# Patient Record
Sex: Female | Born: 1985 | Race: White | Hispanic: No | Marital: Married
Health system: Midwestern US, Community
[De-identification: ages and names within clinical notes are randomized; demographics above are authoritative.]

## PROBLEM LIST (undated history)

## (undated) DIAGNOSIS — Z3200 Encounter for pregnancy test, result unknown: Secondary | ICD-10-CM

## (undated) DIAGNOSIS — O020 Blighted ovum and nonhydatidiform mole: Secondary | ICD-10-CM

## (undated) DIAGNOSIS — Z01419 Encounter for gynecological examination (general) (routine) without abnormal findings: Secondary | ICD-10-CM

## (undated) MED ORDER — RHO D IMMUNE GLOBULIN 300 MCG IM SYRG
1500 unit (300 mcg) | INJECTION | Freq: Once | INTRAMUSCULAR | Status: AC
Start: ? — End: 2013-01-05

## (undated) MED ORDER — HYDROCODONE-ACETAMINOPHEN 5 MG-325 MG TAB
5-325 mg | ORAL_TABLET | ORAL | Status: DC | PRN
Start: ? — End: 2013-04-29

## (undated) MED ORDER — ONDANSETRON HCL 4 MG TAB
4 mg | ORAL_TABLET | ORAL | Status: DC | PRN
Start: ? — End: 2013-06-22

## (undated) MED ORDER — AZITHROMYCIN 250 MG TAB
250 mg | ORAL_TABLET | ORAL | Status: DC
Start: ? — End: 2012-11-11

## (undated) MED ORDER — PROGESTERONE MICRONIZED 200 MG CAP
200 mg | ORAL_CAPSULE | Freq: Every day | ORAL | Status: DC
Start: ? — End: 2013-06-22

## (undated) MED ORDER — RHO D IMMUNE GLOBULIN 300 MCG IM SYRG
1500 unit (300 mcg) | INJECTION | Freq: Once | INTRAMUSCULAR | Status: DC
Start: ? — End: 2013-01-05

---

## 2011-08-01 LAB — HM PAP SMEAR

## 2011-12-21 NOTE — Progress Notes (Addendum)
HISTORY OF PRESENT ILLNESS  Brittany Turner is a 26 y.o. female.  HPI Comments: 2 weeks  Daily    When lies down or sitting    At nighttime becomes aware of it and feels like heart is skipping      cut out caffiene  - lots of stress at job,  Getting married in November, bying a   House    Doesn't feel like she is overwhelmed    occasinal palpitations at night whiile trying to sleep and makes it harder to fall asleep.    New Patient  Pertinent negatives include no chest pain, no abdominal pain, no headaches and no shortness of breath.   Palpitations   Pertinent negatives include no diaphoresis, no fever, no malaise/fatigue, no chest pain, no claudication, no orthopnea, no PND, no abdominal pain, no nausea, no vomiting, no headaches, no back pain, no dizziness, no weakness, no cough and no shortness of breath.     History reviewed. No pertinent past medical history.  History reviewed. No pertinent past surgical history.  Family History   Problem Relation Age of Onset   ??? Cancer Brother      neuroblastoma age 103   ??? Cancer Maternal Grandfather      Brain Cancer   ??? Heart Attack Mother      ???   ??? Seizures Brother      History     Social History   ??? Marital Status: SINGLE     Spouse Name: N/A     Number of Children: N/A   ??? Years of Education: N/A     Occupational History   ??? [employer] - case worker      [employer]     Social History Main Topics   ??? Smoking status: Never Smoker    ??? Smokeless tobacco: Not on file   ??? Alcohol Use: No   ??? Drug Use: Not on file   ??? Sexually Active: Not on file     Other Topics Concern   ??? Caffeine Concern No   ??? Special Diet No     Mostly vegetarian   ??? Exercise Yes     Runs less since increased in things to do   ??? Seat Belt Yes     Social History Narrative   ??? No narrative on file       Allergies   Allergen Reactions   ??? Ceclor (Cefaclor) Rash   ??? Pcn (Penicillins) Rash     No current outpatient prescriptions on file.      Filed Vitals:    12/21/11 1018   BP: 120/70   Pulse: 76   Resp: 20    Height: 5\' 5"  (1.651 m)   Weight: 163 lb (73.936 kg)     Body mass index is 27.12 kg/(m^2).       Review of Systems   Constitutional: Negative for fever, chills, weight loss, malaise/fatigue and diaphoresis.   HENT: Negative for congestion.    Eyes: Negative for blurred vision.   Respiratory: Negative for cough and shortness of breath.    Cardiovascular: Positive for palpitations. Negative for chest pain, orthopnea, claudication, leg swelling and PND.   Gastrointestinal: Negative for heartburn, nausea, vomiting, abdominal pain, diarrhea, constipation and blood in stool.   Genitourinary: Negative for dysuria and frequency.   Musculoskeletal: Negative for myalgias, back pain and falls.   Skin: Negative for rash.   Neurological: Negative for dizziness, tingling, tremors, focal weakness, loss of consciousness,  weakness and headaches.   Endo/Heme/Allergies: Negative for environmental allergies and polydipsia. Does not bruise/bleed easily.   Psychiatric/Behavioral: Negative for depression, suicidal ideas and memory loss. The patient is not nervous/anxious and does not have insomnia.        Physical Exam   Nursing note and vitals reviewed.  Constitutional: She is oriented to person, place, and time. She appears well-developed and well-nourished. No distress.   HENT:   Head: Normocephalic and atraumatic.   Right Ear: External ear normal.   Left Ear: External ear normal.   Nose: Nose normal.   Mouth/Throat: Oropharynx is clear and moist.   Neck: Normal range of motion. Neck supple. No thyromegaly present.   Cardiovascular: Normal rate, regular rhythm, normal heart sounds and intact distal pulses.  Exam reveals no gallop and no friction rub.    No murmur heard.  Pulmonary/Chest: Effort normal and breath sounds normal. No respiratory distress. She has no wheezes. She has no rales.   Abdominal: Soft. Normal appearance and bowel sounds are normal. She exhibits no distension, no pulsatile liver, no ascites and no mass. There is  no splenomegaly or hepatomegaly. There is no tenderness. There is no rigidity, no rebound, no guarding and no CVA tenderness.   Musculoskeletal: Normal range of motion.   Lymphadenopathy:     She has no cervical adenopathy.   Neurological: She is alert and oriented to person, place, and time. She has normal reflexes. Coordination normal.   Skin: Skin is warm and dry. No rash noted. She is not diaphoretic.   Psychiatric: She has a normal mood and affect. Her behavior is normal. Judgment and thought content normal.       ASSESSMENT and PLAN  1. Heart palpitations  ELECTROCARDIOGRAM, COMPLETE   2. Malaise and fatigue  METABOLIC PANEL, COMPREHENSIVE, CBC WITH AUTOMATED DIFF, TSH, 3RD GENERATION     reviewed medications and side effects in detail  EKG: normal EKG, normal sinus rhythm.     Rate:  96    Impression: No acute changes / normal.     Sent to [facility] for a Holter Monitor.    Further workup and treatment should be done if symptoms persist, worsen or new symptoms occur. The patient will call to notify us of any problems, complications or worsening symptoms.    Follow-up Disposition: Not on File    There are no Patient Instructions on file for this visit.    Liana Gerold, MD    [facility]  [address]  [place] [address]  Phone [phone] ??? [phone]

## 2011-12-22 LAB — METABOLIC PANEL, COMPREHENSIVE
A-G Ratio: 1.8 (ref 1.1–2.5)
ALT (SGPT): 14 IU/L (ref 0–32)
AST (SGOT): 18 IU/L (ref 0–40)
Albumin: 4.6 g/dL (ref 3.5–5.5)
Alk. phosphatase: 51 IU/L (ref 25–150)
BUN/Creatinine ratio: 14 (ref 8–20)
BUN: 11 mg/dL (ref 6–20)
Bilirubin, total: 0.5 mg/dL (ref 0.0–1.2)
CO2: 22 mmol/L (ref 20–32)
Calcium: 9.4 mg/dL (ref 8.7–10.2)
Chloride: 102 mmol/L (ref 97–108)
Creatinine: 0.77 mg/dL (ref 0.57–1.00)
GFR est non-AA: 108 mL/min/{1.73_m2} (ref >59)
GLOBULIN, TOTAL: 2.5 g/dL (ref 1.5–4.5)
Glucose: 85 mg/dL (ref 65–99)
Potassium: 4.1 mmol/L (ref 3.5–5.2)
Protein, total: 7.1 g/dL (ref 6.0–8.5)
Sodium: 138 mmol/L (ref 134–144)
eGFR If African American: 124 mL/min/{1.73_m2} (ref >59)

## 2011-12-22 LAB — TSH 3RD GENERATION: TSH: 2 u[IU]/mL (ref 0.450–4.500)

## 2011-12-24 NOTE — Progress Notes (Signed)
Quick Note:    I have reviewed all lab results which are normal or stable. Please inform the patient.  Check CBC folder for results.  ______

## 2011-12-24 NOTE — Progress Notes (Signed)
Quick Note:    Per CBC reviewed by Dr. Alto Denver "normal"  ______

## 2011-12-25 NOTE — Progress Notes (Signed)
Quick Note:    Left message on patient voice mail with results. Advised patient to call back if assistance is needed.    ______

## 2011-12-27 NOTE — Telephone Encounter (Signed)
Called for results of Holter monitor.  Left message on [facility] voice mail to fax the results for dr. Alto Denver to review.

## 2011-12-27 NOTE — Telephone Encounter (Signed)
Patient called requesting results of test from Cardiac Holter Monitor.  Patient states she was seen by Dr. Alto Denver on 12/21/2011 and was prescribed a holter monitor for the weekend and was turned back in on Monday of this week.

## 2011-12-27 NOTE — Telephone Encounter (Signed)
[facility] - please call for results.

## 2011-12-31 NOTE — Telephone Encounter (Signed)
Pt again requesting results of holter monitor.

## 2011-12-31 NOTE — Telephone Encounter (Signed)
Haven't seen yet... Will look again tonite.

## 2012-01-01 NOTE — Telephone Encounter (Signed)
Per Dr. Alto Denver Holter report has been reviewed and marked " normal".  Advised to call back if pvc's worsen or continue to bothersome.

## 2012-06-20 NOTE — Progress Notes (Signed)
HISTORY OF PRESENT ILLNESS  Brittany Turner is a 26 y.o. female.       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  Other children area sick in that home    HPI Comments: Hard to breath at times - feels like congestion in chest    Started mucinex yesterday.    URI   The history is provided by the patient. This is a new problem. The current episode started 2 days ago. The problem has been gradually worsening. There has been no fever. Associated symptoms include congestion, headaches, rhinorrhea, sinus pain, sore throat and cough. Pertinent negatives include no chest pain, no abdominal pain, no diarrhea, no nausea, no vomiting, no dysuria, no ear pain, no plugged ear sensation, no sneezing, no swollen glands, no joint pain, no joint swelling, no neck pain, no rash and no wheezing. She has tried NSAIDs for the symptoms.       Review of Systems   Constitutional: Positive for malaise/fatigue. Negative for fever, chills and diaphoresis.   HENT: Positive for congestion, sore throat and rhinorrhea. Negative for ear pain, nosebleeds, sneezing, neck pain and ear discharge.    Eyes: Negative for pain and discharge.   Respiratory: Positive for cough and sputum production. Negative for hemoptysis, shortness of breath and wheezing.    Cardiovascular: Negative for chest pain, palpitations, orthopnea, claudication and leg swelling.   Gastrointestinal: Negative for heartburn, nausea, vomiting, abdominal pain, diarrhea, constipation and blood in stool.   Genitourinary: Negative for dysuria, urgency, frequency and hematuria.   Musculoskeletal: Negative for myalgias and joint pain.   Skin: Negative for rash.   Neurological: Positive for headaches. Negative for weakness.   Psychiatric/Behavioral: The patient does not have insomnia.           BP 114/70   Pulse 92   Temp(Src) 99.2 ??F (37.3 ??C) (Oral)   Resp 20   Ht 5\' 5"  (1.651 m)   Wt 175 lb (79.379 kg)   BMI 29.12 kg/m2   LMP 06/04/2012     Physical Exam   Nursing note and vitals reviewed.  Constitutional: She appears well-developed and well-nourished. No distress.   HENT:   Head: Normocephalic and atraumatic.   Right Ear: Hearing, tympanic membrane, external ear and ear canal normal.   Left Ear: Hearing, tympanic membrane, external ear and ear canal normal.   Nose: Nose normal.   Mouth/Throat: Uvula is midline, oropharynx is clear and moist and mucous membranes are normal.   Eyes: Conjunctivae and EOM are normal. Pupils are equal, round, and reactive to light.   Neck: Normal range of motion. Neck supple. No thyromegaly present.   Cardiovascular: Normal rate, regular rhythm, normal heart sounds and intact distal pulses.  Exam reveals no gallop and no friction rub.    No murmur heard.  Pulmonary/Chest: Effort normal. No accessory muscle usage. No respiratory distress. She has decreased breath sounds. She has no wheezes. She has no rhonchi. She has no rales.   Lymphadenopathy:     She has no cervical adenopathy.   Skin: Skin is warm and dry. She is not diaphoretic.   Psychiatric: She has a normal mood and affect. Her behavior is normal. Judgment and thought content normal.       ASSESSMENT and PLAN  1. Acute bronchitis  azithromycin (ZITHROMAX Z-PAK) 250 mg tablet     reviewed medications and side effects in detail    Further workup and treatment should be done  if symptoms persist, worsen or new symptoms occur. The patient will call to notify us of any problems, complications or worsening symptoms.    Follow-up Disposition: Not on File    There are no Patient Instructions on file for this visit.    Raye Sorrow, MD    [facility] [place] [address]  Phone [phone] ??? [phone]

## 2012-11-08 NOTE — Telephone Encounter (Signed)
After hours call: Pt had +UCG yesterday and c/o "bleeding like a period".  Pt denies, headache, SOB or visual disturbance.  Recommend bedrest and monitor bleeding, gave hemorrhage protocol.  If changing saturated pad < 1 hour x 2 hours pt should proceed to [facility] ED for evaluation, if bleeding persist on Monday pt should call for advise. Pt understands and agrees.

## 2012-11-10 NOTE — Progress Notes (Signed)
Spoke with patient on Saturday with +UCG and spotting.  Recommended pt call the office on Monday for advice.  Appointment was made for bloodwork today.  Quant hcg, Progesterone and Type and Rh ordered per Dr Mellody Dance.  Ultrasound scheduled for 11/11/12.

## 2012-11-11 ENCOUNTER — Encounter

## 2012-11-11 LAB — BETA HCG, QT
HCG, BETA, HCGTLT: 1 m[IU]/mL
HCG, beta, QT: 1 m[IU]/mL

## 2012-11-11 LAB — PROGESTERONE: Progesterone: 0.6 ng/mL

## 2012-11-11 LAB — BLOOD TYPE, (ABO+RH)
Rh (D): NEGATIVE
Rh Type: NEGATIVE

## 2012-11-11 NOTE — Progress Notes (Signed)
Name: Brittany Turner Date of Birth: 05/16/1986    DATE: 11/11/2012  Age: 27 y.o.       Annual Exam    Subjective:     Chief Complaint:    Chief Complaint   Patient presents with   ??? Spotting     eob       HPI: Brittany Turner is a 27 y.o. female G0 P0000 here today because she had a positive UPT and then had bleeding.  She had period March 27th and then nothing in April .  Took UPT and + then had bleeding.  Labs done and to follow up today - unfortunately HCG was negative. So will get AE which was due.     LMP: Patient's last menstrual period was 11/07/2012.      History   Sexual Activity   ??? Sexually Active: Yes -- Female partner(s)   ??? Birth Tax adviser: None     OB History    Grav Para Term Preterm Abortions TAB SAB Ect Mult Living    0                 No past medical history on file.PMH: neg    Past Surgical History   Procedure Laterality Date   ??? Hx wisdom teeth extraction       History     Social History   ??? Marital Status: MARRIED     Spouse Name: Trey Paula     Number of Children: N/A   ??? Years of Education: N/A     Occupational History   ??? [employer] - case worker      [employer]     Social History Main Topics   ??? Smoking status: Never Smoker    ??? Smokeless tobacco: Not on file   ??? Alcohol Use: No   ??? Drug Use: No   ??? Sexually Active: Yes -- Female partner(s)     Birth Control/ Protection: None     Other Topics Concern   ??? Caffeine Concern No   ??? Special Diet No     Mostly vegetarian   ??? Exercise Yes     Runs less since increased in things to do   ??? Seat Belt Yes     Social History Narrative    Never had abnormal pap    No STDs     Family History   Problem Relation Age of Onset   ??? Cancer Brother      neuroblastoma age 6   ??? Cancer Maternal Grandfather      Brain Cancer   ??? Heart Attack Mother      ???   ??? Seizures Brother        Allergies   Allergen Reactions   ??? Ceclor (Cefaclor) Rash   ??? Pcn (Penicillins) Rash     No current outpatient prescriptions on file prior to visit.     No current facility-administered  medications on file prior to visit.       Review of Systems:     Comprehensive written review of all systems was positive for weight gain    Objective:     Filed Vitals:    11/11/12 1520   BP: 136/90   Weight: 168 lb (76.204 kg)      Body mass index is 27.96 kg/(m^2).     PHYSICAL EXAM   General   WDWN, No Acute Distress; overweight   Skin   Moles - sees derm - just had mole taken  off back   Neck   No palpable masses; thyroid normal   Chest   CTA bilaterally; no wheezes, rhonchi, rales   Heart   Regular rate and rhythm; no significant murmur heard   Abdomen   Soft, Non-distended; spleen and liver normal   Extremities   No significant edema   Breast   No palpable masses; no nipple discharge or retraction   Ext. Gen Genitalia   Normal B/U/S ; no lesions   Vagina   negative   Cervix   negative   Uterus   Small, rv, nt   Adnexa   No masses   Rectal   deferred   Other          Assessment/Plan:     Encounter Diagnoses   Name Primary?   ??? Routine gynecological examination  Pap done  MVI or PNV daily  Elevated BP but very anxious    Could have been false +UPT vs chemical pregnancy.  Recommend no treatment  Ok to try again  Call with +UPT for quant to be done ASAP    Rh negative  Rhogam not indicated since HCG negative Yes       Follow-up Disposition:  Return in about 1 year (around 11/11/2013).      Signed By:  Adela Ports, MD

## 2012-11-11 NOTE — Progress Notes (Signed)
Gyn ult

## 2012-11-15 LAB — PAP (IMAGE GUIDED) W/REFL HPV ALL PATHOLOGY (507301)
.: 0
LABCORP 019018: 0

## 2012-11-15 NOTE — Progress Notes (Signed)
Quick Note:    Notify pap normal  ______

## 2012-12-02 ENCOUNTER — Telehealth

## 2012-12-02 NOTE — Progress Notes (Signed)
Pt presents for HCG quant and progesterone levels.

## 2012-12-02 NOTE — Telephone Encounter (Signed)
Pt states she had a chemical pregnancy last month & RAK told her to call for bloodwork when she had a positive pregnancy test. LMP 11/07/12 ([redacted]w[redacted]d). Per RAK, pt to come in for quant Hcg & Progesterone levels. appt scheduled for today.

## 2012-12-03 ENCOUNTER — Telehealth

## 2012-12-03 LAB — BETA HCG, QT
HCG, BETA, HCGTLT: 8 m[IU]/mL
HCG, beta, QT: 8 m[IU]/mL

## 2012-12-03 LAB — PROGESTERONE: Progesterone: 17.3 ng/mL

## 2012-12-03 NOTE — Telephone Encounter (Signed)
Spoke w/ pt & gave results. Will check w/ RAK in the am & call her back.

## 2012-12-03 NOTE — Telephone Encounter (Signed)
TC for 5/27 lab results. Hcg=8,  Progesterone = 17.3. (pt had a chemical pregnancy last month) did a recent positive PT.LMP 11/07/12 ([redacted]w[redacted]d).  Will need to check w/ RAK tomorrow am about results & when she wants Hcg level repeated. L/M for pt to call me.

## 2012-12-04 NOTE — Telephone Encounter (Signed)
Per RAK, pt to repeat quant Hcg either Monday 6/2 or Tues 6/3. L/M for pt to call for appt. Also called in Prometrium 200 mg qhs per RAK to [facility]-[phone].

## 2012-12-08 NOTE — Progress Notes (Signed)
Pt presents for HCG quant.

## 2012-12-09 ENCOUNTER — Telehealth

## 2012-12-09 LAB — BETA HCG, QT
HCG, BETA, HCGTLT: 60 m[IU]/mL
HCG, beta, QT: 60 m[IU]/mL

## 2012-12-09 NOTE — Telephone Encounter (Signed)
TC for quant Hcg results. 5/27=8,  6/2=60. Per RAK, the numbers are going up appropriately. She rec we repeat quant Hcg in 2-3 days. (Thurs or Fri) L/M for pt to call me for lab appt.  LMP 11/07/12 ([redacted]w[redacted]d) (order put into [facility])

## 2012-12-15 NOTE — Progress Notes (Signed)
Pt presents to office for quant HCG.

## 2012-12-16 LAB — BETA HCG, QT
HCG, BETA, HCGTLT: 337 m[IU]/mL
HCG, beta, QT: 337 m[IU]/mL

## 2012-12-16 NOTE — Telephone Encounter (Signed)
TC for Hcg results. 5/27=8,   6/2=60,   6/9=337. Results given to pt. Per RAK, pt to come in tomorrow for u/s and then to see her. Pt will come in at noon.

## 2012-12-17 NOTE — Progress Notes (Signed)
Name: Brittany Turner DOB: [birth year]  DATE: 12/17/2012    G/P: Brittany Turner P0000 Age: 27 y.o.  MRN: [id number]     Chief Complaint   Patient presents with   ??? Follow-up     eob ultasound     HPI:  + HCGs.  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  Had + UPT May 26th.  Have been following quants.  Have steadily increased -- 8, 60, 337.  She had had no pain and no bleeding. She has no GI symptoms.   Has been taking prometrium since first telephone call.     Allergies   Allergen Reactions   ??? Ceclor (Cefaclor) Rash   ??? Pcn (Penicillins) Rash     Current Outpatient Prescriptions   Medication Sig   ??? progesterone (PROMETRIUM) 200 mg capsule Take 1 Cap by mouth daily.   ??? PNV COMBO#47/IRON/FA #1/DHA (PNV-DHA PO) Take  by mouth.     No current facility-administered medications for this visit.     OB History    Grav Para Term Preterm Abortions TAB SAB Ect Mult Living    0                 Filed Vitals:    12/17/12 1325   BP: 118/60   Weight: 169 lb (76.658 kg)          PHYSICAL EXAM   General   WDWN   Abdomen   Soft, nontender, no masses   Extremities   negative   Breast   deferred   Ext. Genitalia   negative   Vagina   No blood, normal   Cervix   No lesions, nontender   Uterus   Slightly increased   Adnexa   No masses   Rectal   deferred   Other  Ultrasound: no IUP; no apparent ectopic; thickened endometrium; left adnexal cyst which likes a paratubal or paraovarian cyst; right ovary with small corpus luteal cyst     ASSESSMENT:    ICD-9-CM    1. Inappropriate change in quantitative hCG in early pregnancy 631.0 TOTAL HCG, QT.     PLAN:  ?? Recommend repeat HCG today  ?? Patient unsure if really wants to keep checking labs and ultrasounds; has no risk factors for ectopic; currently would see what HCG is and then consider repeat ultrasound on Monday (5 days); she was given strict ectopic precautions  ?? Call for bleeding - Rh negative and would need rhogam  ?? Continue with prometrium and PNV  ??

## 2012-12-18 LAB — BETA HCG, QT
HCG, BETA, HCGTLT: 357 m[IU]/mL
HCG, beta, QT: 357 m[IU]/mL

## 2012-12-18 NOTE — Progress Notes (Signed)
eob ult

## 2012-12-18 NOTE — Telephone Encounter (Signed)
Spoke w/ pt. She refused to come in for an appointment or u/s on Monday. Stated she will come in 2 weeks for repeat u/s & not before. Again gave strict ectopic precautions & to call or go to the ER over the weekend if she has any pain. Also discussed Rhogam needed if any bleeding. Will discuss w/ RAK on Monday when she returns.

## 2012-12-18 NOTE — Telephone Encounter (Signed)
TC for 6/11 Hcg results- 357. (6/3 Hcg=337)  Results have not gone up appropriately. Per RAK notes & after discussing w/ HAS as RAK is out of the office today & tomorrow, pt needs appt Monday 6/16 for repeat u/s & reck w/ RAK. appt scheduled for 11:30 am. L/M for pt to call me.  Will again stress ectopic precautions & to call if any bleeding as pt has blood type of A negative & would need Rhogam.

## 2012-12-22 NOTE — Progress Notes (Signed)
Quick Note:    Call Irving Burton. I know she does not want to repeat US now. But, her HCG is very abnormal. The numbers minimally changed from 6-9 to 6-11. This does NOT seem like a normal period. I recommend that she have another Ultrasound and see if we can identify a pregnancy site.  ______

## 2012-12-24 LAB — AMB POC URINE PREGNANCY TEST, VISUAL COLOR COMPARISON: HCG urine, Ql. (POC): POSITIVE

## 2012-12-24 NOTE — Progress Notes (Signed)
HISTORY OF PRESENT ILLNESS  Brittany Turner is a 27 y.o. female.  HPI  Patient presents today for a missed period. She has c/o allergies, fatigue and breast tenderness. She took a positive pregnancy test in the office today. LMP: 11/07/12. She was previously under the care of [facility]. 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 She began trying for another pregnancy and was followed closely for quants due to previous experience. Quants: 12/02/12=8, 12/08/12=60, 12/15/12=337, 12/17/12=357. She began taking progesterone on 12/03/12 and was ultrasounded on 12/17/12 due to abnormal rise in HCG. That ultrasound revealed a thickened endometrium but no IUP present. She was given strict bleeding and ectopic precautions. Per the patient, it was discussed that if this was an ectopic pregnancy that methotrexate could be administered. She presented to our office today for care because she decided she wanted a midwife. She desires a less medical approach that is not based on numbers and prefers to listen to her body. She states that due to her Catholic beliefs, she will not do methotrexate if this in fact an ectopic pregnancy. She does not desire any testing or ultrasound today.     Chief Complaint   Patient presents with   ??? Missed Menses       Allergies   Allergen Reactions   ??? Ceclor (Cefaclor) Rash   ??? Pcn (Penicillins) Rash     Current Outpatient Prescriptions   Medication Sig Dispense Refill   ??? progesterone (PROMETRIUM) 200 mg capsule Take 1 Cap by mouth daily.  30 Cap  4   ??? PNV COMBO#47/IRON/FA #1/DHA (PNV-DHA PO) Take  by mouth.       ??? HYDROcodone-acetaminophen (NORCO) 5-325 mg per tablet Take 1 Tab by mouth every four (4) hours as needed for Pain.  20 Tab  0   ??? ondansetron hcl (ZOFRAN) 4 mg tablet Take 1 Tab by mouth every four (4) hours as needed for Nausea.  20 Tab  0     History reviewed. No pertinent past medical history.   Past Surgical History   Procedure Laterality Date   ??? Hx wisdom teeth extraction       History     Social History   ??? Marital Status: MARRIED     Spouse Name: Trey Paula     Number of Children: N/A   ??? Years of Education: N/A     Occupational History   ??? [employer] - case worker      [employer]     Social History Main Topics   ??? Smoking status: Never Smoker    ??? Smokeless tobacco: Never Used   ??? Alcohol Use: No   ??? Drug Use: No   ??? Sexually Active: Yes -- Female partner(s)     Birth Control/ Protection: None     Other Topics Concern   ??? Caffeine Concern No   ??? Special Diet No     Mostly vegetarian   ??? Exercise Yes     Runs less since increased in things to do   ??? Seat Belt Yes     Social History Narrative    Never had abnormal pap    No STDs     Family History   Problem Relation Age of Onset   ??? Cancer Brother      neuroblastoma age 40   ??? Cancer Maternal Grandfather  Brain Cancer   ??? Heart Attack Mother      ???   ??? Seizures Brother      BP 118/78   Pulse 80   Ht 5\' 5"  (1.651 m)   Wt 170 lb (77.111 kg)   BMI 28.29 kg/m2   SpO2 96%   LMP 11/07/2012           Review of Systems   Constitutional: Positive for malaise/fatigue.        Allergies   HENT: Negative.    Eyes: Negative.    Respiratory: Negative.    Cardiovascular: Negative.    Gastrointestinal: Negative.    Genitourinary: Negative.         Breast tenderness   Musculoskeletal: Negative.    Skin: Negative.    Neurological: Negative.    Endo/Heme/Allergies: Negative.    Psychiatric/Behavioral: Negative.        Physical Exam   Nursing note and vitals reviewed.  Constitutional: She is oriented to person, place, and time. She appears well-developed and well-nourished. No distress.   HENT:   Head: Normocephalic and atraumatic.   Eyes: Right eye exhibits no discharge. Left eye exhibits no discharge. No scleral icterus.   Neck: Normal range of motion.   Cardiovascular: Normal rate.    Pulmonary/Chest: Effort normal. No respiratory distress.   Musculoskeletal: Normal range  of motion.   Neurological: She is alert and oriented to person, place, and time. She exhibits normal muscle tone. Coordination normal.   Skin: Skin is warm and dry. She is not diaphoretic.   Psychiatric: She has a normal mood and affect. Her behavior is normal. Judgment and thought content normal.       ASSESSMENT and PLAN  Greater than 50% of 30 minutes spent on face to face counseling and education regarding chemical pregnancies, miscarriages, and ectopic pregnancies. [name] has been counseled to seek medical attention for heavy bleeding (especially more than 1 pad in an hour), abdominal pain, fever, N/V, or other signs and symptoms that are not normal to a healthy pregnancy. She understands the risks of ectopic pregnancy or delayed care can result in hemorrhage, losing her fallopian tube, or death. She states that she feels well with "normal" pregnancy symptoms at this time and assures me that she will follow up if any of the above issues present. I did discuss with her that a follow up ultrasound is recommended. I do not need to repeat HCG levels at this time since the follow up for an abnormal HCG rise would be ultrasound. She states that she is comfortable having an ultrasound in one week and following up prior if needed. She will continue her progesterone.     Encounter Diagnoses   Name Primary?   ??? Missed period Yes   ??? History of miscarriage, currently pregnant      Orders Placed This Encounter   ??? AMB POC URINE PREGNANCY TEST, VISUAL COLOR COMPARISON     Follow-up Disposition:  Return in about 1 week (around 01/11/2013), or if symptoms worsen or fail to improve, for ultrasound and visit.

## 2012-12-24 NOTE — Patient Instructions (Addendum)
MyChart Activation    Thank you for requesting access to MyChart. Please follow the instructions below to securely access and download your online medical record. MyChart allows you to send messages to your doctor, view your test results, renew your prescriptions, schedule appointments, and more.    How Do I Sign Up?    1. In your internet browser, go to [email or web address]  2. Click on the First Time User? Click Here link in the Sign In box. You will be redirect to the New Member Sign Up page.  3. Enter your MyChart Access Code exactly as it appears below. You will not need to use this code after you???ve completed the sign-up process. If you do not sign up before the expiration date, you must request a new code.    MyChart Access Code: Not generated  Current MyChart Status: Active (This is the date your MyChart access code will expire)    4. Enter the last four digits of your Social Security Number (xxxx) and Date of Birth (mm/dd/yyyy) as indicated and click Submit. You will be taken to the next sign-up page.  5. Create a MyChart ID. This will be your MyChart login ID and cannot be changed, so think of one that is secure and easy to remember.  6. Create a MyChart password. You can change your password at any time.  7. Enter your Password Reset Question and Answer. This can be used at a later time if you forget your password.   8. Enter your e-mail address. You will receive e-mail notification when new information is available in MyChart.  9. Click Sign Up. You can now view and download portions of your medical record.  10. Click the Download Summary menu link to download a portable copy of your medical information.    Additional Information    If you have questions, please visit the Frequently Asked Questions section of the MyChart website at [email or web address] Remember, MyChart is NOT to be used for urgent needs. For medical emergencies, dial 911.      Pregnancy Precautions: After Your  Visit  Your Care Instructions  There is no sure way to prevent labor before your due date (preterm labor) or to prevent most other pregnancy problems. But there are things you can do to increase your chances of a healthy pregnancy. Go to your appointments, follow your doctor's advice, and take good care of yourself. Eat well, and exercise (if your doctor agrees). And make sure to drink plenty of water.  Follow-up care is a key part of your treatment and safety. Be sure to make and go to all appointments, and call your doctor if you are having problems. It's also a good idea to know your test results and keep a list of the medicines you take.  How can you care for yourself at home?  ?? Make sure you go to your prenatal appointments. At each visit, your doctor will check your blood pressure. Your doctor will also check to see if you have protein in your urine. High blood pressure and protein in urine are signs of preeclampsia. This condition can be dangerous for you and your baby.  ?? Drink plenty of fluids, enough so that your urine is light yellow or clear like water. Dehydration can cause contractions. If you have kidney, heart, or liver disease and have to limit fluids, talk with your doctor before you increase the amount of fluids you drink.  ?? Tell your doctor right away if you notice  any symptoms of an infection, such as:  ?? Burning when you urinate.  ?? A foul-smelling discharge from your vagina.  ?? Vaginal itching.  ?? Unexplained fever.  ?? Unusual pain or soreness in your uterus or lower belly.  ?? Eat a balanced diet. Include plenty of foods that are high in calcium and iron.  ?? Foods high in calcium include milk, cheese, yogurt, almonds, and broccoli.  ?? Foods high in iron include red meat, shellfish, poultry, eggs, beans, raisins, whole-grain bread, and leafy green vegetables.  ?? Do not smoke. If you need help quitting, talk to your doctor about stop-smoking programs and medicines. These can increase your  chances of quitting for good.  ?? Do not drink alcohol or use illegal drugs.  ?? Follow your doctor's directions about activity. Your doctor will let you know how much, if any, exercise you can do.  ?? Ask your doctor if you can have sex. If you are at risk for early labor, your doctor may ask you to not have sex.  ?? Take care to prevent falls. During pregnancy, your joints are loose, and your balance is off. Sports such as bicycling, skiing, or in-line skating can increase your risk of falling. And don't ride horses or motorcycles, dive, water ski, scuba dive, or parachute jump while you are pregnant.  ?? Avoid getting very hot. Do not use saunas or hot tubs. Avoid staying out in the sun in hot weather for long periods. Take acetaminophen (Tylenol) to lower a high fever.  ?? Do not take any over-the-counter or herbal medicines or supplements without talking to your doctor or pharmacist first.  When should you call for help?  Call 911 anytime you think you may need emergency care. For example, call if:  ?? You passed out (lost consciousness).  ?? You have severe vaginal bleeding.  ?? You have severe pain in your belly or pelvis.  ?? You have had fluid gushing or leaking from your vagina and you know or think the umbilical cord is bulging into your vagina. If this happens, immediately get down on your knees so your rear end (buttocks) is higher than your head. This will decrease the pressure on the cord until help arrives.  Call your doctor now or seek immediate medical care if:  ?? You have signs of preeclampsia, such as:  ?? Sudden swelling of your face, hands, or feet.  ?? New vision problems (such as dimness or blurring).  ?? A severe headache.  ?? You have any vaginal bleeding.  ?? You have belly pain or cramping.  ?? You have a fever.  ?? You have had regular contractions (with or without pain) for an hour. This means that you have 8 or more within 1 hour or 4 or more in 20 minutes after you change your position and drink  fluids.  ?? You have a sudden release of fluid from your vagina.  ?? You have low back pain or pelvic pressure that does not go away.  ?? You notice that your baby has stopped moving or is moving much less than normal.  Watch closely for changes in your health, and be sure to contact your doctor if you have any problems.   Where can you learn more?   Go to [email or web address]  Enter Y951 in the search box to learn more about "Pregnancy Precautions: After Your Visit."   ?? 2006-2014 [employer]. Care instructions adapted under license by [employer] (which disclaims liability  or warranty for this information). This care instruction is for use with your licensed healthcare professional. If you have questions about a medical condition or this instruction, always ask your healthcare professional. [employer], Incorporated disclaims any warranty or liability for your use of this information.  Content Version: 10.0.270728; Last Revised: Nov 16, 2011      Weeks 6 to 10 of Your Pregnancy: After Your Visit  Your Care Instructions  Congratulations on your pregnancy. This is an exciting and important time for you.  During the first 6 to 10 weeks of your pregnancy, your body goes through many changes. Your baby grows very fast, even though you cannot feel it yet. You may start to notice that you feel different, both in your body and your emotions. Because each woman's pregnancy is unique, there is no right way to feel. You may feel the healthiest you have ever been, or you may feel tired or sick to your stomach ("morning sickness").  These early weeks are a time to make healthy choices and to eat the best foods for you and your baby. This care sheet will give you some ideas.  This is also a good time to think about birth defects testing. These are tests done during pregnancy to look for possible problems with the baby. First trimester tests for birth defects can be done between 10 and 13 weeks of  pregnancy, depending on the test. Talk with your doctor about what kinds of tests are available.  Follow-up care is a key part of your treatment and safety. Be sure to make and go to all appointments, and call your doctor if you are having problems. It's also a good idea to know your test results and keep a list of the medicines you take.  How can you care for yourself at home?  Eat well  ?? Eat at least 3 meals and 2 healthy snacks every day. Eat fresh, whole foods, including:  ?? 7 or more servings of bread, tortillas, cereal, rice, pasta, or oatmeal.  ?? 3 or more servings of vegetables, especially leafy green vegetables.  ?? 2 or more servings of fruits.  ?? 3 or more servings of milk, yogurt, or cheese.  ?? 2 or more servings of meat, [place], chicken, fish, eggs, or dried beans.  ?? Drink plenty of fluids, especially water. Avoid sodas and other sweetened drinks.  ?? Choose foods that have important vitamins for your baby, such as calcium, iron, and folate.  ?? Dairy products, tofu, canned fish with bones, almonds, broccoli, dark leafy greens, corn tortillas, and fortified orange juice are good sources of calcium.  ?? Beef, poultry, liver, spinach, lentils, dried beans, fortified cereals, and dried fruits are rich in iron.  ?? Dark leafy greens, broccoli, asparagus, liver, fortified cereals, orange juice, peanuts, and almonds are good sources of folate.  ?? Avoid foods that could harm your baby.  ?? Do not eat raw or undercooked meat, chicken, or fish (such as sushi or raw oysters).  ?? Do not eat raw eggs or foods that contain raw eggs, such as Caesar dressing.  ?? Do not eat soft cheeses and unpasteurized dairy foods, such as Brie, feta, or blue cheese.  ?? Do not eat fish that contains a lot of mercury, such as shark, swordfish, tilefish, or king mackerel. Do not eat more than one small can of tuna each week.  ?? Do not eat raw sprouts, especially alfalfa sprouts.  ?? Cut down on caffeine, such as  coffee, tea, and cola.   Protect yourself and your baby  ?? Do not touch kitty litter or cat feces. They can cause an infection that could harm your baby.  ?? High body temperature can be harmful to your baby. So if you want to use a sauna or hot tub, be sure to talk to your doctor about how to use it safely.  Cope with morning sickness  ?? Sip small amounts of water, juices, or shakes. Try drinking between meals, not with meals.  ?? Eat 5 or 6 small meals a day. Try dry toast or crackers when you first get up, and eat breakfast a little later.  ?? Avoid spicy, greasy, and fatty foods.  ?? When you feel sick, open your windows or go for a short walk to get fresh air.  ?? Try nausea wristbands. These help some women.  ?? Tell your doctor if you think your prenatal vitamins make you sick.   Where can you learn more?   Go to [email or web address]  Enter G112 in the search box to learn more about "Weeks 6 to 10 of Your Pregnancy: After Your Visit."   ?? 2006-2014 [employer], [employer]. Care instructions adapted under license by [employer] (which disclaims liability or warranty for this information). This care instruction is for use with your licensed healthcare professional. If you have questions about a medical condition or this instruction, always ask your healthcare professional. [employer]wise, Incorporated disclaims any warranty or liability for your use of this information.  Content Version: 10.0.270728; Last Revised: January 18, 2011

## 2012-12-30 NOTE — Telephone Encounter (Signed)
Pt L/M stating she won't come in for an u/s & is transferring to another practice.

## 2012-12-30 NOTE — Telephone Encounter (Signed)
L/M again for pt. Per RAK, she strongly rec pt come in for another u/s as her Hcg levels are quite abnormal. L/M for pt to call for appt. Stressed importance of trying to identify pregnancy site.

## 2013-01-02 LAB — AMB POC URINE PREGNANCY TEST, VISUAL COLOR COMPARISON: HCG urine, Ql. (POC): POSITIVE

## 2013-01-02 NOTE — Patient Instructions (Addendum)
MyChart Activation    Thank you for requesting access to MyChart. Please follow the instructions below to securely access and download your online medical record. MyChart allows you to send messages to your doctor, view your test results, renew your prescriptions, schedule appointments, and more.    How Do I Sign Up?    1. In your internet browser, go to [email or web address]  2. Click on the First Time User? Click Here link in the Sign In box. You will be redirect to the New Member Sign Up page.  3. Enter your MyChart Access Code exactly as it appears below. You will not need to use this code after you???ve completed the sign-up process. If you do not sign up before the expiration date, you must request a new code.    MyChart Access Code: Not generated  Current MyChart Status: Active (This is the date your MyChart access code will expire)    4. Enter the last four digits of your Social Security Number (xxxx) and Date of Birth (mm/dd/yyyy) as indicated and click Submit. You will be taken to the next sign-up page.  5. Create a MyChart ID. This will be your MyChart login ID and cannot be changed, so think of one that is secure and easy to remember.  6. Create a MyChart password. You can change your password at any time.  7. Enter your Password Reset Question and Answer. This can be used at a later time if you forget your password.   8. Enter your e-mail address. You will receive e-mail notification when new information is available in MyChart.  9. Click Sign Up. You can now view and download portions of your medical record.  10. Click the Download Summary menu link to download a portable copy of your medical information.    Additional Information    If you have questions, please visit the Frequently Asked Questions section of the MyChart website at [email or web address] Remember, MyChart is NOT to be used for urgent needs. For medical emergencies, dial 911.    Blighted Ovum: After Your Visit  Your  Care Instructions  A blighted ovum occurs when a fertilized egg attaches to the inside of the uterus but does not develop into a baby. It is also known as an anembryonic pregnancy. It is usually caused by a mistake in the material of the egg or sperm or the combination of both. Stress, exercise, or sex does not cause this problem. There is nothing you could have done to prevent it.  You are likely to miscarry???pass the ovum tissue???by the end of your first trimester. Medicine may be used to help the tissue pass. A process called dilation and curettage (D&C) is sometimes used to remove the tissue.  Your body will recover over the next several weeks. Having a miscarriage does not mean that you cannot have a normal pregnancy in the future.  Follow-up care is a key part of your treatment and safety. Be sure to make and go to all appointments, and call your doctor if you are having problems. It???s also a good idea to know your test results and keep a list of the medicines you take.  How can you care for yourself at home?  ?? You will probably have vaginal bleeding, similar to a period, for up to a week. Use pads instead of tampons. You may use tampons during your next period, which should start in 3 to 6 weeks.  ?? Take an over-the-counter pain medicine, such as acetaminophen (  Tylenol), ibuprofen (Advil, Motrin), or naproxen (Aleve) for cramps. Read and follow all instructions on the label. You may have cramps for several days after the miscarriage.  ?? Do not take two or more pain medicines at the same time unless the doctor told you to. Many pain medicines have acetaminophen, which is Tylenol. Too much acetaminophen (Tylenol) can be harmful.  ?? Your doctor may want you to collect tissue that you might pass. Use a clear container. Take it to your doctor's office as soon as you can.  ?? Do not have sex until the bleeding stops.  ?? You may return to your normal activities if you feel well enough to do so. But you should avoid  heavy exercise until the bleeding stops.  ?? If you plan to get pregnant again, check with your doctor. Most doctors suggest waiting until you have had at least one normal period before you try to get pregnant.  ?? If you do not want to get pregnant, ask your doctor about birth control. You can get pregnant again before your next period starts if you are not using birth control.  ?? You may be low in iron because of blood loss. Eat a balanced diet that is high in iron and vitamin C. Foods rich in iron include red meat, shellfish, eggs, beans, and leafy green vegetables. Talk to your doctor about whether you need to take iron pills or a multivitamin.  ?? The loss of a pregnancy can be very hard. You may wonder why it happened and blame yourself. Talking to family members, friends, a Veterinary surgeon, or your doctor may help you cope with your loss.  When should you call for help?  Call 911 anytime you think you may need emergency care. For example, call if:  ?? You passed out (lost consciousness).  ?? You have sudden, severe pain in your belly or pelvis.  Call your doctor now or seek immediate medical care if:  ?? You have severe vaginal bleeding. You are passing clots of blood and soaking through your usual pads every hour for 2 or more hours.  ?? You are dizzy or lightheaded, or you feel like you may faint.  ?? You have a fever.  ?? You have new or increased belly pain.  ?? You have vaginal discharge that smells bad.  Watch closely for changes in your health, and be sure to contact your doctor if:  ?? You pass tissue (not just blood).  ?? You feel very sad or are not able to function.   Where can you learn more?   Go to [email or web address]  Enter [phone] in the search box to learn more about "Blighted Ovum: After Your Visit."   ?? 2006-2014 [employer], [employer]. Care instructions adapted under license by [employer] (which disclaims liability or warranty for this information). This care instruction is for use with  your licensed healthcare professional. If you have questions about a medical condition or this instruction, always ask your healthcare professional. [employer], [employer] disclaims any warranty or liability for your use of this information.  Content Version: 10.0.270728; Last Revised: May 23, 2012

## 2013-01-02 NOTE — Progress Notes (Signed)
HISTORY OF PRESENT ILLNESS  Brittany Turner is a 27 y.o. female.  HPI  Brittany Turner presents today with her husband for ultrasound to confirm status of current pregnancy. Brittany Turner had light vaginal bleeding last night and spoke via telephone with Dr. Wilhemina Bonito. She was given precautions when to go to the ER but her symptoms have not increased and she was able to wait until her visit today. Please see previous note from Jasper's last visit.     Chief Complaint   Patient presents with   ??? Abnormal Lab Results       Allergies   Allergen Reactions   ??? Ceclor (Cefaclor) Rash   ??? Pcn (Penicillins) Rash     Current Outpatient Prescriptions   Medication Sig Dispense Refill   ??? HYDROcodone-acetaminophen (NORCO) 5-325 mg per tablet Take 1 Tab by mouth every four (4) hours as needed for Pain.  20 Tab  0   ??? ondansetron hcl (ZOFRAN) 4 mg tablet Take 1 Tab by mouth every four (4) hours as needed for Nausea.  20 Tab  0   ??? progesterone (PROMETRIUM) 200 mg capsule Take 1 Cap by mouth daily.  30 Cap  4   ??? PNV COMBO#47/IRON/FA #1/DHA (PNV-DHA PO) Take  by mouth.         History reviewed. No pertinent past medical history.  Past Surgical History   Procedure Laterality Date   ??? Hx wisdom teeth extraction       History     Social History   ??? Marital Status: MARRIED     Spouse Name: Trey Paula     Number of Children: N/A   ??? Years of Education: N/A     Occupational History   ??? [employer] - case worker      [employer]     Social History Main Topics   ??? Smoking status: Never Smoker    ??? Smokeless tobacco: Never Used   ??? Alcohol Use: No   ??? Drug Use: No   ??? Sexually Active: Yes -- Female partner(s)     Birth Control/ Protection: None     Other Topics Concern   ??? Caffeine Concern No   ??? Special Diet No     Mostly vegetarian   ??? Exercise Yes     Runs less since increased in things to do   ??? Seat Belt Yes     Social History Narrative    Never had abnormal pap    No STDs     Family History   Problem Relation Age of Onset   ??? Cancer Brother      neuroblastoma age 64   ???  Cancer Maternal Grandfather      Brain Cancer   ??? Heart Attack Mother      ???   ??? Seizures Brother      BP 128/80   Pulse 103   Ht 5\' 5"  (1.651 m)   Wt 168 lb (76.204 kg)   BMI 27.96 kg/m2   SpO2 100%   LMP 11/07/2012           Review of Systems   Constitutional: Negative.    HENT: Negative.    Eyes: Negative.    Respiratory: Negative.    Cardiovascular: Negative.    Gastrointestinal: Negative.    Genitourinary: Negative.         Vaginal bleeding     Musculoskeletal: Negative.    Skin: Negative.    Neurological: Negative.    Endo/Heme/Allergies: Negative.  Psychiatric/Behavioral: Negative.        Physical Exam   Nursing note and vitals reviewed.  Constitutional: She is oriented to person, place, and time. She appears well-developed and well-nourished. No distress.   HENT:   Head: Normocephalic and atraumatic.   Eyes: Right eye exhibits no discharge. Left eye exhibits no discharge. No scleral icterus.   Neck: Normal range of motion.   Cardiovascular: Normal rate.    Pulmonary/Chest: Effort normal. No respiratory distress.   Abdominal: Soft. She exhibits no mass. There is no tenderness. There is no rebound and no guarding.   Genitourinary: Pelvic exam was performed with patient supine. Uterus is not deviated, not enlarged, not fixed and not tender. Right adnexum displays no mass, no tenderness and no fullness. Left adnexum displays no mass, no tenderness and no fullness. There is bleeding around the vagina. No erythema or tenderness around the vagina. No foreign body around the vagina. No signs of injury around the vagina. No vaginal discharge found.   Cervix closed   Musculoskeletal: Normal range of motion.   Neurological: She is alert and oriented to person, place, and time. She exhibits normal muscle tone. Coordination normal.   Skin: Skin is warm and dry. She is not diaphoretic.   Psychiatric: She has a normal mood and affect. Her behavior is normal. Judgment and thought content normal.       ASSESSMENT and  PLAN  Dorthie and her husband were counseled regarding the ultrasound findings. This is a blighted ovum pregnancy where the embryo did not develop. The gestational sac seen on ultrasound is not consistent with her previous HCG levels or her LMP which would make her 8w gestation. Due to the sac being low on the endometrium and vaginal bleeding already present, it is likely that Brittany Turner is in the process of miscarriage. She has been counseled that she can allow her body to miscarry on it's own or she can elect to have a D&C and that procedure was explained. She will see how she does throughout the weekend. Pain medication and nausea medication were prescribed and given to the patient. She is to contact me on Monday and let me know how she is doing. If she has not completed her miscarriage, she can elect to have a D&C at any point and she is aware of this. She was given the same precautions to seek care for saturating a pad each hour or unrelieved abdominal pain or signs of infection such as fever, N/V, etc. She and her husband were counseled that after a miscarriage is complete, she can elect to have a blood HCG level checked to ensure that it returns to zero. In rare instances, chorio-carcinoma can occur and not achieving an HCG of zero would raise suspicion for this. She has been advised to postpone trying for another pregnancy until her cycle resets over the next 1-3 months but Brittany Turner is Catholic and does not prevent conception.   Greater than 50% of 25 minutes spent on face to face counseling, education, and examining the patient regarding abnormal pregnancy findings listed above. Questions were encouraged and answered. This case was reviewed with Dr. Wilhemina Bonito and she spoke with the Brittany Turner and her husband prior to them leaving the office.      Encounter Diagnoses   Name Primary?   ??? Threatened abortion in early pregnancy Yes   ??? Missed menses      Orders Placed This Encounter   ??? AMB POC [place], TRANSVAGINAL   ???  AMB POC URINE  PREGNANCY TEST, VISUAL COLOR COMPARISON   ??? HYDROcodone-acetaminophen (NORCO) 5-325 mg per tablet   ??? ondansetron hcl (ZOFRAN) 4 mg tablet     Follow-up Disposition:  Return if symptoms worsen or fail to improve, for call on Monday.

## 2013-01-05 NOTE — Progress Notes (Signed)
Patient here for Rhogam injection, tolerated well. She states she had very heavy bleeding over the weekend and feels like she passed the pregnancy. Today she is bleeding but lighter more like a period. She will continue to monitor.

## 2013-01-22 ENCOUNTER — Encounter

## 2013-04-17 LAB — TSH 3RD GENERATION: TSH, External: 1.78

## 2013-04-17 LAB — HEMATOCRIT: Hct, External: 43.3

## 2013-04-17 LAB — PLATELET COUNT: Platelet cnt., External: 268

## 2013-04-17 LAB — HIV-1 AB WESTERN BLOT CONFIRM ONLY

## 2013-04-17 LAB — HEMOGLOBIN: Hgb, External: 14.2

## 2013-04-17 LAB — URINALYSIS W/O MICRO

## 2013-04-17 LAB — RPR

## 2013-04-17 LAB — BLOOD TYPE, (ABO+RH)
ABO,Rh: A NEG
TYPE, ABO & RH, EXTERNAL: A NEG

## 2013-04-17 LAB — HEPATITIS C AB: Hepatitis C Ab, External: NEGATIVE

## 2013-04-17 LAB — ANTIBODY SCREEN: Antibody screen, External: NEGATIVE

## 2013-04-17 LAB — RUBELLA AB, IGG: Rubella, External: IMMUNE

## 2013-04-17 LAB — HEPATITIS B SURFACE AG W/REFLEX: HBsAg, External: NEGATIVE

## 2013-04-17 LAB — HEPATITIS C ANTIBODY: HEPATITIS C AB,   EXT: NEGATIVE

## 2013-04-17 NOTE — Patient Instructions (Addendum)
Weeks 6 to 10 of Your Pregnancy: After Your Visit  Your Care Instructions     Congratulations on your pregnancy. This is an exciting and important time for you.  During the first 6 to 10 weeks of your pregnancy, your body goes through many changes. Your baby grows very fast, even though you cannot feel it yet. You may start to notice that you feel different, both in your body and your emotions. Because each woman's pregnancy is unique, there is no right way to feel. You may feel the healthiest you have ever been, or you may feel tired or sick to your stomach ("morning sickness").  These early weeks are a time to make healthy choices and to eat the best foods for you and your baby. This care sheet will give you some ideas.  This is also a good time to think about birth defects testing. These are tests done during pregnancy to look for possible problems with the baby. First trimester tests for birth defects can be done between 10 and 13 weeks of pregnancy, depending on the test. Talk with your doctor about what kinds of tests are available.  Follow-up care is a key part of your treatment and safety. Be sure to make and go to all appointments, and call your doctor if you are having problems. It's also a good idea to know your test results and keep a list of the medicines you take.  How can you care for yourself at home?  Eat well  ?? Eat at least 3 meals and 2 healthy snacks every day. Eat fresh, whole foods, including:  ?? 7 or more servings of bread, tortillas, cereal, rice, pasta, or oatmeal.  ?? 3 or more servings of vegetables, especially leafy green vegetables.  ?? 2 or more servings of fruits.  ?? 3 or more servings of milk, yogurt, or cheese.  ?? 2 or more servings of meat, turkey, chicken, fish, eggs, or dried beans.  ?? Drink plenty of fluids, especially water. Avoid sodas and other sweetened drinks.  ?? Choose foods that have important vitamins for your baby, such as calcium, iron, and folate.  ?? Dairy products,  tofu, canned fish with bones, almonds, broccoli, dark leafy greens, corn tortillas, and fortified orange juice are good sources of calcium.  ?? Beef, poultry, liver, spinach, lentils, dried beans, fortified cereals, and dried fruits are rich in iron.  ?? Dark leafy greens, broccoli, asparagus, liver, fortified cereals, orange juice, peanuts, and almonds are good sources of folate.  ?? Avoid foods that could harm your baby.  ?? Do not eat raw or undercooked meat, chicken, or fish (such as sushi or raw oysters).  ?? Do not eat raw eggs or foods that contain raw eggs, such as Caesar dressing.  ?? Do not eat soft cheeses and unpasteurized dairy foods, such as Brie, feta, or blue cheese.  ?? Do not eat fish that contains a lot of mercury, such as shark, swordfish, tilefish, or king mackerel. Do not eat more than one small can of tuna each week.  ?? Do not eat raw sprouts, especially alfalfa sprouts.  ?? Cut down on caffeine, such as coffee, tea, and cola.  Protect yourself and your baby  ?? Do not touch kitty litter or cat feces. They can cause an infection that could harm your baby.  ?? High body temperature can be harmful to your baby. So if you want to use a sauna or hot tub, be sure to talk   to your doctor about how to use it safely.  Cope with morning sickness  ?? Sip small amounts of water, juices, or shakes. Try drinking between meals, not with meals.  ?? Eat 5 or 6 small meals a day. Try dry toast or crackers when you first get up, and eat breakfast a little later.  ?? Avoid spicy, greasy, and fatty foods.  ?? When you feel sick, open your windows or go for a short walk to get fresh air.  ?? Try nausea wristbands. These help some women.  ?? Tell your doctor if you think your prenatal vitamins make you sick.   Where can you learn more?   Go to [email or web address]  Enter G112 in the search box to learn more about "Weeks 6 to 10 of Your Pregnancy: After Your Visit."   ?? 2006-2014 [employer]. Care  instructions adapted under license by [employer] (which disclaims liability or warranty for this information). This care instruction is for use with your licensed healthcare professional. If you have questions about a medical condition or this instruction, always ask your healthcare professional. [employer] disclaims any warranty or liability for your use of this information.  Content Version: 10.1.311062; Current as of: January 18, 2011              Learning About When to Call Your Doctor During Pregnancy (Up to 20 Weeks)  Your Care Instructions  It's common to have concerns about what might be a problem during pregnancy. Although most pregnant women don't have any serious problems, it's important to know when to call your doctor if you have certain symptoms.  These are general suggestions. Your doctor may give you some more information about when to call.  When to call your doctor (up to 20 weeks)  Call 911 anytime you think you may need emergency care. For example, call if:  ?? You passed out (lost consciousness).  Call your doctor now or seek immediate medical care if:  ?? You have a fever.  ?? You have vaginal bleeding.  ?? You are dizzy or lightheaded, or you feel like you may faint.  ?? You have symptoms of a urinary tract infection. These may include:  ?? Pain or burning when you urinate.  ?? A frequent need to urinate without being able to pass much urine.  ?? Pain in the flank, which is just below the rib cage and above the waist on either side of the back.  ?? Blood in your urine.  ?? You have belly pain.  ?? You think you are having contractions.  ?? You have a sudden release of fluid from your vagina.  Watch closely for changes in your health, and be sure to contact your doctor if:  ?? You have vaginal discharge that smells bad.  ?? You have other concerns about your pregnancy.  Follow-up care is a key part of your treatment and safety. Be sure to make and go to all appointments, and call your doctor if  you are having problems. It's also a good idea to know your test results and keep a list of the medicines you take.   Where can you learn more?   Go to [email or web address]  Enter [phone] in the search box to learn more about "Learning About When to Call Your Doctor During Pregnancy (Up to 20 Weeks)."   ?? 2006-2014 [employer]. Care instructions adapted under license by [employer] (which disclaims liability or warranty for this information). This care  instruction is for use with your licensed healthcare professional. If you have questions about a medical condition or this instruction, always ask your healthcare professional. [employer] disclaims any warranty or liability for your use of this information.  Content Version: 10.1.311062; Current as of: May 01, 2011

## 2013-04-29 NOTE — Patient Instructions (Signed)
Learning About When to Call Your Doctor During Pregnancy (Up to 20 Weeks)  Your Care Instructions  It's common to have concerns about what might be a problem during pregnancy. Although most pregnant women don't have any serious problems, it's important to know when to call your doctor if you have certain symptoms.  These are general suggestions. Your doctor may give you some more information about when to call.  When to call your doctor (up to 20 weeks)  Call 911 anytime you think you may need emergency care. For example, call if:  ?? You passed out (lost consciousness).  Call your doctor now or seek immediate medical care if:  ?? You have a fever.  ?? You have vaginal bleeding.  ?? You are dizzy or lightheaded, or you feel like you may faint.  ?? You have symptoms of a urinary tract infection. These may include:  ?? Pain or burning when you urinate.  ?? A frequent need to urinate without being able to pass much urine.  ?? Pain in the flank, which is just below the rib cage and above the waist on either side of the back.  ?? Blood in your urine.  ?? You have belly pain.  ?? You think you are having contractions.  ?? You have a sudden release of fluid from your vagina.  Watch closely for changes in your health, and be sure to contact your doctor if:  ?? You have vaginal discharge that smells bad.  ?? You have other concerns about your pregnancy.  Follow-up care is a key part of your treatment and safety. Be sure to make and go to all appointments, and call your doctor if you are having problems. It's also a good idea to know your test results and keep a list of the medicines you take.   Where can you learn more?   Go to [email or web address]  Enter [phone] in the search box to learn more about "Learning About When to Call Your Doctor During Pregnancy (Up to 20 Weeks)."   ?? 2006-2014 [employer]. Care instructions adapted under license by [employer] (which disclaims liability or warranty for this  information). This care instruction is for use with your licensed healthcare professional. If you have questions about a medical condition or this instruction, always ask your healthcare professional. Healthwise, Incorporated disclaims any warranty or liability for your use of this information.  Content Version: 10.1.311062; Current as of: May 01, 2011              Learning About Pregnancy  Your Care Instructions  Your health in the early weeks of your pregnancy is particularly important for your baby???s health. Take good care of yourself. Anything you do that harms your body can also harm your baby.  Make sure to go to all of your doctor appointments. Regular checkups will help keep you and your baby healthy.  Follow-up care is a key part of your treatment and safety. Be sure to make and go to all appointments, and call your doctor if you are having problems. It???s also a good idea to know your test results and keep a list of the medicines you take.  How can you care for yourself at home?  Diet  ?? Eat a balanced diet. Make sure your diet includes plenty of beans, peas, and leafy green vegetables.  ?? Do not skip meals or go for many hours without eating. If you are nauseated, try to eat a small, healthy snack  every 2 to 3 hours.  ?? Do not eat fish that has a high level of mercury, such as shark, swordfish, or mackerel. Do not eat more than one can of tuna each week.  ?? Drink plenty of fluids, enough so that your urine is light yellow or clear like water. If you have kidney, heart, or liver disease and have to limit fluids, talk with your doctor before you increase the amount of fluids you drink.  ?? Cut down on caffeine, such as coffee, tea, and cola.  ?? Do not drink alcohol, such as beer, wine, or hard liquor.  ?? Take a multivitamin that contains at least 400 micrograms (mcg) of folic acid to help prevent birth defects. Fortified cereal and whole wheat bread are good additional sources of folic acid.  ?? Increase  the calcium in your diet. Try to drink a quart of skim milk each day. You may also take calcium supplements and choose foods such as cheese and yogurt.  Lifestyle  ?? Make sure you go to your follow-up appointments.  ?? Get plenty of rest. You may be unusually tired while you are pregnant.  ?? Get at least 30 minutes of exercise on most days of the week. Walking is a good choice. If you have not exercised in the past, start out slowly. Take several short walks each day.  ?? Do not smoke. If you need help quitting, talk to your doctor about stop-smoking programs. These can increase your chances of quitting for good.  ?? Do not touch cat feces or litter boxes. Also, wash your hands after you handle raw meat, and fully cook all meat before you eat it. Wear gloves when you work in the yard or garden, and wash your hands well when you are done. Cat feces, raw or undercooked meat, and contaminated dirt can cause an infection that may harm your baby or lead to a miscarriage.  ?? Do not use saunas or hot tubs. Raising your body temperature may harm your baby.  ?? Avoid chemical fumes, paint fumes, or poisons.  ?? Do not use illegal drugs or alcohol.  Medicines  ?? Review all of your medicines with your doctor. Some of your routine medicines may need to be changed to protect your baby.  ?? Use acetaminophen (Tylenol) to relieve minor problems, such as a mild headache or backache or a mild fever with cold symptoms. Do not use nonsteroidal anti-inflammatory drugs (NSAIDs), such as ibuprofen (Advil, Motrin) or naproxen (Aleve), unless your doctor says it is okay.  ?? Do not take two or more pain medicines at the same time unless the doctor told you to. Many pain medicines have acetaminophen, which is Tylenol. Too much acetaminophen (Tylenol) can be harmful.  ?? Take your medicines exactly as prescribed. Call your doctor if you think you are having a problem with your medicine.  To manage morning sickness  ?? If you feel sick when you first  wake up, try eating a small snack (such as crackers) before you get out of bed. Allow some time to digest the snack, and then get out of bed slowly.  ?? Do not skip meals or go for long periods without eating. An empty stomach can make nausea worse.  ?? Eat small, frequent meals instead of three large meals each day.  ?? Drink plenty of fluids. Sports drinks, such as Gatorade or Powerade, are good choices.  ?? Eat foods that are high in protein but low in fat.  ??  If you are taking iron supplements, ask your doctor if they are necessary. Iron can make nausea worse.  ?? Avoid any smells, such as coffee, that make you feel sick.  ?? Get lots of rest. Morning sickness may be worse when you are tired.   Where can you learn more?   Go to [facility]  Enter [phone] in the search box to learn more about "Learning About Pregnancy."   ?? 2006-2014 [employer], [employer]. Care instructions adapted under license by [employer] (which disclaims liability or warranty for this information). This care instruction is for use with your licensed healthcare professional. If you have questions about a medical condition or this instruction, always ask your healthcare professional. [employer], Inc[employer] disclaims any warranty or liability for your use of this information.  Content Version: 10.1.311062; Current as of: Nov 23, 2011              Weeks 6 to 10 of Your Pregnancy: After Your Visit  Your Care Instructions     Congratulations on your pregnancy. This is an exciting and important time for you.  During the first 6 to 10 weeks of your pregnancy, your body goes through many changes. Your baby grows very fast, even though you cannot feel it yet. You may start to notice that you feel different, both in your body and your emotions. Because each woman's pregnancy is unique, there is no right way to feel. You may feel the healthiest you have ever been, or you may feel tired or sick to your stomach ("morning sickness").   These early weeks are a time to make healthy choices and to eat the best foods for you and your baby. This care sheet will give you some ideas.  This is also a good time to think about birth defects testing. These are tests done during pregnancy to look for possible problems with the baby. First trimester tests for birth defects can be done between 10 and 13 weeks of pregnancy, depending on the test. Talk with your doctor about what kinds of tests are available.  Follow-up care is a key part of your treatment and safety. Be sure to make and go to all appointments, and call your doctor if you are having problems. It's also a good idea to know your test results and keep a list of the medicines you take.  How can you care for yourself at home?  Eat well  ?? Eat at least 3 meals and 2 healthy snacks every day. Eat fresh, whole foods, including:  ?? 7 or more servings of bread, tortillas, cereal, rice, pasta, or oatmeal.  ?? 3 or more servings of vegetables, especially leafy green vegetables.  ?? 2 or more servings of fruits.  ?? 3 or more servings of milk, yogurt, or cheese.  ?? 2 or more servings of meat, [place], chicken, fish, eggs, or dried beans.  ?? Drink plenty of fluids, especially water. Avoid sodas and other sweetened drinks.  ?? Choose foods that have important vitamins for your baby, such as calcium, iron, and folate.  ?? Dairy products, tofu, canned fish with bones, almonds, broccoli, dark leafy greens, corn tortillas, and fortified orange juice are good sources of calcium.  ?? Beef, poultry, liver, spinach, lentils, dried beans, fortified cereals, and dried fruits are rich in iron.  ?? Dark leafy greens, broccoli, asparagus, liver, fortified cereals, orange juice, peanuts, and almonds are good sources of folate.  ?? Avoid foods that could harm your baby.  ??  Do not eat raw or undercooked meat, chicken, or fish (such as sushi or raw oysters).  ?? Do not eat raw eggs or foods that contain raw eggs, such as Caesar  dressing.  ?? Do not eat soft cheeses and unpasteurized dairy foods, such as Brie, feta, or blue cheese.  ?? Do not eat fish that contains a lot of mercury, such as shark, swordfish, tilefish, or king mackerel. Do not eat more than one small can of tuna each week.  ?? Do not eat raw sprouts, especially alfalfa sprouts.  ?? Cut down on caffeine, such as coffee, tea, and cola.  Protect yourself and your baby  ?? Do not touch kitty litter or cat feces. They can cause an infection that could harm your baby.  ?? High body temperature can be harmful to your baby. So if you want to use a sauna or hot tub, be sure to talk to your doctor about how to use it safely.  Cope with morning sickness  ?? Sip small amounts of water, juices, or shakes. Try drinking between meals, not with meals.  ?? Eat 5 or 6 small meals a day. Try dry toast or crackers when you first get up, and eat breakfast a little later.  ?? Avoid spicy, greasy, and fatty foods.  ?? When you feel sick, open your windows or go for a short walk to get fresh air.  ?? Try nausea wristbands. These help some women.  ?? Tell your doctor if you think your prenatal vitamins make you sick.   Where can you learn more?   Go to [facility]  Enter G112 in the search box to learn more about "Weeks 6 to 10 of Your Pregnancy: After Your Visit."   ?? 2006-2014 Healthwise, Incorporated. Care instructions adapted under license by [employer] (which disclaims liability or warranty for this information). This care instruction is for use with your licensed healthcare professional. If you have questions about a medical condition or this instruction, always ask your healthcare professional. Healthwise, Incorporated disclaims any warranty or liability for your use of this information.  Content Version: 10.1.311062; Current as of: January 18, 2011

## 2013-05-28 LAB — AMB POC URINALYSIS DIP STICK MANUAL W/ MICRO
Glucose (UA POC): NEGATIVE
Protein (UA POC): NEGATIVE mg/dL

## 2013-05-28 NOTE — Patient Instructions (Addendum)
MyChart Activation    Thank you for requesting access to MyChart. Please follow the instructions below to securely access and download your online medical record. MyChart allows you to send messages to your doctor, view your test results, renew your prescriptions, schedule appointments, and more.    How Do I Sign Up?    1. In your internet browser, go to [email or web address]  2. Click on the First Time User? Click Here link in the Sign In box. You will be redirect to the New Member Sign Up page.  3. Enter your MyChart Access Code exactly as it appears below. You will not need to use this code after you???ve completed the sign-up process. If you do not sign up before the expiration date, you must request a new code.    MyChart Access Code: Not generated  Current MyChart Status: Active (This is the date your MyChart access code will expire)    4. Enter the last four digits of your Social Security Number (xxxx) and Date of Birth (mm/dd/yyyy) as indicated and click Submit. You will be taken to the next sign-up page.  5. Create a MyChart ID. This will be your MyChart login ID and cannot be changed, so think of one that is secure and easy to remember.  6. Create a MyChart password. You can change your password at any time.  7. Enter your Password Reset Question and Answer. This can be used at a later time if you forget your password.   8. Enter your e-mail address. You will receive e-mail notification when new information is available in MyChart.  9. Click Sign Up. You can now view and download portions of your medical record.  10. Click the Download Summary menu link to download a portable copy of your medical information.    Additional Information    If you have questions, please visit the Frequently Asked Questions section of the MyChart website at [email or web address] Remember, MyChart is NOT to be used for urgent needs. For medical emergencies, dial 911.      Weeks 10 to 14 of Your Pregnancy:  After Your Visit  Your Care Instructions     By weeks 10 to 14 of your pregnancy, the placenta has formed inside your uterus. It is possible to hear your baby's heartbeat with a special ultrasound device. Your baby's eyes can and do move. The arms and legs can bend.  This is a good time to think about testing for birth defects. There are two types of tests: screening and diagnostic. Screening tests show the chance that a baby has a certain birth defect. They can't tell you for sure that your baby has a problem. Diagnostic tests show if a baby has a certain birth defect.  It's your choice whether to have these tests. You and your partner can talk to your doctor or midwife about birth defects tests.  Follow-up care is a key part of your treatment and safety. Be sure to make and go to all appointments, and call your doctor if you are having problems. It's also a good idea to know your test results and keep a list of the medicines you take.  How can you care for yourself at home?  Decide about tests  ?? You can have screening tests and diagnostic tests to check for birth defects. The decision to have a test for birth defects is personal. Think about your age, your chance of passing on a family disease, your need to know about any   problems, and what you might do after you have the test results.  ?? Triple or quadruple (quad) blood tests. These screening tests can be done between 15 and 20 weeks of pregnancy. They check the amounts of three or four substances in your blood. The doctor looks at these test results, along with your age and other factors, to find out the chance that your baby may have certain problems.  ?? Amniocentesis. This diagnostic test is used to look for chromosomal problems in the baby's cells. It can be done between 15 and 20 weeks of pregnancy, usually around week 16.  ?? Nuchal translucency test. This test uses ultrasound to measure the thickness of the area at the back of the baby's neck. An increase  in the thickness can be an early sign of Down syndrome.  Ease discomfort  ?? Slow down and take naps when you feel tired.  ?? If your emotions swing, talk to someone. Crying, anxiety, and concentration problems are common.  ?? If your gums bleed, try a softer toothbrush. If your gums are puffy and bleed a lot, see your dentist.  ?? If you feel dizzy:  ?? Get up slowly after sitting or lying down.  ?? Drink plenty of fluids.  ?? Eat small snacks to keep your blood sugar stable.  ?? Put your head between your legs as though you were tying your shoelaces.  ?? Lie down with your legs higher than your head. Use pillows to prop up your feet.  ?? If you have a headache:  ?? Lie down.  ?? Ask your partner or a good friend for a neck massage.  ?? Try cool cloths over your forehead or across the back of your neck.  ?? Use acetaminophen (Tylenol) for pain relief. Do not use nonsteroidal anti-inflammatory drugs (NSAIDs), such as ibuprofen (Advil, Motrin) or naproxen (Aleve), unless your doctor says it is okay.  ?? If you have a nosebleed, pinch your nose gently, and hold it for a short while. To prevent nosebleeds, try massaging a small dab of petroleum jelly, such as Vaseline, in your nostrils.  ?? If your nose is stuffed up, try saline (saltwater) nose sprays. Do not use decongestant sprays.  Care for your breasts  ?? Wear a bra that gives you good support.  ?? Know that changes in your breasts are normal.  ?? Your breasts may get larger and more tender. Tenderness usually gets better by 12 weeks.  ?? Your nipples may get darker and larger, and small bumps around your nipples may show more.  ?? The veins in your chest and breasts may show more.  ?? Don't worry about "toughening'" your nipples. Breast-feeding will naturally do this.   Where can you learn more?   Go to http://www.healthwise.net/[facility]  Enter E090 in the search box to learn more about "Weeks 10 to 14 of Your Pregnancy: After Your Visit."   ?? 2006-2014 Healthwise, Incorporated.  Care instructions adapted under license by [employer] (which disclaims liability or warranty for this information). This care instruction is for use with your licensed healthcare professional. If you have questions about a medical condition or this instruction, always ask your healthcare professional. Healthwise, Incorporated disclaims any warranty or liability for your use of this information.  Content Version: 10.2.346038; Current as of: December 10, 2012

## 2013-05-28 NOTE — Progress Notes (Signed)
HISTORY OF PRESENT ILLNESS  Brittany Turner is a 27 y.o. female.  HPI  Patient presents today for her 12w OB exam visit. Patient had a normal pap smear in May 2014 so no pap smear is needed today.      Patient's last menstrual period was 03/01/2013.  Estimated Date of Delivery: 12/06/13  Obstetric History    G3   P0   T0   P0   A2   TAB0   SAB2   E0   M0   L0      Name of Baby 1 Not recorded   ???  Outcome Date 11/11/12     GA 4w 0d          ???  Delivery Type Not recorded   ???  Apgar at 1 min. Not recorded at 5 min. Not recorded   ???  Living Not recorded   Name of Baby 2 Not recorded   ???  Outcome Date 01/04/13     GA 8w 0d          ???  Delivery Type Not recorded   ???  Apgar at 1 min. Not recorded at 5 min. Not recorded   ???  Living Not recorded   Name of Baby 3 Not recorded   ???  Outcome Date Not recorded GA Not recorded   ???  Delivery Type Not recorded   ???  Apgar at 1 min. Not recorded at 5 min. Not recorded   ???  Living Not recorded       Allergies:   Allergies   Allergen Reactions   ??? Ceclor [Cefaclor] Rash   ??? Pcn [Penicillins] Rash       Medication History:  Current Outpatient Prescriptions   Medication Sig Dispense Refill   ??? progesterone (PROMETRIUM) 200 mg capsule Take 1 Cap by mouth daily.  30 Cap  4   ??? PNV COMBO#47/IRON/FA #1/DHA (PNV-DHA PO) Take  by mouth.       ??? ondansetron hcl (ZOFRAN) 4 mg tablet Take 1 Tab by mouth every four (4) hours as needed for Nausea.  20 Tab  0       Past Medical History:  Past Medical History   Diagnosis Date   ??? Asthma      as child, outgrew       Past Surgical History:  Past Surgical History   Procedure Laterality Date   ??? Hx wisdom teeth extraction     ??? Hx colonoscopy  1997       Social History:  History     Social History   ??? Marital Status: MARRIED     Spouse Name: Trey Paula     Number of Children: N/A   ??? Years of Education: N/A     Occupational History   ??? [employer] - case worker      [employer]     Social History Main Topics   ??? Smoking status: Never Smoker    ??? Smokeless tobacco:  Never Used   ??? Alcohol Use: No   ??? Drug Use: No   ??? Sexually Active: Yes -- Female partner(s)     Birth Control/ Protection: None     Other Topics Concern   ??? Caffeine Concern No   ??? Special Diet No     Mostly vegetarian   ??? Exercise Yes     Runs less since increased in things to do   ??? Seat Belt Yes     Social  History Narrative    Never had abnormal pap    No STDs       Family History:  Family History   Problem Relation Age of Onset   ??? Cancer Brother      neuroblastoma age 80   ??? Cancer Maternal Grandfather      Brain Cancer   ??? Heart Attack Mother      ???   ??? Seizures Brother        BP 118/82   Pulse 103   Ht 5\' 5"  (1.651 m)   Wt 165 lb (74.844 kg)   BMI 27.46 kg/m2   SpO2 99%   LMP 03/01/2013  Review of Systems   Constitutional: Negative.    HENT: Negative.    Eyes: Negative.    Respiratory: Negative.    Cardiovascular: Negative.    Gastrointestinal: Negative.    Genitourinary: Negative.    Musculoskeletal: Negative.    Skin: Negative.    Neurological: Negative.    Endo/Heme/Allergies: Negative.    Psychiatric/Behavioral: Negative.        Physical Exam   Nursing note and vitals reviewed.  Constitutional: She is oriented to person, place, and time. She appears well-developed and well-nourished. She is active and cooperative.  Non-toxic appearance. She does not have a sickly appearance. She does not appear ill. No distress.   HENT:   Head: Normocephalic and atraumatic.   Eyes: Right eye exhibits no discharge. Left eye exhibits no discharge. No scleral icterus.   Neck: Normal range of motion.   Cardiovascular: Normal rate.    Pulmonary/Chest: Effort normal. No respiratory distress.   Genitourinary:   See ultrasound report   Musculoskeletal: Normal range of motion.   Neurological: She is alert and oriented to person, place, and time. Coordination and gait normal.   Skin: Skin is warm and dry. She is not diaphoretic.   Psychiatric: She has a normal mood and affect. Her speech is normal and behavior is normal. Judgment  and thought content normal. Cognition and memory are normal.       ASSESSMENT and PLAN  Partial previa will be re-evaluated at 20w ultrasound due to early gestation. I have counseled Zaniya on bleeding in pregnancy and pelvic rest if bleeding occurs. Continue with routine prenatal care.  Encounter Diagnoses   Name Primary?   ??? Supervision of normal first pregnancy Yes   ??? Encounter for routine screening for malformation using ultrasonics      Orders Placed This Encounter   ??? US OB < 14 WKS, 1ST GESTATION (16109)   ??? AMB POC URINALYSIS DIP STICK MANUAL W/ MICRO     Follow-up Disposition:  Return in about 4 weeks (around 06/25/2013) for 16 week OB visit.

## 2013-06-22 LAB — AMB POC URINALYSIS DIP STICK MANUAL W/ MICRO
Glucose (UA POC): NEGATIVE
Protein (UA POC): NEGATIVE mg/dL

## 2013-06-22 NOTE — Patient Instructions (Addendum)
Weeks 14 to 18 of Your Pregnancy: After Your Visit  Your Care Instructions     During this time, you may start to "show," so that you look pregnant to people around you. You may also notice some changes in your skin, such as itchy spots on your palms or acne on your face.  Your baby is now able to pass urine, and your baby's first stool (meconium) is starting to collect in his or her intestines. Hair is also beginning to grow on your baby's head.  At your next visit, between weeks 18 and 20, your doctor may do an ultrasound test. The test allows your doctor to check for certain problems. Your doctor can also tell the sex of your baby. This is a good time to think about whether you want to know whether your baby is a boy or a girl.  As your pregnancy moves along, it is common to worry or feel anxious. Your body is changing a lot. And you are thinking about giving birth, the health of your baby, and becoming a parent. You can learn to cope with any anxiety and stress you feel.  Follow-up care is a key part of your treatment and safety. Be sure to make and go to all appointments, and call your doctor if you are having problems. It's also a good idea to know your test results and keep a list of the medicines you take.  How can you care for yourself at home?  Reduce stress  ?? Ask for help with cooking and housekeeping.  ?? Figure out who or what causes your stress. Avoid these people or situations as much as possible.  ?? Relax every day. Taking 10- to 15-minute breaks can make a big difference. Take a walk, listen to music, or take a warm bath.  ?? Learn relaxation techniques at prenatal or yoga class. Or buy a relaxation tape.  ?? List your fears about having a baby and becoming a parent. Share the list with someone you trust. Decide which worries are really small, and try to let them go.  Exercise  ?? If you did not exercise much before pregnancy, start slowly. Walking is best. Pace yourself, and do a little more every  day.  ?? Brisk walking, easy jogging, low-impact aerobics, water aerobics, and yoga are good choices. Some sports, such as scuba diving, horseback riding, downhill skiing, gymnastics, and water skiing, are not a good idea.  ?? Try to do at least 2?? hours a week of moderate exercise, such as a fast walk. One way to do this is to be active 30 minutes a day, at least 5 days a week. It's fine to be active in blocks of 10 minutes or more throughout your day and week.  ?? Wear loose clothing. And wear shoes and a bra that provide good support.  ?? Warm up and cool down to start and finish your exercise.  ?? If you want to use weights, be sure to use light weights. They reduce stress on your joints.  Stay at the best weight for you  ?? Experts recommend that you gain about 1 pound a month during the first 3 months of your pregnancy.  ?? Experts recommend that you gain about 1 pound a week during your last 6 months of pregnancy, for a total weight gain of 25 to 35 pounds.  ?? If you are underweight, you will need to gain more weight (about 28 to 40 pounds).  ??   If you are overweight, you may not need to gain as much weight (about 15 to 25 pounds).  ?? If you are gaining weight too fast, use common sense. Exercise every day, and limit sweets, fast foods, and fats. Choose lean meats, fruits, and vegetables.  ?? If you are having twins or more, your doctor may refer you to a dietitian.   Where can you learn more?   Go to http://www.healthwise.net/[facility]  Enter I453 in the search box to learn more about "Weeks 14 to 18 of Your Pregnancy: After Your Visit."   ?? 2006-2014 Healthwise, Incorporated. Care instructions adapted under license by [place] (which disclaims liability or warranty for this information). This care instruction is for use with your licensed healthcare professional. If you have questions about a medical condition or this instruction, always ask your healthcare professional. Healthwise, Incorporated disclaims any  warranty or liability for your use of this information.  Content Version: 10.2.346038; Current as of: December 10, 2012

## 2013-06-22 NOTE — Progress Notes (Signed)
04/29/13  New OB education visit completed today. Prenatal lab results were reviewed with the patient and the follow up needed based on those results. Prenatal vitamin Rx was provided if needed. Complete personal, genetic, and familial history recorded and gestational age specific education provided. See prenatal checklist and Snapshot. Questions encouraged and answered. Follow up appointment scheduled for 12 week ultrasound and visit. Elle welcome bag given to patient.    Welcome bag includes information on conception to birth and changes during pregnancy, hospital classes offered, breastfeeding and lactation support, nausea and vomiting in pregnancy, hospital pre-registration form, contact information for [facility] providers, local resources during pregnancy, and samples of maternal/newborn products.

## 2013-06-22 NOTE — Progress Notes (Signed)
See prenatal flowsheet. Brittany Turner is feeling much better. Her nausea has resolved and her appetite has returned. Her weight has fluctuated over this past year with 2 SABs. She has lost weight in this first trimester although her lowest pre-pregnancy weight this year is only 1lb. difference. We will continue to monitor this through her pregnancy. She had a gender reveal ultrasound done for entertainment today. It's a BOY! They have a name picked out but it is a surprise. Brittany Turner admits that she is feeling fluttering with fetal movement. Denies complaints. Encouraged tour at [facility] and birth prep classes at their discretion. She will be traveling to [place] for the holidays. Precautions for travel given and letter that is included in this encounter. F/U in 4w for anatomy scan and visit.

## 2013-06-22 NOTE — Progress Notes (Signed)
[facility]      06/22/2013      RE: Brittany Turner      To Whom it May Concern:      This is to certify that Brittany Turner is currently in her second trimester of pregnancy. Please allow her to avoid any exposure to x-ray technology.     Please feel free to contact my office if you have any questions or concerns.  Thank you for your assistance in this matter.    Sincerely,          Richardson Landry, NP

## 2013-06-22 NOTE — Progress Notes (Signed)
DOS: 04/17/13  HISTORY OF PRESENT ILLNESS  Brittany Turner is a 27 y.o. female.  HPI  Brittany Turner presents today for a missed period visit. She has taken a positive home pregnancy test. This year she has had a chemical pregnancy and a blighted ovum. All notes are in connect care from both SABs.     Review of Systems   Constitutional: Positive for malaise/fatigue.   HENT: Negative.    Eyes: Negative.    Respiratory: Negative.    Cardiovascular: Negative.    Gastrointestinal: Positive for nausea.   Genitourinary:        Cramping   Musculoskeletal: Negative.    Skin: Negative.    Neurological: Negative.    Endo/Heme/Allergies: Negative.    Psychiatric/Behavioral: Negative.        Physical Exam   Nursing note and vitals reviewed.  Constitutional: She is oriented to person, place, and time. She appears well-developed and well-nourished. She is active and cooperative.  Non-toxic appearance. She does not have a sickly appearance. She does not appear ill. No distress.   HENT:   Head: Normocephalic and atraumatic.   Eyes: Right eye exhibits no discharge. Left eye exhibits no discharge. No scleral icterus.   Neck: Normal range of motion.   Cardiovascular: Normal rate.    Pulmonary/Chest: Effort normal. No respiratory distress.   Musculoskeletal: Normal range of motion.   Neurological: She is alert and oriented to person, place, and time. Coordination and gait normal.   Skin: Skin is warm and dry. She is not diaphoretic.   Psychiatric: She has a normal mood and affect. Her speech is normal and behavior is normal. Judgment and thought content normal. Cognition and memory are normal.       ASSESSMENT and PLAN  Healthy appearing pregnancy that is consistent with dates. Will not change EDD by LMP. FHR 134. Reviewed [place] with Ernesto and discussed the small subchorionic bleed. Insignificant in size at this time. Bleeding precautions given. [name] has progesterone 200mg  from previous pregnancy. She will resume taking this until 14w GA. Counseled  that progesterone will not prevent SAB if progesterone levels are not deficient but Monserratt would like to try it due to history. Reviewed side effects.  Pregnancy confirmed with ultrasound in office today after missed period due to history of SAB x2 and cramping. ROS reveals common discomforts of pregnancy. Reviewed anticipated EDD of 12/06/12 with patient based on LMP. Questions encouraged and answered. Reviewed plan of care for supervision of pregnancy. Patient will return for a new OB education visit after completing prenatal lab panel and/or additional labs based on medical history. Will review results of labs at [facility] education visit in 1-2 weeks. Encouraged taking a daily prenatal vitamin with folic acid and DHA. Samples provided. Handout given for OTC medications that we authorize in pregnancy. Patient instructed to notify us or seek medical attention for any abdominal pain, cramping and vaginal bleeding, fever, or vomiting other than mild pregnancy sickness.   Greater than 50% of 25 minutes spent on face to face counseling, education, and examining the patient regarding missed period, pregnancy dx and POC for follow up visits.    Encounter Diagnoses   Name Primary?   ??? Threatened miscarriage in early pregnancy Yes   ??? Supervision of other normal pregnancy, first trimester    ??? History of miscarriage, currently pregnant, first trimester      Orders Placed This Encounter   ??? CULTURE, URINE   ??? AMB POC [place], TRANSVAGINAL   ???  CBC WITH AUTOMATED DIFF   ??? HIV 1/2 AB, BY IMMUNOBLOT   ??? HEP B SURFACE AG   ??? HEPATITIS C AB   ??? RPR   ??? RUBELLA AB, IGG   ??? TSH, 3RD GENERATION   ??? TYPE, ABO & RH   ??? ANTIBODY SCREEN     Follow-up Disposition:  Return in about 2 weeks (around 05/01/2013) for new ob education visit.  reviewed medications and side effects in detail  radiology results and schedule of future radiology studies reviewed with patient

## 2013-06-23 NOTE — Progress Notes (Signed)
US for Gender Reveal

## 2013-07-09 NOTE — L&D Delivery Note (Signed)
LOW TRANSVERSE CESAREAN SECTION   FULL OP NOTE    PATIENT: Brittany Turner    MRN: [id number]    PREOPERATIVE DIAGNOSIS: Term pregnancy at 23 1/[redacted] weeks gestation, spontaneous labor, arrest of dilation, chorioamnionitis    POSTOPERATIVE DIAGNOSIS: Same    PROCEDURE:  Low transverse cesarean section    SURGEON: Laury Axon, MD    ANESTHESIA: Epidural    ESTIMATED BLOOD LOSS:   500    SPECIMENS: None, placenta sent for disposal    FINDINGS: Live vigorous female infant weighing 8 lbs 5 ounces with APGARS of 8, 9. Normal appearing appendix seen and palpated. Normal appearing uterus, ovaries and fallopian tubes BL. A small 1cm x 1cm paratubal cyst was noted on the fimbria of the left fallopian tube.    PROCEDURE IN DETAIL: Informed consent was obtained prior to proceeding to the operating room. The patient was taken to the operating room where spinal anesthesia was found to be adequate. Time out was done to confirm the operating procedure, surgeon, patient and site.  Once confirmed by the team, procedure was started. The patient was prepped and draped in the normal sterile fashion. A Pfannenstiel skin incision was made with a scalpel and carried down to the underlying fascia. The fascial incision was extended laterally bluntly. The rectus muscles were divided in the midline bluntly. The peritoneum was entered and extended bluntly with good visualization of the bladder. A bladder blade was then inserted.  A low transverse uterine incision was made with the scalpel and extended laterally with blunt finger dissection. The baby???s head was then delivered atraumatically. The shoulders and torso followed without difficulty. The cord was clamped and cut. The baby was handed off to the awaiting Neonatal Intensive Care Unit and respiratory staff. APGARS were noted to be 8 and 9 at one and five minutes respectively. Weight was found to be 3765 grams, 8 pounds 5 ounces.The placenta was then delivered spontaneously.  Pitocin was  started IV. The uterus was exteriorized and the uterus was cleared of all clots and debris. The uterine incision was closed in two layers; the first layer was a running locked layer of 0 Vicryl and the second layer was an imbricating layer of 0 Vicryl. A small extension immediately medial to the right corner of the hysterotomy was incorporated in the first layer. Good hemostasis was assured. Uterine tone was noted to be firm. Paracolic gutters were washed with warm normal saline and a normal appearing appendix was seen and palpated as stated above. The uterus was returned to the abdomen. The uterine incision was inspected again and found to have excellent hemostasis. The peritoneum was then closed with 2-0 Vicryl in a running fashion. Two figure of eights sutures were placed to reapproximate rectus muscles. The fascia was closed with 0-Vicryl in a running fashion. Good hemostasis was assured throughout. The subcuticular layers were reapproximated with 2-0 plain gut in a running fashion. The skin was closed with 4-0 monocryl on a Keith needle in a subcuticular fashion. The patient tolerated the procedure well. Sponge, lap, and needle counts were correct times two. The patient and infant were taken to the recovery in stable condition.         Delivery Summary    Patient: Brittany Turner MRN: [id number]  SSN: [id number]    Date of Birth: 15-Aug-1985  Age: 28 y.o.  Sex: female        Information for the patient's newborn:  Ailed, Defibaugh Boy [[id number]]  Labor Events:   Preterm Labor: No   Rupture Date: 12/13/2013   Rupture Time: 2:36 PM   Rupture Type: AROM   Amniotic Fluid Volume: Moderate      Amniotic Fluid Description: Clear    Induction: None       Augmentation: Oxytocin;AROM   Labor Events:       Cervical Ripening:     None     Delivery Events:  Estimated Blood Loss (ml): 54ml     Delivery Date: 12/14/2013    Delivery Time: 10:08 AM  Delivery Type: Low Transverse C-Section   Cesarean Section Delivery: Arrest of  dilatation          Incidence: Primary     Scheduled: Unscheduled       Sex:  Female    Gestational Age: [redacted]w[redacted]d  Delivery Clinician:  Laury Axon  Living Status: Yes  Delivery Location: OR              APGARS  One minute Five minutes Ten minutes   Skin Color: 0   1       Heart Rate: 2   2       Reflex Irritability: 2   2       Muscle Tone: 2   2       Respiration: 2   2       Total: 8  9        Presentation: Vertex    Position:  Occiput Posterior  Resuscitation Method:  Suctioning-bulb  Tactile Stimulation     Meconium Stained: Other (Comment)   terminal      Cord Information: 3 Vessels      Cord Events: None      Cord Blood Sent?:  Yes      Blood Gases Sent?:  Yes    Placenta:  Date: 12/14/2013   Time: 10:09 AM  Removal: Expressed    Appearance: Normal  Intact       Newborn Measurements:  Birth Weight: 8 lb 4.8 oz (3.765 kg)    Birth Length: 55.5 cm    Head Circumference: 38 cm    Chest Circumference: 33.5 cm    Abdominal Girth:      Other Providers:   [place] Obstetrician  Primary Nurse  Respiratory Therapist  Anesthesiologist  CRNA  Nurse Practitioner  Scrub Tech  Scrub Tech             Group B Strep:   Lab Results   Component Value Date/Time    GrBS negative 11/09/2013     Information for the patient's newborn:  Lee-Ann, Gal Boy [[id number]]     No results found for this basename: PCTABR, PCTDIG, BILI     Lab Results   Component Value Date/Time    APH 7.305 12/14/2013 10:08 AM    APCO2 54* 12/14/2013 10:08 AM    APO2 <10 12/14/2013 10:08 AM    AHCO3 26 12/14/2013 10:08 AM    ABDC 0.9 12/14/2013 10:08 AM    SITE CORD 12/14/2013 10:08 AM    RSCOM cc at 6 8 2015 10 23 39 AM. Not read back. 12/14/2013 10:08 AM

## 2013-07-20 LAB — AMB POC URINALYSIS DIP STICK MANUAL W/ MICRO: Glucose (UA POC): NEGATIVE

## 2013-07-20 LAB — AMB POC HEMOGLOBIN (HGB): Hemoglobin (POC): 13.7

## 2013-07-20 NOTE — Patient Instructions (Addendum)
MyChart Activation    Thank you for requesting access to MyChart. Please follow the instructions below to securely access and download your online medical record. MyChart allows you to send messages to your doctor, view your test results, renew your prescriptions, schedule appointments, and more.    How Do I Sign Up?    1. In your internet browser, go to [email or web address]  2. Click on the First Time User? Click Here link in the Sign In box. You will be redirect to the New Member Sign Up page.  3. Enter your MyChart Access Code exactly as it appears below. You will not need to use this code after you???ve completed the sign-up process. If you do not sign up before the expiration date, you must request a new code.    MyChart Access Code: Not generated  Current MyChart Status: Active (This is the date your MyChart access code will expire)    4. Enter the last four digits of your Social Security Number (xxxx) and Date of Birth (mm/dd/yyyy) as indicated and click Submit. You will be taken to the next sign-up page.  5. Create a MyChart ID. This will be your MyChart login ID and cannot be changed, so think of one that is secure and easy to remember.  6. Create a MyChart password. You can change your password at any time.  7. Enter your Password Reset Question and Answer. This can be used at a later time if you forget your password.   8. Enter your e-mail address. You will receive e-mail notification when new information is available in [place].  9. Click Sign Up. You can now view and download portions of your medical record.  10. Click the Download Summary menu link to download a portable copy of your medical information.    Additional Information    If you have questions, please visit the Frequently Asked Questions section of the MyChart website at [email or web address] Remember, MyChart is NOT to be used for urgent needs. For medical emergencies, dial 911.      Weeks 18 to 22 of Your Pregnancy:  After Your Visit  Your Care Instructions     Your baby is continuing to develop quickly. At this stage, babies can now suck their thumbs, grip firmly with their hands, and open and close their eyelids.  Sometime between 18 and 22 weeks, you will start to feel your baby move. At first, these small fetal movements feel like fluttering or "butterflies." Some women say that they feel like gas bubbles. As the baby grows, these movements will become stronger. You may also notice that your baby kicks and hiccups.  During this time, you may find that your nausea and fatigue are gone. Overall, you may feel better and have more energy than you did in your first trimester. But you may also have new discomforts now, such as sleep problems or leg cramps. This care sheet can help you ease these discomforts.  Follow-up care is a key part of your treatment and safety. Be sure to make and go to all appointments, and call your doctor if you are having problems. It's also a good idea to know your test results and keep a list of the medicines you take.  How can you care for yourself at home?  Ease sleep problems  ?? Avoid caffeine in drinks or chocolate late in the day.  ?? Get some exercise every day.  ?? Take a warm shower or bath before bed.  ?? Have  a light snack or glass of milk at bedtime.  ?? Do relaxation exercises in bed to calm your mind and body.  ?? Support your legs and back with extra pillows. Try a pillow between your legs if you sleep on your side.  ?? Do not use sleeping pills or alcohol. They could harm your baby.  Ease leg cramps  ?? Do not massage your calf during the cramp.  ?? Sit on a firm bed or chair. Straighten your leg, and bend your foot (flex your ankle) slowly upward, toward your knee. Bend your toes up and down.  ?? Stand on a cool, flat surface. Stretch your toes upward, and take small steps walking on your heels.  ?? Use a heating pad or hot water bottle to help with muscle ache.  Prevent leg cramps  ?? Cut back on  lunch meats, packaged foods, and carbonated beverages.  ?? Be sure to get enough calcium. If you are worried that you are not getting enough, talk to your doctor.  ?? Exercise every day, and stretch your legs before bed.  ?? Take a warm bath before bed, and try leg warmers at night.   Where can you learn more?   Go to [employer]  Enter [phone] in the search box to learn more about "Weeks 18 to 22 of Your Pregnancy: After Your Visit."   ?? 2006-2014 Healthwise, Incorporated. Care instructions adapted under license by [employer] (which disclaims liability or warranty for this information). This care instruction is for use with your licensed healthcare professional. If you have questions about a medical condition or this instruction, always ask your healthcare professional. [place] any warranty or liability for your use of this information.  Content Version: 10.2.346038; Current as of: December 10, 2012

## 2013-07-20 NOTE — Progress Notes (Signed)
See prenatal flowsheet. 20 week anatomy scan completed. No placenta previa/low lying placenta seen today. Routine f/u in 4 weeks.

## 2013-08-17 LAB — AMB POC URINALYSIS DIP STICK MANUAL W/O MICRO: Glucose (UA POC): NEGATIVE

## 2013-08-17 NOTE — Patient Instructions (Addendum)
MyChart Activation    Thank you for requesting access to MyChart. Please follow the instructions below to securely access and download your online medical record. MyChart allows you to send messages to your doctor, view your test results, renew your prescriptions, schedule appointments, and more.    How Do I Sign Up?    1. In your internet browser, go to [email or web address]  2. Click on the First Time User? Click Here link in the Sign In box. You will be redirect to the New Member Sign Up page.  3. Enter your MyChart Access Code exactly as it appears below. You will not need to use this code after you???ve completed the sign-up process. If you do not sign up before the expiration date, you must request a new code.    MyChart Access Code: Activation code not generated  Current MyChart Status: Active (This is the date your MyChart access code will expire)    4. Enter the last four digits of your Social Security Number (xxxx) and Date of Birth (mm/dd/yyyy) as indicated and click Submit. You will be taken to the next sign-up page.  5. Create a MyChart ID. This will be your MyChart login ID and cannot be changed, so think of one that is secure and easy to remember.  6. Create a MyChart password. You can change your password at any time.  7. Enter your Password Reset Question and Answer. This can be used at a later time if you forget your password.   8. Enter your e-mail address. You will receive e-mail notification when new information is available in [place].  9. Click Sign Up. You can now view and download portions of your medical record.  10. Click the Download Summary menu link to download a portable copy of your medical information.    Additional Information    If you have questions, please visit the Frequently Asked Questions section of the MyChart website at [email or web address] Remember, MyChart is NOT to be used for urgent needs. For medical emergencies, dial 911.      Weeks 22 to 26 of  Your Pregnancy: After Your Visit  Your Care Instructions     As you enter your 7th month of pregnancy at week 26, your baby's lungs are growing stronger and getting ready to breathe. You may notice that your baby responds to the sound of your or your partner's voice. You may also notice that your baby does less turning and twisting and more squirming or jerking. Jerking often means that your baby has the hiccups. Hiccups are perfectly normal and are only temporary.  You may want to think about attending a childbirth preparation class. This is also a good time to start thinking about whether you want to have pain medicine during labor.  Most pregnant women are tested for gestational diabetes between weeks 4 and 28. Gestational diabetes occurs when your blood sugar level gets too high when you're pregnant. The test is important, because you can have gestational diabetes and not know it. But the condition can cause problems for your baby.  Follow-up care is a key part of your treatment and safety. Be sure to make and go to all appointments, and call your doctor if you are having problems. It's also a good idea to know your test results and keep a list of the medicines you take.  How can you care for yourself at home?  Ease discomfort from your baby's kicking  ?? Change your position. Sometimes this will  cause your baby to change position too.  ?? Take a deep breath while you raise your arm over your head. Then breathe out while you drop your arm.  Do Kegel exercises to prevent urine from leaking  ?? You can do Kegel exercises while you stand or sit.  ?? Squeeze the same muscles you would use to stop your urine. Your belly and thighs should not move.  ?? Hold the squeeze for 3 seconds, and then relax for 3 seconds.  ?? Start with 3 seconds. Then add 1 second each week until you are able to squeeze for 10 seconds.  ?? Repeat the exercise 10 to 15 times for each session. Do three or more sessions each day.  Ease or reduce  swelling in your feet, ankles, hands, and fingers  ?? If your fingers are puffy, take off your rings.  ?? Do not eat high-salt foods, such as potato chips.  ?? Prop up your feet on a stool or couch as much as possible. Sleep with pillows under your feet.  ?? Do not stand for long periods of time or wear tight shoes.  ?? Wear support stockings.   Where can you learn more?   Go to [email or web address]  Enter G264 in the search box to learn more about "Weeks 22 to 26 of Your Pregnancy: After Your Visit."   ?? 2006-2014 Healthwise, Incorporated. Care instructions adapted under license by [employer] (which disclaims liability or warranty for this information). This care instruction is for use with your licensed healthcare professional. If you have questions about a medical condition or this instruction, always ask your healthcare professional. [place] any warranty or liability for your use of this information.  Content Version: 10.2.346038; Current as of: December 10, 2012

## 2013-08-17 NOTE — Progress Notes (Signed)
See prenatal flowsheet. Brittany Turner is doing well with no OB complaints. She is feeling good movement from baby. Return in 4 weeks for GTT, rhogam injection and antibody screen. Concerned about dehydration. She is consuming adequate amounts of water throughout the day and is diligent with this. Her urine is more concentrated in the am and she takes her PNV at night. Reassured that her urine appearance is not always a reflection of hydration. Encouraged that she is doing well and finally gaining weight.

## 2013-09-14 LAB — AMB POC URINALYSIS DIP STICK MANUAL W/O MICRO
Glucose (UA POC): NEGATIVE
Protein (UA POC): NEGATIVE mg/dL

## 2013-09-14 LAB — AMB POC HEMOGLOBIN (HGB): Hemoglobin (POC): 12.2

## 2013-09-14 MED ORDER — RHO D IMMUNE GLOBULIN 300 MCG IM SYRG
1500 unit (300 mcg) | INJECTION | Freq: Once | INTRAMUSCULAR | Status: AC
Start: 2013-09-14 — End: 2013-09-14

## 2013-09-14 NOTE — Progress Notes (Signed)
See prenatal flowsheet. Brittany Turner is doing well with no OB complaints. GTT done today. Antibody screen drawn today and rhogam given. She attended the LDR tour this past week and is scheduled for the childbirth prep class on 09/26/13. I have reminded her that she needs a MD visit and she will schedule her next visit with one of them. She does her visits every Monday so this has made it difficult for her to complete her MD visit since I am the only provider in the office on Monday.

## 2013-09-14 NOTE — Patient Instructions (Addendum)
MyChart Activation    Thank you for requesting access to MyChart. Please follow the instructions below to securely access and download your online medical record. MyChart allows you to send messages to your doctor, view your test results, renew your prescriptions, schedule appointments, and more.    How Do I Sign Up?    1. In your internet browser, go to [email or web address]  2. Click on the First Time User? Click Here link in the Sign In box. You will be redirect to the New Member Sign Up page.  3. Enter your MyChart Access Code exactly as it appears below. You will not need to use this code after you???ve completed the sign-up process. If you do not sign up before the expiration date, you must request a new code.    MyChart Access Code: Activation code not generated  Current MyChart Status: Active (This is the date your MyChart access code will expire)    4. Enter the last four digits of your Social Security Number (xxxx) and Date of Birth (mm/dd/yyyy) as indicated and click Submit. You will be taken to the next sign-up page.  5. Create a MyChart ID. This will be your MyChart login ID and cannot be changed, so think of one that is secure and easy to remember.  6. Create a MyChart password. You can change your password at any time.  7. Enter your Password Reset Question and Answer. This can be used at a later time if you forget your password.   8. Enter your e-mail address. You will receive e-mail notification when new information is available in MyChart.  9. Click Sign Up. You can now view and download portions of your medical record.  10. Click the Download Summary menu link to download a portable copy of your medical information.    Additional Information    If you have questions, please visit the Frequently Asked Questions section of the MyChart website at [email or web address] Remember, MyChart is NOT to be used for urgent needs. For medical emergencies, dial 911.      Weeks 30 to 32 of  Your Pregnancy: After Your Visit  Your Care Instructions     You have made it to the final months of your pregnancy. By now, your baby is really starting to look like a baby, with hair and plump skin.  As you enter the final weeks of pregnancy, the reality of having a baby may start to set in. This is the time to settle on a name, get your household in order, set up a safe nursery, and find quality child care if needed. Doing these things in advance will allow you to focus on caring for and enjoying your new baby. You may also want to have a tour of your hospital's labor and delivery unit to get a better idea of what to expect while you are in the hospital.  During these last months, it is very important to take good care of yourself and pay attention to what your body needs. If your doctor says it is okay for you to work, don't push yourself too hard. Use the tips provided in this care sheet to ease heartburn and care for varicose veins.  If you haven't already had the Tdap shot during this pregnancy, talk to your doctor about getting it. It will help protect your newborn against pertussis infection.  Follow-up care is a key part of your treatment and safety. Be sure to make and go to all appointments, and   call your doctor if you are having problems. It's also a good idea to know your test results and keep a list of the medicines you take.  How can you care for yourself at home?  Pay attention to your baby's movements  ?? You should feel your baby move several times every day.  ?? Your baby now turns less, and kicks and jabs more.  ?? Your baby sleeps 20 to 45 minutes at a time and is more active at certain times of day.  ?? If your doctor wants you to count your baby's kicks:  ?? Empty your bladder, and lie on your side or relax in a comfortable chair.  ?? Write down your start time.  ?? Pay attention only to your baby's movements. Count any movement except hiccups.  ?? After you have counted 10 movements, write down your  stop time.  ?? Write down how many minutes it took for your baby to move 10 times.  ?? If an hour goes by and you have not recorded 10 movements, have something to eat or drink and then count for another hour. If you do not record 10 movements in either hour, call your doctor.  Ease heartburn  ?? Eat small, frequent meals.  ?? Do not eat chocolate, peppermint, or very spicy foods. Avoid drinks with caffeine, such as coffee, tea, and sodas.  ?? Avoid bending over or lying down after meals.  ?? Talk a short walk after you eat.  ?? If heartburn is a problem at night, do not eat for 2 hours before bedtime.  ?? Take antacids like Mylanta, Maalox, Rolaids, or Tums. Do not take antacids that have sodium bicarbonate.  Care for varicose veins  ?? Varicose veins are blood vessels that stretch out with the extra blood during pregnancy. Your legs may ache or throb. Most varicose veins will go away after the birth.  ?? Avoid standing for long periods of time. Sit with your legs crossed at the ankles, not the knees.  ?? Sit with your feet propped up.  ?? Avoid tight clothing or stockings. Wear support hose.  ?? Exercise regularly. Try walking for at least 30 minutes a day.   Where can you learn more?   Go to http://www.healthwise.net/[facility]  Enter X471 in the search box to learn more about "Weeks 30 to 32 of Your Pregnancy: After Your Visit."   ?? 2006-2014 Healthwise, Incorporated. Care instructions adapted under license by [place] (which disclaims liability or warranty for this information). This care instruction is for use with your licensed healthcare professional. If you have questions about a medical condition or this instruction, always ask your healthcare professional. Healthwise, Incorporated disclaims any warranty or liability for your use of this information.  Content Version: 10.2.346038; Current as of: December 10, 2012

## 2013-09-30 LAB — AMB POC URINALYSIS DIP STICK MANUAL W/O MICRO
Glucose (UA POC): NEGATIVE
Protein (UA POC): NEGATIVE mg/dL

## 2013-09-30 NOTE — Patient Instructions (Addendum)
Weeks 30 to 32 of Your Pregnancy: After Your Visit  Your Care Instructions     You have made it to the final months of your pregnancy. By now, your baby is really starting to look like a baby, with hair and plump skin.  As you enter the final weeks of pregnancy, the reality of having a baby may start to set in. This is the time to settle on a name, get your household in order, set up a safe nursery, and find quality child care if needed. Doing these things in advance will allow you to focus on caring for and enjoying your new baby. You may also want to have a tour of your hospital's labor and delivery unit to get a better idea of what to expect while you are in the hospital.  During these last months, it is very important to take good care of yourself and pay attention to what your body needs. If your doctor says it is okay for you to work, don't push yourself too hard. Use the tips provided in this care sheet to ease heartburn and care for varicose veins.  If you haven't already had the Tdap shot during this pregnancy, talk to your doctor about getting it. It will help protect your newborn against pertussis infection.  Follow-up care is a key part of your treatment and safety. Be sure to make and go to all appointments, and call your doctor if you are having problems. It's also a good idea to know your test results and keep a list of the medicines you take.  How can you care for yourself at home?  Pay attention to your baby's movements  ?? You should feel your baby move several times every day.  ?? Your baby now turns less, and kicks and jabs more.  ?? Your baby sleeps 20 to 45 minutes at a time and is more active at certain times of day.  ?? If your doctor wants you to count your baby's kicks:  ?? Empty your bladder, and lie on your side or relax in a comfortable chair.  ?? Write down your start time.  ?? Pay attention only to your baby's movements. Count any movement except hiccups.  ?? After you have counted 10  movements, write down your stop time.  ?? Write down how many minutes it took for your baby to move 10 times.  ?? If an hour goes by and you have not recorded 10 movements, have something to eat or drink and then count for another hour. If you do not record 10 movements in either hour, call your doctor.  Ease heartburn  ?? Eat small, frequent meals.  ?? Do not eat chocolate, peppermint, or very spicy foods. Avoid drinks with caffeine, such as coffee, tea, and sodas.  ?? Avoid bending over or lying down after meals.  ?? Talk a short walk after you eat.  ?? If heartburn is a problem at night, do not eat for 2 hours before bedtime.  ?? Take antacids like Mylanta, Maalox, Rolaids, or Tums. Do not take antacids that have sodium bicarbonate.  Care for varicose veins  ?? Varicose veins are blood vessels that stretch out with the extra blood during pregnancy. Your legs may ache or throb. Most varicose veins will go away after the birth.  ?? Avoid standing for long periods of time. Sit with your legs crossed at the ankles, not the knees.  ?? Sit with your feet propped up.  ??   Avoid tight clothing or stockings. Wear support hose.  ?? Exercise regularly. Try walking for at least 30 minutes a day.   Where can you learn more?   Go to http://www.healthwise.net/[facility]  Enter X471 in the search box to learn more about "Weeks 30 to 32 of Your Pregnancy: After Your Visit."   ?? 2006-2015 Healthwise, Incorporated. Care instructions adapted under license by [employer] (which disclaims liability or warranty for this information). This care instruction is for use with your licensed healthcare professional. If you have questions about a medical condition or this instruction, always ask your healthcare professional. Healthwise, Incorporated disclaims any warranty or liability for your use of this information.  Content Version: 10.4.390249; Current as of: May 22, 2013

## 2013-09-30 NOTE — Progress Notes (Signed)
See prenatal flowsheet. No OB complaints. Reviewed PTL precautions and kick counts with routine OB counseling.

## 2013-10-12 LAB — AMB POC URINALYSIS DIP STICK MANUAL W/O MICRO
Glucose (UA POC): NEGATIVE
Protein (UA POC): NEGATIVE mg/dL

## 2013-10-12 NOTE — Patient Instructions (Addendum)
MyChart Activation    Thank you for requesting access to MyChart. Please follow the instructions below to securely access and download your online medical record. MyChart allows you to send messages to your doctor, view your test results, renew your prescriptions, schedule appointments, and more.    How Do I Sign Up?    1. In your internet browser, go to [email or web address]  2. Click on the First Time User? Click Here link in the Sign In box. You will be redirect to the New Member Sign Up page.  3. Enter your MyChart Access Code exactly as it appears below. You will not need to use this code after you???ve completed the sign-up process. If you do not sign up before the expiration date, you must request a new code.    MyChart Access Code: Activation code not generated  Current MyChart Status: Active (This is the date your MyChart access code will expire)    4. Enter the last four digits of your Social Security Number (xxxx) and Date of Birth (mm/dd/yyyy) as indicated and click Submit. You will be taken to the next sign-up page.  5. Create a MyChart ID. This will be your MyChart login ID and cannot be changed, so think of one that is secure and easy to remember.  6. Create a MyChart password. You can change your password at any time.  7. Enter your Password Reset Question and Answer. This can be used at a later time if you forget your password.   8. Enter your e-mail address. You will receive e-mail notification when new information is available in MyChart.  9. Click Sign Up. You can now view and download portions of your medical record.  10. Click the Download Summary menu link to download a portable copy of your medical information.    Additional Information    If you have questions, please visit the Frequently Asked Questions section of the MyChart website at [email or web address] Remember, MyChart is NOT to be used for urgent needs. For medical emergencies, dial 911.      Weeks 32 to 34 of  Your Pregnancy: After Your Visit  Your Care Instructions     During the last few weeks of your pregnancy, you may have more aches and pains. It's important to rest when you can.  Your growing baby is putting more pressure on your bladder. So you may need to urinate more often. Hemorrhoids are also common. These are painful, itchy veins in the rectal area.  In the 36th week, most women have a test for group B streptococcus (GBS). GBS is a common bacteria that can live in the vagina and rectum. It can make your baby sick after birth. If you test positive, you will get antibiotics during labor. These will keep your baby from getting the bacteria.  You may want to talk with your doctor about banking your baby's umbilical cord blood. This is the blood left in the cord after birth. If you want to save this blood, you must arrange it ahead of time. You can't decide at the last minute.  If you haven't already had the Tdap shot during this pregnancy, talk to your doctor about getting it. It will help protect your newborn against pertussis infection.  Follow-up care is a key part of your treatment and safety. Be sure to make and go to all appointments, and call your doctor if you are having problems. It's also a good idea to know your test results and keep a   list of the medicines you take.  How can you care for yourself at home?  Ease hemorrhoids  ?? Get more liquids, fruits, vegetables, and fiber in your diet. This will help keep your stools soft.  ?? Avoid sitting for too long. Lie on your left side several times a day.  ?? Clean yourself with soft, moist toilet paper. Or you can use witch hazel pads or personal hygiene pads.  ?? If you are uncomfortable, try ice packs. Or you can sit in a warm sitz bath. Do these for 20 minutes at a time, as needed.  ?? Use hydrocortisone cream for pain and itching. Two examples are Anusol and Preparation H Hydrocortisone.  ?? Ask your doctor about taking an over-the-counter stool softener.   Consider breast-feeding  ?? Experts recommend that women breast-feed for 1 year or longer. Breast milk is the perfect food for babies.  ?? Breast milk is easier for babies to digest than formula. And it is always available, just the right temperature, and free.  ?? In general, babies who are breast-fed are healthier than formula-fed babies.  ?? Breast-fed babies are less likely to get ear infections, colds, diarrhea, and pneumonia.  ?? Breast-fed babies who are fed only breast milk are less likely to get asthma and allergies.  ?? Breast-fed babies are less likely to be obese.  ?? Breast-fed babies are less likely to get diabetes or heart disease.  ?? Women who breast-feed have less bleeding after the birth. Their uteruses also shrink back faster.  ?? Some women who breast-feed lose weight faster. Making milk burns calories.  ?? Breast-feeding can lower your risk of breast cancer, ovarian cancer, and osteoporosis.  Decide about circumcision for boys  ?? As you make this decision, it may help to think about your personal, religious, and family traditions. You get to decide if you will keep your son's penis natural or if he will be circumcised.  ?? If you decide that you would like to have your baby circumcised, talk with your doctor. You can share your concerns about pain. And you can discuss your preferences for anesthesia.   Where can you learn more?   Go to http://www.healthwise.net/[facility]  Enter X711 in the search box to learn more about "Weeks 32 to 34 of Your Pregnancy: After Your Visit."   ?? 2006-2015 Healthwise, Incorporated. Care instructions adapted under license by [place] (which disclaims liability or warranty for this information). This care instruction is for use with your licensed healthcare professional. If you have questions about a medical condition or this instruction, always ask your healthcare professional. Healthwise, Incorporated disclaims any warranty or liability for your use of this information.   Content Version: 10.4.390249; Current as of: May 22, 2013

## 2013-10-12 NOTE — Progress Notes (Signed)
See prenatal flowsheet. Brittany Turner is doing well with no OB complaints. Routine f/u in 2 weeks.

## 2013-10-26 LAB — AMB POC URINALYSIS DIP STICK MANUAL W/O MICRO: Glucose (UA POC): NEGATIVE

## 2013-10-26 NOTE — Patient Instructions (Addendum)
MyChart Activation    Thank you for requesting access to MyChart. Please follow the instructions below to securely access and download your online medical record. MyChart allows you to send messages to your doctor, view your test results, renew your prescriptions, schedule appointments, and more.    How Do I Sign Up?    1. In your internet browser, go to [email or web address]  2. Click on the First Time User? Click Here link in the Sign In box. You will be redirect to the New Member Sign Up page.  3. Enter your MyChart Access Code exactly as it appears below. You will not need to use this code after you???ve completed the sign-up process. If you do not sign up before the expiration date, you must request a new code.    MyChart Access Code: Activation code not generated  Current MyChart Status: Active (This is the date your MyChart access code will expire)    4. Enter the last four digits of your Social Security Number (xxxx) and Date of Birth (mm/dd/yyyy) as indicated and click Submit. You will be taken to the next sign-up page.  5. Create a MyChart ID. This will be your MyChart login ID and cannot be changed, so think of one that is secure and easy to remember.  6. Create a MyChart password. You can change your password at any time.  7. Enter your Password Reset Question and Answer. This can be used at a later time if you forget your password.   8. Enter your e-mail address. You will receive e-mail notification when new information is available in MyChart.  9. Click Sign Up. You can now view and download portions of your medical record.  10. Click the Download Summary menu link to download a portable copy of your medical information.    Additional Information    If you have questions, please visit the Frequently Asked Questions section of the MyChart website at [email or web address] Remember, MyChart is NOT to be used for urgent needs. For medical emergencies, dial 911.    Weeks 34 to 36 of Your  Pregnancy: After Your Visit  Your Care Instructions     By now, your baby and your belly have grown quite large. It is almost time to give birth. A full-term pregnancy can deliver between 37 and 42 weeks. Your baby's lungs are almost ready to breathe air. The bones in your baby's head are now firm enough to protect it, but soft enough to move down through the birth canal.  You may feel excited, happy, anxious, or scared. You may wonder how you will know if you are in labor or what to expect during labor. Try to be flexible in your expectations of the birth. Because each birth is different, there is no way to know exactly what childbirth will be like for you. This care sheet will help you know what to expect and how to prepare. This may make your childbirth easier.  If you haven't already had the Tdap shot during this pregnancy, talk to your doctor about getting it. It will help protect your newborn against pertussis infection.  Follow-up care is a key part of your treatment and safety. Be sure to make and go to all appointments, and call your doctor if you are having problems. It's also a good idea to know your test results and keep a list of the medicines you take.  How can you care for yourself at home?  Learn about pain relief choices  ??   Pain is different for every woman. Talk with your doctor about your feelings about pain.  ?? You can choose from several types of pain relief. These include medicine or breathing techniques, as well as comfort measures. You can use more than one option.  ?? If you choose to have pain medicine during labor, talk to your doctor about your options. Some medicines lower anxiety and help with some of the pain. Others make your lower body numb so that you won't feel pain.  ?? Be sure to tell your doctor about your pain medicine choice before you start labor or very early in your labor. You may be able to change your mind as labor progresses.  ?? Rarely, a woman is put to sleep by medicine  given through a mask or an IV.  Labor and delivery  ?? The first stage of labor has three parts: early, active, and transition.  ?? Most women have early labor at home. You can stay busy or rest, eat light snacks, drink clear fluids, and start counting contractions.  ?? When talking during a contraction gets hard, you may be moving to active labor. During active labor, you should head for the hospital if you are not there already.  ?? You are in active labor when contractions come every 3 to 4 minutes and last about 60 seconds. Your cervix is opening more rapidly.  ?? If your water breaks, contractions will come faster and stronger.  ?? During transition, your cervix is stretching, and contractions are coming more rapidly.  ?? You may want to push, but your cervix might not be ready. Your doctor will tell you when to push.  ?? The second stage starts when your cervix is completely opened and you are ready to push.  ?? Contractions are very strong to push the baby down the birth canal.  ?? You will feel the urge to push. You may feel like you need to have a bowel movement.  ?? You may be coached to push with contractions. These contractions will be very strong, but you will not have them as often. You can get a little rest between contractions.  ?? You may be emotional and irritable. You may not be aware of what is going on around you.  ?? One last push, and your baby is born.  ?? The third stage is when a few more contractions push out the placenta. This may take 30 minutes or less.  ?? The fourth stage is the welcome recovery. You may feel overwhelmed with emotions and exhausted but alert. This is a good time to start breast-feeding.   Where can you learn more?   Go to [email or web address]  Enter B912 in the search box to learn more about "Weeks 34 to 36 of Your Pregnancy: After Your Visit."   ?? 2006-2015 Healthwise, Incorporated. Care instructions adapted under license by [employer] (which disclaims liability or  warranty for this information). This care instruction is for use with your licensed healthcare professional. If you have questions about a medical condition or this instruction, always ask your healthcare professional. Healthwise, Incorporated disclaims any warranty or liability for your use of this information.  Content Version: 10.4.390249; Current as of: January 21, 2013

## 2013-10-26 NOTE — Progress Notes (Signed)
See prenatal flowsheet. Patient is doing well without complaints. Occasional braxton hicks. Only 1-3 per day. Taught to palpate contractions. PTL precautions for 6 or more ctx per hour. F/u in 2 weeks for GBS.

## 2013-11-09 LAB — AMB POC URINALYSIS DIP STICK MANUAL W/O MICRO
Glucose (UA POC): NEGATIVE
Protein (UA POC): NEGATIVE mg/dL

## 2013-11-09 LAB — GYN RAPID GP B STREP: GrBStrep, External: NEGATIVE

## 2013-11-09 NOTE — Progress Notes (Signed)
See PN flow sheet. GBS done today. Discussed birth preferences. Declines circumcision. Open minded regarding birth preferences. Discussed perineal massage and how to perform at home. Doing well and will f/u in one week. Declines SVE. Leopold's vertex.

## 2013-11-09 NOTE — Patient Instructions (Addendum)
MyChart Activation    Thank you for requesting access to MyChart. Please follow the instructions below to securely access and download your online medical record. MyChart allows you to send messages to your doctor, view your test results, renew your prescriptions, schedule appointments, and more.    How Do I Sign Up?    1. In your internet browser, go to [email or web address]  2. Click on the First Time User? Click Here link in the Sign In box. You will be redirect to the New Member Sign Up page.  3. Enter your MyChart Access Code exactly as it appears below. You will not need to use this code after you???ve completed the sign-up process. If you do not sign up before the expiration date, you must request a new code.    MyChart Access Code: Activation code not generated  Current MyChart Status: Active (This is the date your MyChart access code will expire)    4. Enter the last four digits of your Social Security Number (xxxx) and Date of Birth (mm/dd/yyyy) as indicated and click Submit. You will be taken to the next sign-up page.  5. Create a MyChart ID. This will be your MyChart login ID and cannot be changed, so think of one that is secure and easy to remember.  6. Create a MyChart password. You can change your password at any time.  7. Enter your Password Reset Question and Answer. This can be used at a later time if you forget your password.   8. Enter your e-mail address. You will receive e-mail notification when new information is available in [place].  9. Click Sign Up. You can now view and download portions of your medical record.  10. Click the Download Summary menu link to download a portable copy of your medical information.    Additional Information    If you have questions, please visit the Frequently Asked Questions section of the MyChart website at [email or web address] Remember, MyChart is NOT to be used for urgent needs. For medical emergencies, dial 911.    Weeks 34 to 36 of Your  Pregnancy: After Your Visit  Your Care Instructions     By now, your baby and your belly have grown quite large. It is almost time to give birth. A full-term pregnancy can deliver between 37 and 42 weeks. Your baby's lungs are almost ready to breathe air. The bones in your baby's head are now firm enough to protect it, but soft enough to move down through the birth canal.  You may feel excited, happy, anxious, or scared. You may wonder how you will know if you are in labor or what to expect during labor. Try to be flexible in your expectations of the birth. Because each birth is different, there is no way to know exactly what childbirth will be like for you. This care sheet will help you know what to expect and how to prepare. This may make your childbirth easier.  If you haven't already had the Tdap shot during this pregnancy, talk to your doctor about getting it. It will help protect your newborn against pertussis infection.  Follow-up care is a key part of your treatment and safety. Be sure to make and go to all appointments, and call your doctor if you are having problems. It's also a good idea to know your test results and keep a list of the medicines you take.  How can you care for yourself at home?  Learn about pain relief choices  ??  Pain is different for every woman. Talk with your doctor about your feelings about pain.  ?? You can choose from several types of pain relief. These include medicine or breathing techniques, as well as comfort measures. You can use more than one option.  ?? If you choose to have pain medicine during labor, talk to your doctor about your options. Some medicines lower anxiety and help with some of the pain. Others make your lower body numb so that you won't feel pain.  ?? Be sure to tell your doctor about your pain medicine choice before you start labor or very early in your labor. You may be able to change your mind as labor progresses.  ?? Rarely, a woman is put to sleep by medicine  given through a mask or an IV.  Labor and delivery  ?? The first stage of labor has three parts: early, active, and transition.  ?? Most women have early labor at home. You can stay busy or rest, eat light snacks, drink clear fluids, and start counting contractions.  ?? When talking during a contraction gets hard, you may be moving to active labor. During active labor, you should head for the hospital if you are not there already.  ?? You are in active labor when contractions come every 3 to 4 minutes and last about 60 seconds. Your cervix is opening more rapidly.  ?? If your water breaks, contractions will come faster and stronger.  ?? During transition, your cervix is stretching, and contractions are coming more rapidly.  ?? You may want to push, but your cervix might not be ready. Your doctor will tell you when to push.  ?? The second stage starts when your cervix is completely opened and you are ready to push.  ?? Contractions are very strong to push the baby down the birth canal.  ?? You will feel the urge to push. You may feel like you need to have a bowel movement.  ?? You may be coached to push with contractions. These contractions will be very strong, but you will not have them as often. You can get a little rest between contractions.  ?? You may be emotional and irritable. You may not be aware of what is going on around you.  ?? One last push, and your baby is born.  ?? The third stage is when a few more contractions push out the placenta. This may take 30 minutes or less.  ?? The fourth stage is the welcome recovery. You may feel overwhelmed with emotions and exhausted but alert. This is a good time to start breast-feeding.   Where can you learn more?   Go to [email or web address]  Enter B912 in the search box to learn more about "Weeks 34 to 36 of Your Pregnancy: After Your Visit."   ?? 2006-2015 Healthwise, Incorporated. Care instructions adapted under license by [employer] (which disclaims liability or  warranty for this information). This care instruction is for use with your licensed healthcare professional. If you have questions about a medical condition or this instruction, always ask your healthcare professional. [place] any warranty or liability for your use of this information.  Content Version: 10.4.390249; Current as of: January 21, 2013

## 2013-11-16 LAB — AMB POC URINALYSIS DIP STICK MANUAL W/O MICRO
Glucose (UA POC): NEGATIVE
Protein (UA POC): NEGATIVE mg/dL

## 2013-11-16 NOTE — Patient Instructions (Addendum)
MyChart Activation    Thank you for requesting access to MyChart. Please follow the instructions below to securely access and download your online medical record. MyChart allows you to send messages to your doctor, view your test results, renew your prescriptions, schedule appointments, and more.    How Do I Sign Up?    1. In your internet browser, go to [email or web address]  2. Click on the First Time User? Click Here link in the Sign In box. You will be redirect to the New Member Sign Up page.  3. Enter your MyChart Access Code exactly as it appears below. You will not need to use this code after you???ve completed the sign-up process. If you do not sign up before the expiration date, you must request a new code.    MyChart Access Code: Activation code not generated  Current MyChart Status: Active (This is the date your MyChart access code will expire)    4. Enter the last four digits of your Social Security Number (xxxx) and Date of Birth (mm/dd/yyyy) as indicated and click Submit. You will be taken to the next sign-up page.  5. Create a MyChart ID. This will be your MyChart login ID and cannot be changed, so think of one that is secure and easy to remember.  6. Create a MyChart password. You can change your password at any time.  7. Enter your Password Reset Question and Answer. This can be used at a later time if you forget your password.   8. Enter your e-mail address. You will receive e-mail notification when new information is available in [place].  9. Click Sign Up. You can now view and download portions of your medical record.  10. Click the Download Summary menu link to download a portable copy of your medical information.    Additional Information    If you have questions, please visit the Frequently Asked Questions section of the MyChart website at [email or web address] Remember, MyChart is NOT to be used for urgent needs. For medical emergencies, dial 911.        Week 68 of Your  Pregnancy: After Your Visit  Your Care Instructions     You are near the end of your pregnancy???and you're probably pretty uncomfortable. It may be harder to walk around. Lying down probably isn't comfortable either. You may have trouble getting to sleep or staying asleep.  Most women deliver their babies between 56 and 19 weeks. This is a good time to think about packing a bag for the hospital with items you'll need. Then you'll be ready when labor starts.  Follow-up care is a key part of your treatment and safety. Be sure to make and go to all appointments, and call your doctor if you are having problems. It's also a good idea to know your test results and keep a list of the medicines you take.  How can you care for yourself at home?  Learn about breast-feeding  ?? Breast-feeding is best for your baby and good for you.  ?? Breast milk has antibodies to help your baby fight infections.  ?? Mothers who breast-feed often lose weight faster, because making milk burns calories.  ?? Learning the best ways to hold your baby will make breast-feeding easier.  ?? Let your partner bathe and diaper the baby to keep your partner from feeling left out. Snuggle together when you breast-feed.  ?? You may want to learn how to use a breast pump and store your milk.  ??  If you choose to bottle feed, make the feeding feel like breast-feeding so you can bond with your baby. Always hold your baby and the bottle. Do not prop bottles or let your baby fall asleep with a bottle.  Learn about crying  ?? It is common for babies to cry for 1 to 3 hours a day. Some cry more, some cry less.  ?? Babies don't cry to make you upset or because you are a bad parent.  ?? Crying is how your baby communicates. Your baby may be hungry; have gas; need a diaper change; or feel cold, warm, tired, lonely, or tense. Sometimes babies cry for unknown reasons.  ?? If you respond to your baby's needs, he or she will learn to trust you.  ?? Try to stay calm when your baby  cries. Your baby may get more upset if he or she senses that you are upset.  Know how to care for your newborn  ?? Your baby's umbilical cord stump will drop off on its own, usually between 1 and 2 weeks. To care for your baby's umbilical cord area:  ?? Clean the area at the bottom of the cord 2 or 3 times a day.  ?? Pay special attention to the area where the cord attaches to the skin.  ?? Keep the diaper folded below the cord.  ?? Use a damp washcloth or cotton ball to sponge bathe your baby until the stump has come off.  ?? Your baby's first dark stool is called meconium. After the meconium is passed, your baby will develop his or her own bowel pattern.  ?? Some babies, especially breast-fed babies, have several bowel movements a day. Others have one or two a day, or one every 2 to 3 days.  ?? Breast-fed babies often have loose, yellow stools. Formula-fed babies have more formed stools.  ?? If your baby's stools look like little pellets, he or she is constipated. After 2 days of constipation, call your baby's doctor.  ?? If your baby was circumcised:  ?? Gently rinse his penis with warm water after every diaper change. Do not try to remove the film that forms on the penis. This film will go away on its own. Pat dry.  ?? Put petroleum ointment, such as Vaseline, on the area of the diaper that will touch your baby's penis. This will keep the diaper from sticking to your baby.  ?? Ask the doctor about giving your baby acetaminophen (Tylenol) for pain.   Where can you learn more?   Go to [email or web address]  Enter N257 in the search box to learn more about "Week 37 of Your Pregnancy: After Your Visit."   ?? 2006-2015 Healthwise, Incorporated. Care instructions adapted under license by [employer] (which disclaims liability or warranty for this information). This care instruction is for use with your licensed healthcare professional. If you have questions about a medical condition or this instruction, always ask  your healthcare professional. [place] any warranty or liability for your use of this information.  Content Version: 10.4.390249; Current as of: December 10, 2012

## 2013-11-16 NOTE — Progress Notes (Unsigned)
See prenatal flowsheet. Patient is doing well without complaints.

## 2013-11-23 LAB — AMB POC URINALYSIS DIP STICK MANUAL W/O MICRO
Glucose (UA POC): NEGATIVE
Protein (UA POC): NEGATIVE mg/dL

## 2013-11-23 NOTE — Patient Instructions (Addendum)
MyChart Activation    Thank you for requesting access to MyChart. Please follow the instructions below to securely access and download your online medical record. MyChart allows you to send messages to your doctor, view your test results, renew your prescriptions, schedule appointments, and more.    How Do I Sign Up?    1. In your internet browser, go to [email or web address]  2. Click on the First Time User? Click Here link in the Sign In box. You will be redirect to the New Member Sign Up page.  3. Enter your MyChart Access Code exactly as it appears below. You will not need to use this code after you???ve completed the sign-up process. If you do not sign up before the expiration date, you must request a new code.    MyChart Access Code: Activation code not generated  Current MyChart Status: Active (This is the date your MyChart access code will expire)    4. Enter the last four digits of your Social Security Number (xxxx) and Date of Birth (mm/dd/yyyy) as indicated and click Submit. You will be taken to the next sign-up page.  5. Create a MyChart ID. This will be your MyChart login ID and cannot be changed, so think of one that is secure and easy to remember.  6. Create a MyChart password. You can change your password at any time.  7. Enter your Password Reset Question and Answer. This can be used at a later time if you forget your password.   8. Enter your e-mail address. You will receive e-mail notification when new information is available in MyChart.  9. Click Sign Up. You can now view and download portions of your medical record.  10. Click the Download Summary menu link to download a portable copy of your medical information.    Additional Information    If you have questions, please visit the Frequently Asked Questions section of the MyChart website at [email or web address] Remember, MyChart is NOT to be used for urgent needs. For medical emergencies, dial 911.      Week 38 of Your  Pregnancy: After Your Visit  Your Care Instructions     Believe it or not, your baby is almost here. You may have ideas about your baby's personality because of how much he or she moves. Or you may have noticed how he or she responds to sounds, warmth, cold, and light. You may even know what kind of music your baby likes.  By now, you have a better idea of what to expect during delivery. You may have talked about your birth preferences with your doctor. But even if you want a vaginal birth, it is a good idea to learn about cesarean births. Cesarean birth means that your baby is born through a cut (incision) in your lower belly. It is sometimes the best choice for the health of the baby and the mother.  This care sheet can help you understand cesarean births. It also gives you information about what to expect after your baby is born. And it helps you understand more about postpartum depression.  Follow-up care is a key part of your treatment and safety. Be sure to make and go to all appointments, and call your doctor if you are having problems. It's also a good idea to know your test results and keep a list of the medicines you take.  How can you care for yourself at home?  Learn about cesarean birth  ?? Cesarean birth is the best   choice when:  ?? The mother's pelvis is too small or the baby's head is too big to go through the pelvis.  ?? The baby's head is not facing down. This position is called breech.  ?? The baby is not getting enough oxygen through the umbilical cord.  ?? The placenta is blocking the cervix.  ?? The mother has a herpes outbreak. The baby could get herpes if it comes through the birth canal.  ?? The cervix is not opening (dilating), even though you are in active labor.  Know what to expect after delivery, and plan for the first few weeks at home  ?? You, your baby, and your partner or coach will get identification bands. Only people with matching bands can pick up the baby from the nursery.  ?? You will  learn how to feed, diaper, and bathe your baby. And you will learn how to care for the umbilical cord stump. If your baby is circumcised, you will also learn how to care for that.  ?? Ask people to wait to visit you until you are at home. And ask them to wash their hands before they touch your baby.  ?? Make sure you have another adult in your home for at least 2 or 3 days after the birth.  ?? During the first 2 weeks, limit when friends and family can visit.  ?? Do not allow visitors who have colds or infections. Never let anyone smoke around your baby.  ?? Try to nap when the baby naps.  Be aware of postpartum depression  ?? "Baby blues" are common for the first 1 to 2 weeks after birth. You may cry or feel sad or irritable for no reason.  ?? For some women, these feelings last longer and are more intense. This is called postpartum depression.  ?? If your symptoms last for more than a few weeks or you feel very depressed, ask your doctor for help.  ?? Postpartum depression can be treated. Support groups and counseling can help. Sometimes medicine can also help.   Where can you learn more?   Go to http://www.healthwise.net/[facility]  Enter B044 in the search box to learn more about "Week 38 of Your Pregnancy: After Your Visit."   ?? 2006-2015 Healthwise, Incorporated. Care instructions adapted under license by [place] (which disclaims liability or warranty for this information). This care instruction is for use with your licensed healthcare professional. If you have questions about a medical condition or this instruction, always ask your healthcare professional. Healthwise, Incorporated disclaims any warranty or liability for your use of this information.  Content Version: 10.4.390249; Current as of: May 22, 2013

## 2013-11-23 NOTE — Progress Notes (Signed)
See prenatal flowsheet. Patient is doing well without complaints. Declines SVE. Only occasional braxton hicks. Routine f/u in 1 week.

## 2013-12-01 LAB — AMB POC URINALYSIS DIP STICK MANUAL W/O MICRO
Glucose (UA POC): NEGATIVE
Protein (UA POC): NEGATIVE mg/dL

## 2013-12-01 NOTE — Progress Notes (Signed)
See prenatal flowsheet. Patient is doing well without complaints. Reports occasional [place] however no regular contractions. +FM. Declines SVE. Discussed that we would do a BPP after 40 weeks. She agrees and will return in 1 week if undelivered.

## 2013-12-01 NOTE — Patient Instructions (Addendum)
MyChart Activation    Thank you for requesting access to MyChart. Please follow the instructions below to securely access and download your online medical record. MyChart allows you to send messages to your doctor, view your test results, renew your prescriptions, schedule appointments, and more.    How Do I Sign Up?    1. In your internet browser, go to [email or web address]  2. Click on the First Time User? Click Here link in the Sign In box. You will be redirect to the New Member Sign Up page.  3. Enter your MyChart Access Code exactly as it appears below. You will not need to use this code after you???ve completed the sign-up process. If you do not sign up before the expiration date, you must request a new code.    MyChart Access Code: Activation code not generated  Current MyChart Status: Active (This is the date your MyChart access code will expire)    4. Enter the last four digits of your Social Security Number (xxxx) and Date of Birth (mm/dd/yyyy) as indicated and click Submit. You will be taken to the next sign-up page.  5. Create a MyChart ID. This will be your MyChart login ID and cannot be changed, so think of one that is secure and easy to remember.  6. Create a MyChart password. You can change your password at any time.  7. Enter your Password Reset Question and Answer. This can be used at a later time if you forget your password.   8. Enter your e-mail address. You will receive e-mail notification when new information is available in [place].  9. Click Sign Up. You can now view and download portions of your medical record.  10. Click the Download Summary menu link to download a portable copy of your medical information.    Additional Information    If you have questions, please visit the Frequently Asked Questions section of the MyChart website at [email or web address] Remember, MyChart is NOT to be used for urgent needs. For medical emergencies, dial 911.      Week 27 of Your  Pregnancy: After Your Visit  Your Care Instructions     During these final weeks, you may feel anxious to see your new baby. Newborn babies often look different from what you see in pictures or movies. Right after birth, their heads may have a strange shape. Their eyes may be puffy. And their genitals may be swollen. They may also have very dry skin, or red marks on the eyelids, nose, or neck. Still, most parents think their babies are beautiful.  Follow-up care is a key part of your treatment and safety. Be sure to make and go to all appointments, and call your doctor if you are having problems. It's also a good idea to know your test results and keep a list of the medicines you take.  How can you care for yourself at home?  Prepare to breast-feed  ?? If you are breast-feeding, continue to eat healthy foods.  ?? Avoid alcohol, cigarettes, and drugs. This includes prescription and over-the-counter medicines.  ?? You can help prevent sore nipples if you feed your baby in the correct position. Nurses will help you learn to do this.  ?? Your newborn will need to be fed about every 1?? to 3 hours.  Choose the right birth control after your baby is born  ?? Women who are breast-feeding can still get pregnant. Use birth control if you don't want to get pregnant.  ??  Intrauterine devices (IUDs) work for women who have only one sex partner and who want to wait at least 2 years before getting pregnant again. They are safe to use while you are breast-feeding.  ?? Depo-Provera can be used while you are breast-feeding. It is a shot you get every 3 months.  ?? Birth control pills work well. But you need a different kind of pill while you are breast-feeding. And when you start taking these pills, you need to make sure to use another type of birth control until you start your second pack.  ?? Diaphragms, cervical caps, tubal implants, and condoms with spermicide work less well after birth. If you have a diaphragm or cervical cap, you will  need to have it refitted.  ?? Tubal ligation (tying your tubes) and vasectomy are both permanent. These are good options if you are sure you are done having children.   Where can you learn more?   Go to [employer].[facility]  Enter A811 in the search box to learn more about "Week 39 of Your Pregnancy: After Your Visit."   ?? 2006-2015 Healthwise, Incorporated. Care instructions adapted under license by [employer] (which disclaims liability or warranty for this information). This care instruction is for use with your licensed healthcare professional. If you have questions about a medical condition or this instruction, always ask your healthcare professional. [place] any warranty or liability for your use of this information.  Content Version: 10.4.390249; Current as of: May 22, 2013

## 2013-12-07 LAB — AMB POC URINALYSIS DIP STICK MANUAL W/O MICRO
Glucose (UA POC): NEGATIVE
Protein (UA POC): NEGATIVE mg/dL

## 2013-12-07 NOTE — Progress Notes (Signed)
See prenatal flowsheet. Patient is doing well without complaints. BPP done today for post-dates pregnancy. SVE today closed-FT/50/high. Unable to strip membranes. GBS negative. Reviewed importance of fetal movement post-dates. Nyala would like to continue and wait for spontaneous labor. Informed that if she continues past 41 weeks, we would need to schedule an NST or BPP.  Advised that she may have spotting after today's visit due to Archie. Labor precautions reviewed.

## 2013-12-07 NOTE — Patient Instructions (Addendum)
MyChart Activation    Thank you for requesting access to MyChart. Please follow the instructions below to securely access and download your online medical record. MyChart allows you to send messages to your doctor, view your test results, renew your prescriptions, schedule appointments, and more.    How Do I Sign Up?    1. In your internet browser, go to [email or web address]  2. Click on the First Time User? Click Here link in the Sign In box. You will be redirect to the New Member Sign Up page.  3. Enter your MyChart Access Code exactly as it appears below. You will not need to use this code after you???ve completed the sign-up process. If you do not sign up before the expiration date, you must request a new code.    MyChart Access Code: Activation code not generated  Current MyChart Status: Active (This is the date your MyChart access code will expire)    4. Enter the last four digits of your Social Security Number (xxxx) and Date of Birth (mm/dd/yyyy) as indicated and click Submit. You will be taken to the next sign-up page.  5. Create a MyChart ID. This will be your MyChart login ID and cannot be changed, so think of one that is secure and easy to remember.  6. Create a MyChart password. You can change your password at any time.  7. Enter your Password Reset Question and Answer. This can be used at a later time if you forget your password.   8. Enter your e-mail address. You will receive e-mail notification when new information is available in [place].  9. Click Sign Up. You can now view and download portions of your medical record.  10. Click the Download Summary menu link to download a portable copy of your medical information.    Additional Information    If you have questions, please visit the Frequently Asked Questions section of the MyChart website at [email or web address] Remember, MyChart is NOT to be used for urgent needs. For medical emergencies, dial 911.    Week 40 of Your  Pregnancy: After Your Visit  Your Care Instructions     By week 40, you have reached your due date. Your baby could be coming any day. But it's a good idea to think ahead to the next few weeks and what might happen.  If this is your first time having a baby, try not to worry. If you don't start labor on your own by 41 or 42 weeks, your doctor may recommend giving you medicines to start labor.  This care sheet gives you information about how labor can be started. It also gives you some ideas about breathing exercises you can do if you start to feel anxious or if you are trying to relax.  Follow-up care is a key part of your treatment and safety. Be sure to make and go to all appointments, and call your doctor if you are having problems. It's also a good idea to know your test results and keep a list of the medicines you take.  How can you care for yourself at home?  Learn how labor can be started  ?? If you and your baby are both healthy and ready, and if your cervix has started to open, your doctor may "break your water" (rupture the amniotic sac). This often starts labor.  ?? If your cervix is not quite ready, you may get a medicine called Pitocin through an IV to start contractions.  ??  If your cervix is still very firm, you may have prostaglandin tablets (misoprostol) placed in your vagina to soften the cervix.  Try guided imagery to help you relax   ?? Find a comfortable place to sit or lie down. Close your eyes.  ?? Start by just taking a few deep breaths to help you relax.  ?? Picture a setting that is calm and peaceful. This could be a beach, a mountain setting, a meadow, or a scene that you choose.  ?? Imagine your scene, and try to add some detail. For example, is there a breeze? What does the sky look like? Is it clear, or are there clouds?  ?? It often helps to add a path to your scene. For example, as you enter the meadow, imagine a path leading you through the meadow to the trees on the other side. As you  follow the path farther into the [facility] you feel more and more relaxed.  ?? When you are deep into your scene and are feeling relaxed, take a few minutes to breathe slowly and feel the calm.  ?? When you are ready, slowly take yourself out of the scene back to the present. Tell yourself that you will feel relaxed and refreshed and will bring that sense of calm with you.  ?? Count to 3, and open your eyes.   Where can you learn more?   Go to [employer].[facility]  Enter [phone] in the search box to learn more about "Week 40 of Your Pregnancy: After Your Visit."   ?? 2006-2015 Healthwise, Incorporated. Care instructions adapted under license by [employer] (which disclaims liability or warranty for this information). This care instruction is for use with your licensed healthcare professional. If you have questions about a medical condition or this instruction, always ask your healthcare professional. [place] any warranty or liability for your use of this information.  Content Version: 10.4.390249; Current as of: March 17, 2013

## 2013-12-08 NOTE — Telephone Encounter (Signed)
From: Brittany Turner  To: [name], NP  Sent: 12/08/2013 1:14 PM EDT  Subject: Visit Follow-Up Question    Hey - If I'm still pregnant next week (please just no.), should I be scheduled for another BPP? or a NST? I just noticed I'm not scheduled with ultrasound, just the doctor.

## 2013-12-11 NOTE — Patient Instructions (Addendum)
MyChart Activation    Thank you for requesting access to MyChart. Please follow the instructions below to securely access and download your online medical record. MyChart allows you to send messages to your doctor, view your test results, renew your prescriptions, schedule appointments, and more.    How Do I Sign Up?    1. In your internet browser, go to [email or web address]  2. Click on the First Time User? Click Here link in the Sign In box. You will be redirect to the New Member Sign Up page.  3. Enter your MyChart Access Code exactly as it appears below. You will not need to use this code after you???ve completed the sign-up process. If you do not sign up before the expiration date, you must request a new code.    MyChart Access Code: Activation code not generated  Current MyChart Status: Active (This is the date your MyChart access code will expire)    4. Enter the last four digits of your Social Security Number (xxxx) and Date of Birth (mm/dd/yyyy) as indicated and click Submit. You will be taken to the next sign-up page.  5. Create a MyChart ID. This will be your MyChart login ID and cannot be changed, so think of one that is secure and easy to remember.  6. Create a MyChart password. You can change your password at any time.  7. Enter your Password Reset Question and Answer. This can be used at a later time if you forget your password.   8. Enter your e-mail address. You will receive e-mail notification when new information is available in [place].  9. Click Sign Up. You can now view and download portions of your medical record.  10. Click the Download Summary menu link to download a portable copy of your medical information.    Additional Information    If you have questions, please visit the Frequently Asked Questions section of the MyChart website at [email or web address] Remember, MyChart is NOT to be used for urgent needs. For medical emergencies, dial 911.      Week 40 of Your  Pregnancy: After Your Visit  Your Care Instructions     By week 40, you have reached your due date. Your baby could be coming any day. But it's a good idea to think ahead to the next few weeks and what might happen.  If this is your first time having a baby, try not to worry. If you don't start labor on your own by 41 or 42 weeks, your doctor may recommend giving you medicines to start labor.  This care sheet gives you information about how labor can be started. It also gives you some ideas about breathing exercises you can do if you start to feel anxious or if you are trying to relax.  Follow-up care is a key part of your treatment and safety. Be sure to make and go to all appointments, and call your doctor if you are having problems. It's also a good idea to know your test results and keep a list of the medicines you take.  How can you care for yourself at home?  Learn how labor can be started  ?? If you and your baby are both healthy and ready, and if your cervix has started to open, your doctor may "break your water" (rupture the amniotic sac). This often starts labor.  ?? If your cervix is not quite ready, you may get a medicine called Pitocin through an IV to start contractions.  ??  If your cervix is still very firm, you may have prostaglandin tablets (misoprostol) placed in your vagina to soften the cervix.  Try guided imagery to help you relax   ?? Find a comfortable place to sit or lie down. Close your eyes.  ?? Start by just taking a few deep breaths to help you relax.  ?? Picture a setting that is calm and peaceful. This could be a beach, a mountain setting, a meadow, or a scene that you choose.  ?? Imagine your scene, and try to add some detail. For example, is there a breeze? What does the sky look like? Is it clear, or are there clouds?  ?? It often helps to add a path to your scene. For example, as you enter the meadow, imagine a path leading you through the meadow to the trees on the other side. As you  follow the path farther into the [facility] you feel more and more relaxed.  ?? When you are deep into your scene and are feeling relaxed, take a few minutes to breathe slowly and feel the calm.  ?? When you are ready, slowly take yourself out of the scene back to the present. Tell yourself that you will feel relaxed and refreshed and will bring that sense of calm with you.  ?? Count to 3, and open your eyes.   Where can you learn more?   Go to [employer].[facility]  Enter [phone] in the search box to learn more about "Week 40 of Your Pregnancy: After Your Visit."   ?? 2006-2015 Healthwise, Incorporated. Care instructions adapted under license by [employer] (which disclaims liability or warranty for this information). This care instruction is for use with your licensed healthcare professional. If you have questions about a medical condition or this instruction, always ask your healthcare professional. [place] any warranty or liability for your use of this information.  Content Version: 10.4.390249; Current as of: March 17, 2013

## 2013-12-11 NOTE — Progress Notes (Signed)
See PN Flowsheet. GBS negative. Pt desires membrane stripping. SVE 1cm

## 2013-12-13 ENCOUNTER — Inpatient Hospital Stay
Admit: 2013-12-13 | Discharge: 2013-12-16 | Disposition: A | Discharge status: Home / Self Care | Payer: BLUE CROSS/BLUE SHIELD | Source: Ambulatory Visit | Attending: Obstetrics & Gynecology | Admitting: Obstetrics & Gynecology

## 2013-12-13 LAB — CBC W/O DIFF
HCT: 41 % (ref 35.8–46.3)
HGB: 14.1 g/dL (ref 11.7–15.4)
MCH: 30.1 PG (ref 26.1–32.9)
MCHC: 34.4 g/dL (ref 31.4–35.0)
MCV: 87.4 FL (ref 79.6–97.8)
MPV: 11.7 FL (ref 10.8–14.1)
PLATELET: 149 10*3/uL — ABNORMAL LOW (ref 150–450)
RBC: 4.69 M/uL (ref 4.05–5.25)
RDW: 13.5 % (ref 11.9–14.6)
WBC: 15.6 10*3/uL — ABNORMAL HIGH (ref 4.3–11.1)

## 2013-12-13 MED ORDER — LACTATED RINGERS BOLUS IV
INTRAVENOUS | Status: AC | PRN
Start: 2013-12-13 — End: 2013-12-13
  Administered 2013-12-13: 18:00:00 via INTRAVENOUS

## 2013-12-13 MED ORDER — SODIUM CHLORIDE 0.9 % IJ SYRG
Freq: Three times a day (TID) | INTRAMUSCULAR | Status: DC
Start: 2013-12-13 — End: 2013-12-15

## 2013-12-13 MED ORDER — LACTATED RINGERS BOLUS IV
Freq: Once | INTRAVENOUS | Status: AC
Start: 2013-12-13 — End: 2013-12-14

## 2013-12-13 MED ORDER — SODIUM CHLORIDE 0.9 % IJ SYRG
Freq: Three times a day (TID) | INTRAMUSCULAR | Status: DC
Start: 2013-12-13 — End: 2013-12-14

## 2013-12-13 MED ORDER — SODIUM CHLORIDE 0.9 % IJ SYRG
INTRAMUSCULAR | Status: DC | PRN
Start: 2013-12-13 — End: 2013-12-14

## 2013-12-13 MED ORDER — SODIUM CHLORIDE 0.9 % IJ SYRG
INTRAMUSCULAR | Status: DC | PRN
Start: 2013-12-13 — End: 2013-12-15

## 2013-12-13 MED ADMIN — dextrose 5% lactated ringers infusion: INTRAVENOUS | @ 17:00:00 | NDC 00409792909

## 2013-12-13 MED ADMIN — ondansetron (ZOFRAN) injection 4 mg: INTRAVENOUS | NDC 67457044000

## 2013-12-13 MED ADMIN — oxytocin (PITOCIN) 30 units/500 ml LR: INTRAVENOUS | NDC 99991083550

## 2013-12-13 MED ADMIN — promethazine (PHENERGAN) injection 25 mg: INTRAMUSCULAR | @ 12:00:00 | NDC 00641092821

## 2013-12-13 MED ADMIN — promethazine (PHENERGAN) injection 12.5 mg: INTRAVENOUS | @ 16:00:00 | NDC 00641092821

## 2013-12-13 MED ADMIN — dextrose 5% lactated ringers infusion: INTRAVENOUS | NDC 00338012504

## 2013-12-13 MED ADMIN — lactated ringers infusion: INTRAVENOUS | @ 23:00:00 | NDC 00409795309

## 2013-12-13 MED ADMIN — butorphanol (STADOL) injection 1 mg: INTRAMUSCULAR | @ 12:00:00 | NDC 00409162301

## 2013-12-13 MED ADMIN — oxytocin (PITOCIN) 30 units/500 ml LR: INTRAVENOUS | @ 22:00:00 | NDC 99991083550

## 2013-12-13 MED ADMIN — lactated ringers infusion: INTRAVENOUS | @ 19:00:00 | NDC 00409795309

## 2013-12-13 MED ADMIN — butorphanol (STADOL) injection 1 mg: INTRAVENOUS | @ 16:00:00 | NDC 00409162301

## 2013-12-13 MED ADMIN — oxytocin (PITOCIN) 30 units/500 ml LR: INTRAVENOUS | @ 23:00:00 | NDC 99991083550

## 2013-12-13 MED ADMIN — ropivacaine (NAROPIN) injection: EPIDURAL | @ 19:00:00 | NDC 70069006225

## 2013-12-13 MED FILL — BUTORPHANOL TARTRATE 1 MG/ML INJECTION: 1 mg/mL | INTRAMUSCULAR | Qty: 1

## 2013-12-13 MED FILL — PROMETHAZINE 25 MG/ML INJECTION: 25 mg/mL | INTRAMUSCULAR | Qty: 1

## 2013-12-13 MED FILL — OXYTOCIN 30 UNIT IN 500 ML INFUSION: 30 unit/500 mL | INTRAVENOUS | Qty: 500

## 2013-12-13 MED FILL — ONDANSETRON (PF) 4 MG/2 ML INJECTION: 4 mg/2 mL | INTRAMUSCULAR | Qty: 2

## 2013-12-13 NOTE — Progress Notes (Signed)
RN at bedside.  Pt resting comfortably with eyes closed.  Laying on left side.  Brittany Turner started at this time.  See MAR.  Pt's husband remains at bedside.

## 2013-12-13 NOTE — Anesthesia Procedure Notes (Addendum)
Epidural Block    Start time: 12/13/2013 3:00 PM  End time: 12/13/2013 3:08 PM  Reason for block: labor epidural  Staffing  Anesthesiologist: Gregor Hams D  Performed by: anesthesiologist   Prep  Risks and benefits discussed with the patient and plans are to proceed   Site marked, Timeout performed, 15:00  Patient was placed in seated position  The back was prepped at the lumbar region  Prep Solution(s): chlorhexidine  Epidural  Epidural Location: L3-4  Needle: 17G Tuohy  Attempts: needle passed 1 time(s) with loss of resistance using air  Catheter: 19 G epidural catheter placed/secured  No blood with aspiration, no cerebrospinal fluid with aspiration, no paresthesia and negative aspiration test  Test dose: lidocaine 1.5% w/ epi  Assessment  Catheter was secured to back with tegaderm and tape  Insertion was uncomplicated and patient tolerated without any apparent complications  Additional Notes  Anesthesiology Epidural Procedure Note    Risks and benefits were discussed with patient and plans are to proceed. Patient was placed in the sitting position. The back was prepped at the lumbar region with Chlorhexidine. 1% xylocaine was used as local at L3-L4. A number 17 tuohy needle was passed 1 times with loss of resistance using air. A 19 g flexible wire incorporated catheter was passed   of to a distance of 3 cm in epidural space. Aspiration was negative. Test dose was negative. Catheter was secured with tegaderm and tape. Insertion was uncomplicated. Additional commments:      Epidural Block    Start time: 12/14/2013 4:50 AM  End time: 12/14/2013 5:00 AM  Reason for block: labor epidural  Staffing  Anesthesiologist: Gregor Hams D  Performed by: anesthesiologist   Prep  Risks and benefits discussed with the patient and plans are to proceed   Site marked, Timeout performed, 04:50  Patient was placed in seated position  The back was prepped at the lumbar region  Prep Solution(s): chlorhexidine  Epidural  Epidural Location:  L4-5  Needle: 17G Tuohy  Attempts: needle passed 1 time(s) with loss of resistance using air  Catheter: 19 G epidural catheter placed/secured  No blood with aspiration, no cerebrospinal fluid with aspiration, no paresthesia and negative aspiration test  Test dose: lidocaine 1.5% w/ epi  Assessment  Catheter was secured to back with tegaderm and tape  Insertion was uncomplicated and patient tolerated without any apparent complications  Additional Notes  Anesthesiology Epidural Procedure Note    Risks and benefits were discussed with patient and plans are to proceed. Patient was placed in the sitting position. The back was prepped at the lumbar region with Chlorhexidine. 1% xylocaine was used as local at L4-L5. A number 17 tuohy needle was passed 1 times with loss of resistance using air. A 19 g flexible wire incorporated catheter was passed   of to a distance of 3 cm in epidural space. Aspiration was negative. Test dose was negative. Catheter was secured with tegaderm and tape. Insertion was uncomplicated. Additional commments:

## 2013-12-13 NOTE — Progress Notes (Signed)
Spoke with Dr. Geni Bers.  Reported the assessment.  Orders received for discharge.  Give labor precautions.  Patient has an appointment on Tuesday if hasn't delivered by then.

## 2013-12-13 NOTE — Progress Notes (Signed)
Report received from [facility], care assumed.  Admission Note    Pt admitted to [place].Admission assessment completed.     Discussed plan of care with patient.      IV started, Consents witnessed. Lab work drawn, sent to lab.     Pt informed of need to track I&O for entire stay, instructed on use of specipan.    Reviewed "pain goal" with patient, explaining realistic expectations for pain relief.        Remote Monitor applied per [facility].    Pt coping well with UC,

## 2013-12-13 NOTE — Progress Notes (Signed)
Report given to Talmage Coin, RN. Pt care relinquished.

## 2013-12-13 NOTE — Progress Notes (Signed)
Foley catheter inserted by mandy funderburk rn.  Urine draining at this time.

## 2013-12-13 NOTE — Progress Notes (Signed)
Pt sitting up for epidural, Dr Sherbert here.  Time out done.

## 2013-12-13 NOTE — Progress Notes (Signed)
Dr Geni Bers called and informed of pt status,  Orders received for pitocin augmentation.

## 2013-12-13 NOTE — Progress Notes (Signed)
Dr Gerri Spore called and informed of pt status.  She will be in to see her.    1427  Dr Geni Bers called for epidural request , message left.

## 2013-12-13 NOTE — Progress Notes (Signed)
12/13/13 1240   Cervical Exam   Dilation (cm) 1   Eff 100 %   Membrane Status Bulging   SVE done. Pt reports relief from analgesic 5/10

## 2013-12-13 NOTE — Progress Notes (Signed)
Discharge instructions reviewed. All questions answered.  Pt verbalizes understanding.

## 2013-12-13 NOTE — Progress Notes (Signed)
Triage flow sheet completed.  Pain rates 7;/10 but patient states she does not want any pain intervention.

## 2013-12-13 NOTE — Anesthesia Procedure Notes (Signed)
Epidural Block    Start time: 12/13/2013 3:00 PM  End time: 12/13/2013 3:08 PM  Reason for block: labor epidural  Staffing  Anesthesiologist: Gregor Hams D  Performed by: anesthesiologist   Prep  Risks and benefits discussed with the patient and plans are to proceed   Site marked, Timeout performed, 15:00  Patient was placed in seated position  The back was prepped at the lumbar region  Prep Solution(s): chlorhexidine  Epidural  Epidural Location: L3-4  Needle: 17G Tuohy  Attempts: needle passed 1 time(s) with loss of resistance using air  Catheter: 19 G epidural catheter placed/secured  No blood with aspiration, no cerebrospinal fluid with aspiration, no paresthesia and negative aspiration test  Test dose: lidocaine 1.5% w/ epi  Assessment  Catheter was secured to back with tegaderm and tape  Insertion was uncomplicated and patient tolerated without any apparent complications  Additional Notes  Anesthesiology Epidural Procedure Note    Risks and benefits were discussed with patient and plans are to proceed. Patient was placed in the sitting position. The back was prepped at the lumbar region with Chlorhexidine. 1% xylocaine was used as local at L3-L4. A number 17 tuohy needle was passed 1 times with loss of resistance using air. A 19 g flexible wire incorporated catheter was passed   of to a distance of 3 cm in epidural space. Aspiration was negative. Test dose was negative. Catheter was secured with tegaderm and tape. Insertion was uncomplicated. Additional commments:      Epidural Block    Start time: 12/14/2013 4:50 AM  End time: 12/14/2013 5:00 AM  Reason for block: labor epidural  Staffing  Anesthesiologist: Gregor Hams D  Performed by: anesthesiologist   Prep  Risks and benefits discussed with the patient and plans are to proceed   Site marked, Timeout performed, 04:50  Patient was placed in seated position  The back was prepped at the lumbar region  Prep Solution(s): chlorhexidine  Epidural  Epidural Location:  L4-5  Needle: 17G Tuohy  Attempts: needle passed 1 time(s) with loss of resistance using air  Catheter: 19 G epidural catheter placed/secured  No blood with aspiration, no cerebrospinal fluid with aspiration, no paresthesia and negative aspiration test  Test dose: lidocaine 1.5% w/ epi  Assessment  Catheter was secured to back with tegaderm and tape  Insertion was uncomplicated and patient tolerated without any apparent complications  Additional Notes  Anesthesiology Epidural Procedure Note    Risks and benefits were discussed with patient and plans are to proceed. Patient was placed in the sitting position. The back was prepped at the lumbar region with Chlorhexidine. 1% xylocaine was used as local at L4-L5. A number 17 tuohy needle was passed 1 times with loss of resistance using air. A 19 g flexible wire incorporated catheter was passed   of to a distance of 3 cm in epidural space. Aspiration was negative. Test dose was negative. Catheter was secured with tegaderm and tape. Insertion was uncomplicated. Additional commments:

## 2013-12-13 NOTE — Progress Notes (Signed)
Report to [employer], care relinquished.

## 2013-12-13 NOTE — Progress Notes (Signed)
After discussing pt's allergies with pharmacy, orders received from dr beaudoin for clindamycin and gentamycin.

## 2013-12-13 NOTE — Progress Notes (Signed)
Bedside sbar report received from barb buikema rn.  Pt care assumed.   Pt positioned onto right hip lateral side.  Pitocin infusing at 4 milliunits.  Bag #2 of LR hung and not infusing. Ice chips provided.  Gown changed.     Pt denies pain/needs at this time.  Pt's husband at bedside.

## 2013-12-13 NOTE — Progress Notes (Signed)
Admit to room 430 for labor.

## 2013-12-13 NOTE — Anesthesia Pre-Procedure Evaluation (Signed)
Anesthetic History              Review of Systems / Medical History  Patient summary reviewed, nursing notes reviewed and pertinent labs reviewed    Pulmonary                 Neuro/Psych              Cardiovascular                Exercise tolerance: >4 METS     GI/Hepatic/Renal                  Endo/Other             Other Findings            Physical Exam    Airway  Mallampati: II  TM Distance: 4 - 6 cm  Neck ROM: normal range of motion   Mouth opening: Normal     Cardiovascular  Regular rate and rhythm,  S1 and S2 normal,  no murmur, click, rub, or gallop             Dental  No notable dental hx       Pulmonary  Breath sounds clear to auscultation               Abdominal         Other Findings            Anesthetic Plan    ASA: 2  Anesthesia type: epidural          Induction: Intravenous  Anesthetic plan and risks discussed with: Patient and Spouse

## 2013-12-13 NOTE — Progress Notes (Signed)
Pt hurting with contractions.  CRNA at bedside.    SVE done.  4/90/-2.      Attempted I&O cath.  No urine return.  Will attempt again.

## 2013-12-13 NOTE — Progress Notes (Signed)
Pt requests analgesic, Stadol & phenergan IV given

## 2013-12-13 NOTE — Progress Notes (Signed)
Pt turned onto left lateral side at 1929.    Pt feeling nauseated.   zofran given iv as ordered at 1935.

## 2013-12-13 NOTE — Other (Signed)
Please update patient admit date to 12/13/2013 patient delivered on 12/14/2013 [id number]

## 2013-12-13 NOTE — Progress Notes (Signed)
12/13/13 1436   Cervical Exam   Dilation (cm) 2   Eff 100 %   Station -2   Membrane Status AROM   SVE per Dr Geni Bers, AROM for Mod amt clear fluid.

## 2013-12-13 NOTE — Progress Notes (Signed)
Patient to room 434 for complaints of contractions and possible ROM

## 2013-12-13 NOTE — Progress Notes (Signed)
Epidural bottle empty.  CRNA notified.

## 2013-12-13 NOTE — Progress Notes (Signed)
Dr. Geni Bers called due to spontanous variable declerations down into 90's for 120 seconds. Good return to baseline. SVE 1/80. No change. Order to admit for labor. Dr. Geni Bers will come see pt. Pt in agreement with AROM and augmentation. Also expressed she would like epidural.

## 2013-12-13 NOTE — Progress Notes (Signed)
Pt to triage room 439. Pt c/o contractions that started last night around 1900. Pt was DC from triage early this AM at 0300. Pt stated contractions are stronger now and would like some pain medication. SVE 1/80. Will call MD.

## 2013-12-13 NOTE — Progress Notes (Signed)
Dr. Geni Bers on phone. Report given. Orders received for stadol 1 mg and phenergan 25 mg IM now. Discharge home with labor precautions.

## 2013-12-13 NOTE — Progress Notes (Signed)
Pt repositioned onto right side.  Oxygen off.  Pitocin increased.  Pt denies pain.

## 2013-12-13 NOTE — H&P (Signed)
History & Physical    Name: Brittany Turner MRN: [id number]  SSN: [id number]    Date of Birth: 1985/11/04  Age: 28 y.o.  Sex: female        Subjective:     Estimated Date of Delivery: 12/06/13  OB History   Gravida Para Term Preterm AB SAB TAB Ectopic Multiple Living   3 0 0 0 2 2 0 0 0 0       # Outcome Date GA Lbr Len/2nd Weight Sex Delivery Anes PTL Lv   3 Current            2 SAB 01/04/13 [redacted]w[redacted]d             Comments: blighted ovum   1 SAB 11/11/12 [redacted]w[redacted]d             Comments: chemical          Brittany Turner is admitted with pregnancy at [redacted]w[redacted]d for active labor. Prenatal course was normal. Please see prenatal records for details.    Past Medical History   Diagnosis Date   ??? Asthma      as child, outgrew     Past Surgical History   Procedure Laterality Date   ??? Hx wisdom teeth extraction     ??? Hx colonoscopy  1997     Social History     Occupational History   ??? [employer] - case worker      Martin History Main Topics   ??? Smoking status: Never Smoker    ??? Smokeless tobacco: Never Used   ??? Alcohol Use: No   ??? Drug Use: No   ??? Sexual Activity:     Partners: Male     Patent examiner Protection: None     Family History   Problem Relation Age of Onset   ??? Cancer Brother      neuroblastoma age 74   ??? Cancer Maternal Grandfather      Brain Cancer   ??? Heart Attack Mother      ???   ??? Seizures Brother        Allergies   Allergen Reactions   ??? Ceclor [Cefaclor] Rash   ??? Pcn [Penicillins] Rash     Prior to Admission medications    Medication Sig Start Date End Date Taking? Authorizing Provider   PNV COMBO#47/IRON/FA #1/DHA (PNV-DHA PO) Take  by mouth.    Historical Provider        Review of Systems: A comprehensive review of systems was negative except for that written in the HPI.    Objective:     Vitals:  Filed Vitals:    12/13/13 0957 12/13/13 1055 12/13/13 1138 12/13/13 1330   BP: 132/80 137/81 145/86 130/66   Pulse: 108 118 109 114   Temp:    99.6 ??F (37.6 ??C)   Resp:    20        Physical Exam:  Patient without distress.  Acute discomfort during contractions.  Abdomen: soft, nontender, gravid  Fundus: soft and non tender  Perineum: blood absent, amniotic fluid present  Cervical Exam: 1 cm dilated  (2cm after AROM)  100% effaced    -2 station    Presenting Part: cephalic  Lower Extremities: No pitting edema, no CT  Membranes:  Artificial Rupture of Membranes; Amniotic Fluid: large amount of clear fluid  Fetal Heart Rate: Reactive  Baseline: 140 per minute  Variability: moderate  Accelerations: yes  Decelerations: none  Uterine contractions: present q 1-3 minutes    Prenatal Labs:   Lab Results   Component Value Date/Time    ABO,Rh  A NEG 04/17/2013    Rubella IMMUNE 04/17/2013    GrBS negative 11/09/2013    HBsAg NEG 04/17/2013    HIV NR 04/17/2013    RPR NR 04/17/2013         Assessment/Plan:     Plan: Admit for Reassuring fetal status, Labor  Progressing normally, Continue plan for vaginal delivery.  Group B Strep was negative. Patient had effacement without cervical change, now has cervical change after AROM. Requesting epidural, fluid bolus running in at present.    Signed By:  Laury Axon, MD     December 13, 2013

## 2013-12-13 NOTE — Progress Notes (Signed)
Epidural catheter in place, test dose given, see anesthesia records, serial BP's begin.

## 2013-12-13 NOTE — Progress Notes (Signed)
SVE1/80/ballotable.  nitrazine negative

## 2013-12-13 NOTE — Progress Notes (Signed)
Pt requests epidural, IVB begins.

## 2013-12-13 NOTE — Progress Notes (Signed)
NNP at bedside to discuss baby needing to be in nursery after delivery.  Questions encouraged and answered.

## 2013-12-13 NOTE — Progress Notes (Signed)
Dr beaudoin called to notify of pt temp of 100.6 orally.    Also notified of fetal decel down to 60's x1 min. FHT's returned to baseline with position change and after SVE. (no change.) Oxygen was on via non-rebreather mask and has now been removed.  Pt remains on pitocin.

## 2013-12-13 NOTE — Progress Notes (Signed)
Dr. Geni Bers called with update.  Notified of SVE, FHT's reactive, having contractions every 1-3 minutes on 6 milliunits of pitocin.    Also notified MD that RN has been unable to empty patient's bladder with the I&O cath kit x3 times.  Catheter advances several inches, then hits baby's head and does not advance into bladder.  Order received to place a foley catheter.     No other orders received at this time.

## 2013-12-14 LAB — TYPE & SCREEN
ABO/Rh(D): A NEG
Antibody screen: NEGATIVE

## 2013-12-14 LAB — RT--CORD BLOOD GAS
BASE DEFICIT,CBA: 0.9 mmol/L (ref 0.0–2.0)
HCO3,CORD BLD ARTERIAL: 26 mmol/L (ref 22–26)
PCO2,CORD BLD ARTERIAL: 54 mmHg — ABNORMAL HIGH (ref 33–49)
PH,CORD BLD ARTERIAL: 7.305 (ref 7.21–7.31)
PO2,CORD BLD ARTERIAL: <10 mmHg (ref 9–19)

## 2013-12-14 LAB — TYPE AND SCREEN
ABO/Rh: A NEG
Antibody Screen: NEGATIVE

## 2013-12-14 MED ORDER — LACTATED RINGERS BOLUS IV
Freq: Once | INTRAVENOUS | Status: DC
Start: 2013-12-14 — End: 2013-12-14

## 2013-12-14 MED ORDER — LACTATED RINGERS BOLUS IV
INTRAVENOUS | Status: DC | PRN
Start: 2013-12-14 — End: 2013-12-14

## 2013-12-14 MED ORDER — LACTATED RINGERS IV
INTRAVENOUS | Status: DC
Start: 2013-12-14 — End: 2013-12-15
  Administered 2013-12-14: 18:00:00 via INTRAVENOUS

## 2013-12-14 MED ADMIN — Gentamicin in 0.9% NaCl (GARAMYCIN) IVPB 120 mg: INTRAVENOUS | @ 19:00:00 | NDC 00338050748

## 2013-12-14 MED ADMIN — phenylephrine 100 mcg/mL syringe (for anesthesia use only): INTRAVENOUS | @ 15:00:00 | NDC 00641614201

## 2013-12-14 MED ADMIN — metoclopramide HCl (REGLAN) tablet 10 mg: ORAL | @ 13:00:00 | NDC 68084000911

## 2013-12-14 MED ADMIN — oxytocin (PITOCIN) 30 units/500 ml LR: INTRAVENOUS | @ 08:00:00 | NDC 99991083550

## 2013-12-14 MED ADMIN — ropivacaine (NAROPIN) injection: EPIDURAL | @ 07:00:00 | NDC 70069006225

## 2013-12-14 MED ADMIN — oxytocin (PITOCIN) 30 units/500 ml LR: INTRAVENOUS | @ 14:00:00 | NDC 00078006125

## 2013-12-14 MED ADMIN — oxytocin (PITOCIN) 30 units/500 ml LR: INTRAVENOUS | @ 10:00:00 | NDC 99991083550

## 2013-12-14 MED ADMIN — oxyCODONE IR (ROXICODONE) tablet 10 mg: ORAL | @ 21:00:00 | NDC 42858000110

## 2013-12-14 MED ADMIN — oxytocin (PITOCIN) 30 units/500 ml LR: INTRAVENOUS | NDC 99991083550

## 2013-12-14 MED ADMIN — ropivacaine (NAROPIN) injection: EPIDURAL | @ 08:00:00 | NDC 70069006225

## 2013-12-14 MED ADMIN — acetaminophen (TYLENOL) tablet 1,000 mg: ORAL | @ 01:00:00 | NDC 50580045110

## 2013-12-14 MED ADMIN — famotidine (PEPCID) tablet 20 mg: ORAL | @ 13:00:00 | NDC 51079096601

## 2013-12-14 MED ADMIN — ropivacaine (NAROPIN) injection: EPIDURAL | @ 12:00:00 | NDC 63323028528

## 2013-12-14 MED ADMIN — lidocaine-EPINEPHrine (PF) (XYLOCAINE) 2 %-1:200,000 injection: EPIDURAL | @ 14:00:00 | NDC 00409318301

## 2013-12-14 MED ADMIN — acetaminophen (TYLENOL) tablet 1,000 mg: ORAL | @ 07:00:00 | NDC 50580045110

## 2013-12-14 MED ADMIN — ketorolac (TORADOL) injection 30 mg: INTRAVENOUS | @ 21:00:00 | NDC 00409379501

## 2013-12-14 MED ADMIN — clindamycin phosphate 900 mg in 0.9% sodium chloride (MBP/ADV) 100 mL ADV: INTRAVENOUS | @ 18:00:00 | NDC 00409710167

## 2013-12-14 MED ADMIN — lactated ringers infusion: INTRAVENOUS | @ 14:00:00 | NDC 00409795309

## 2013-12-14 MED ADMIN — ropivacaine (PF) (NAROPIN) 5 mg/mL (0.5 %) injection: PERINEURAL | @ 03:00:00 | NDC 63323028630

## 2013-12-14 MED ADMIN — oxytocin (PITOCIN) 30 units/500 ml LR: INTRAVENOUS | @ 14:00:00 | NDC 99991083550

## 2013-12-14 MED ADMIN — lactated ringers infusion: INTRAVENOUS | @ 12:00:00 | NDC 00409795309

## 2013-12-14 MED ADMIN — oxytocin (PITOCIN) 30 units/500 ml LR: INTRAVENOUS | @ 01:00:00 | NDC 99991083550

## 2013-12-14 MED ADMIN — sodium bicarbonate 8.4 % (1 mEq/mL) injection: INTRAVENOUS | @ 14:00:00 | NDC 00409663734

## 2013-12-14 MED ADMIN — ropivacaine (NAROPIN) injection: EPIDURAL | @ 08:00:00 | NDC 63323028528

## 2013-12-14 MED ADMIN — bupivacaine (PF) (MARCAINE) 0.25 % (2.5 mg/mL) injection: EPIDURAL | @ 07:00:00 | NDC 00409155930

## 2013-12-14 MED ADMIN — morphine (pf) (DURAMORPH;ASTRAMORPH) 0.5 mg/mL injection: EPIDURAL | @ 15:00:00 | NDC 00409381412

## 2013-12-14 MED ADMIN — oxytocin (PITOCIN) 30 units/500 ml LR: INTRAVENOUS | @ 06:00:00 | NDC 99991083550

## 2013-12-14 MED ADMIN — ondansetron (ZOFRAN) injection 4 mg: INTRAVENOUS | @ 07:00:00 | NDC 67457044000

## 2013-12-14 MED ADMIN — acetaminophen (TYLENOL) tablet 1,000 mg: ORAL | @ 18:00:00 | NDC 50580045110

## 2013-12-14 MED ADMIN — ondansetron (ZOFRAN) injection: INTRAVENOUS | @ 14:00:00 | NDC 00781301072

## 2013-12-14 MED ADMIN — Gentamicin in 0.9% NaCl (GARAMYCIN) IVPB 120 mg: INTRAVENOUS | @ 02:00:00 | NDC 00338050748

## 2013-12-14 MED ADMIN — fentaNYL citrate (PF) injection: EPIDURAL | @ 03:00:00 | NDC 00409909422

## 2013-12-14 MED ADMIN — fentaNYL citrate (PF) injection: EPIDURAL | @ 12:00:00 | NDC 00409909422

## 2013-12-14 MED ADMIN — dextrose 5% lactated ringers infusion: INTRAVENOUS | @ 08:00:00 | NDC 00409792909

## 2013-12-14 MED ADMIN — sodium citrate-citric acid (BICITRA) 500-334 mg/5 mL solution 30 mL: ORAL | @ 13:00:00

## 2013-12-14 MED ADMIN — ceFAZolin in 0.9% NS (ANCEF) IVPB soln 2 g: INTRAVENOUS | @ 14:00:00 | NDC 09999966850

## 2013-12-14 MED ADMIN — Gentamicin in 0.9% NaCl (GARAMYCIN) IVPB 120 mg: INTRAVENOUS | @ 10:00:00 | NDC 00338050748

## 2013-12-14 MED ADMIN — clindamycin phosphate 900 mg in 0.9% sodium chloride (MBP/ADV) 100 mL ADV: INTRAVENOUS | @ 01:00:00 | NDC 00409710167

## 2013-12-14 MED ADMIN — phenylephrine 100 mcg/mL syringe (for anesthesia use only): INTRAVENOUS | @ 14:00:00 | NDC 00641614201

## 2013-12-14 MED ADMIN — ropivacaine (NAROPIN) injection: EPIDURAL | @ 03:00:00 | NDC 70069006225

## 2013-12-14 MED ADMIN — lidocaine (PF) (XYLOCAINE) 20 mg/mL (2 %) injection: EPIDURAL | @ 07:00:00 | NDC 00409428202

## 2013-12-14 MED ADMIN — clindamycin phosphate 900 mg in 0.9% sodium chloride (MBP/ADV) 100 mL ADV: INTRAVENOUS | @ 09:00:00 | NDC 00409710167

## 2013-12-14 MED ADMIN — fentaNYL citrate (PF) injection: EPIDURAL | @ 08:00:00 | NDC 00409909422

## 2013-12-14 MED FILL — NAROPIN (PF) 2 MG/ML (0.2 %) INJECTION SOLUTION: 2 mg/mL (0. %) | INTRAMUSCULAR | Qty: 202.6

## 2013-12-14 MED FILL — MARCAINE (PF) 0.25 % (2.5 MG/ML) INJECTION SOLUTION: 0.25 % (2.5 mg/mL) | INTRAMUSCULAR | Qty: 5

## 2013-12-14 MED FILL — DEXTROSE 5%-LACTATED RINGERS IV: INTRAVENOUS | Qty: 1000

## 2013-12-14 MED FILL — CEFAZOLIN 2 GRAM/50 ML NS IVPB: INTRAVENOUS | Qty: 50

## 2013-12-14 MED FILL — LIDOCAINE-EPINEPHRINE PF 2 %-1:200,000 IJ SOLN: 2 %-1:00,000 | INTRAMUSCULAR | Qty: 20

## 2013-12-14 MED FILL — FENTANYL CITRATE (PF) 50 MCG/ML IJ SOLN: 50 mcg/mL | INTRAMUSCULAR | Qty: 2

## 2013-12-14 MED FILL — GENTAMICIN IN SALINE (ISO-OSMOTIC) 120 MG/100 ML IV PIGGY BACK: 120 mg/100 mL | INTRAVENOUS | Qty: 100

## 2013-12-14 MED FILL — PHENYLEPHRINE 10 MG/ML INJECTION: 10 mg/mL | INTRAMUSCULAR | Qty: 1

## 2013-12-14 MED FILL — OXYCODONE 5 MG TAB: 5 mg | ORAL | Qty: 2

## 2013-12-14 MED FILL — CLINDAMYCIN 900 MG/6 ML IV: 900 mg/6 mL | INTRAVENOUS | Qty: 6

## 2013-12-14 MED FILL — METOCLOPRAMIDE 10 MG TAB: 10 mg | ORAL | Qty: 1

## 2013-12-14 MED FILL — NAROPIN (PF) 5 MG/ML (0.5 %) INJECTION SOLUTION: 5 mg/mL (0. %) | INTRAMUSCULAR | Qty: 3

## 2013-12-14 MED FILL — LIDOCAINE (PF) 20 MG/ML (2 %) IJ SOLN: 20 mg/mL (2 %) | INTRAMUSCULAR | Qty: 5

## 2013-12-14 MED FILL — SODIUM BICARBONATE 8.4 % IV: 1 mEq/mL (8.4 %) | INTRAVENOUS | Qty: 1.5

## 2013-12-14 MED FILL — LIDOCAINE-EPINEPHRINE PF 2 %-1:200,000 IJ SOLN: 2 %-1:00,000 | INTRAMUSCULAR | Qty: 15

## 2013-12-14 MED FILL — MAPAP EXTRA STRENGTH 500 MG TABLET: 500 mg | ORAL | Qty: 2

## 2013-12-14 MED FILL — ONDANSETRON (PF) 4 MG/2 ML INJECTION: 4 mg/2 mL | INTRAMUSCULAR | Qty: 2

## 2013-12-14 MED FILL — SODIUM CITRATE-CITRIC ACID 500 MG-334 MG/5 ML ORAL SOLN: 500-334 mg/5 mL | ORAL | Qty: 30

## 2013-12-14 MED FILL — FAMOTIDINE 20 MG TAB: 20 mg | ORAL | Qty: 1

## 2013-12-14 MED FILL — OXYTOCIN 30 UNIT IN 500 ML INFUSION: 30 unit/500 mL | INTRAVENOUS | Qty: 500

## 2013-12-14 MED FILL — MORPHINE (PF) 0.5 MG/ML IJ SOLN: 0.5 mg/mL | INTRAMUSCULAR | Qty: 10

## 2013-12-14 MED FILL — LACTATED RINGERS IV: INTRAVENOUS | Qty: 1000

## 2013-12-14 MED FILL — SODIUM BICARBONATE 8.4 % IV: 1 mEq/mL (8.4 %) | INTRAVENOUS | Qty: 50

## 2013-12-14 MED FILL — KETOROLAC TROMETHAMINE 30 MG/ML INJECTION: 30 mg/mL (1 mL) | INTRAMUSCULAR | Qty: 1

## 2013-12-14 MED FILL — PHENYLEPHRINE 10 MG/ML INJECTION: 10 mg/mL | INTRAMUSCULAR | Qty: 200

## 2013-12-14 NOTE — Progress Notes (Signed)
CRNA notified that epidural line from yesterday still appears to be in place.  That epidural was removed by Dr. Russella Dar prior to insertion of new epidural. Martyn Ehrich CRNA said he will look into it.

## 2013-12-14 NOTE — Progress Notes (Signed)
CRNA notified that pt is still very uncomfortable.  States she will bring pt additional epidural medication.

## 2013-12-14 NOTE — Progress Notes (Signed)
Bedside sbar report given to whitney arnett RN.  Pt care relinquished at this time.

## 2013-12-14 NOTE — Progress Notes (Signed)
Report received from [facility], Therapist, sports.  Patient care assumed.

## 2013-12-14 NOTE — Lactation Note (Signed)
In to see mom. Infant is in [place]. Mom stated that infant latched and nursed well at first feeding and then was admitted to the [place]. Mom has started pumping and reviewed with her to pump every 3 hours. Also plans to got to [facility] at next feeding to attempt to breastfeed. Lactation consultant will follow up in am.

## 2013-12-14 NOTE — Progress Notes (Signed)
Dr. Geni Bers called to verify antibiotic orders. Orders received to continue antibiotics until 10 am tomorrow but can be hep welled in between doses so patient can go see infant.

## 2013-12-14 NOTE — Progress Notes (Signed)
Resting in between contractions.

## 2013-12-14 NOTE — Progress Notes (Signed)
Labor Progress Note  Patient seen, fetal heart rate and contraction pattern evaluated, patient examined by RN and no cervical change noted. Patient requesting cesarean section at this time. Started on gent and clinda last night for chorio with allergy to pcn.   Patient Vitals for the past 1 hrs:   BP Pulse   12/14/13 0906 127/72 mmHg 106   12/14/13 0850 132/73 mmHg 108   12/14/13 0836 129/72 mmHg 103   12/14/13 0821 124/71 mmHg 93       Physical Exam:  Cervical Exam:  7 cm dilated / 70% effaced / -1 station per RN exam @ 0730, cervix palpates swollen  Presenting Part: cephalic  Membranes:  Artificial Rupture of Membranes; Amniotic Fluid: medium amount of clear fluid  Uterine Activity: irregular ctx on monitor now with pitocin off  Fetal Heart Rate: Reactive  Variability: moderate  Accelerations: none at present  Decelerations: none    Assessment/Plan:  Proceed with Cesarean Section Reassuring fetal status with labor not progressing normally, findings consistent with failure of dilation with chorioamnionitis at [redacted]w[redacted]d.  Recommended proceeding with cesarean delivery.  Risks of bleeding, infection, bladder and bowel damage explained to patient and husband.  They understand the situation and consent to the cesarean delivery. Anesthesia notified by RN prior to my arrival.

## 2013-12-14 NOTE — Progress Notes (Signed)
RN offered to position pt on the peanut to assist with labor/descent of baby, pt would rather not use the peanut at this time.

## 2013-12-14 NOTE — Progress Notes (Signed)
Pt's husband requesting a c/s at this time.  RN encouraged pt to continue with laboring and give MDA time to work with epidural to try to get her comfortable.  Pt and husband agreeable to plan of care.

## 2013-12-14 NOTE — Progress Notes (Signed)
New Epidural in place.  Pt positioned on left hip tilt.  CRNA remains at bedside.

## 2013-12-14 NOTE — Progress Notes (Signed)
RN at bedside.  Pt positioned onto right lateral side.  Pt denies needs at this time.  Pt's husband remains at bedside.

## 2013-12-14 NOTE — Progress Notes (Signed)
IV in right hand infiltrated. IV discontinued. New IV started in left forearm with 2 attempts, by T. Hutchins, Therapist, sports. IV line taped to arm and flushed. Currently infusing with antibiotics. Pt denies pain or discomfort at the site. Primary RN at bedside.

## 2013-12-14 NOTE — Progress Notes (Signed)
crna at bedside for epidural redose

## 2013-12-14 NOTE — Progress Notes (Signed)
Pt called out stating pain and pressure. Peanut removed. Sve: 7/75/-2 with caput. Foley draining tea colored urine. Pt repositioned to the right side.

## 2013-12-14 NOTE — Progress Notes (Signed)
CRNA spoke to anesthesiologist.  MDA would like epidural pulled back slightly by CRNA and give additional dose of medication to see if it improves pt's pain level.  Pt rates pain 9/10.  Pt is breathing and crying with every contraction.Brittany Turner

## 2013-12-14 NOTE — Progress Notes (Signed)
SBAR IN Report: Mother    Verbal report received from Kathrin Ruddy, RN (full name & credentials) on this patient, who is now being transferred from [place] (unit) for routine progression of care.  The patient is wearing a green "Anesthesia-Duramorph" band.    Report consisted of patient's Situation, Background, Assessment and Recommendations (SBAR).       Newborn ID bands were compared with the identification form, and verified with the patient and transferring nurse.    Information from the SBAR, Procedure Summary, Intake/Output and MAR and the [facility] Report was reviewed with the transferring nurse; opportunity for questions and clarification provided.

## 2013-12-14 NOTE — Progress Notes (Signed)
Assessment complete. Tylenol given as ordered.

## 2013-12-14 NOTE — Progress Notes (Signed)
Observed patient's IV site for infiltration, redness.  No redness noted, swelling decreasing.  Patient drinking fluids, no pain at this time.  Full assessment performed and completed.  Dressing dry and intact, no drainage noted.  Patient has not complaints of pain at this time.  Vital signs WNL.  Patient not passing flatus.  Reinforced to patient to drink lots of fluids. Patient verbalized understanding.  Encouraged patient to call out with needs.

## 2013-12-14 NOTE — Progress Notes (Signed)
Attended C-section, baby delivered 1008. Baby cried, stimulated and warmed. No oxygen needed but on standby if needed. No delay or complications.

## 2013-12-14 NOTE — Progress Notes (Addendum)
Dr Geni Bers called, updated on pt status. Tea colored urine. Continues to hurt with pain and pressure. Sve: no change with caput noted upon exam. Orders to prepare pt for a c-section.

## 2013-12-14 NOTE — Progress Notes (Signed)
Pt positioned on right hip tilt with peanut between her legs.  Will monitor.

## 2013-12-14 NOTE — Progress Notes (Signed)
Pt complaining of "rectal pressure" and nausea. SVE 6-7/80/0. Pt states she has already used her epidural bolus button. Pt repositioned to semi-fowler's position. Zofran SIVP given per MD order.

## 2013-12-14 NOTE — Progress Notes (Signed)
Dr. Geni Bers called with update.  Notified of FHT's, have been reactive and decel at 0614.  Aware of SVE (7/90/0), Pitocin at 8.  Aware of patient's complaints during the night of pain/pressure with contractions through the night.  At one point pt was asking for a c/s.   Epidural was replaced by Dr. Russella Dar and pt now is resting comfortably. Pt continues on antibiotics.  Two doses of tylenol given during the night.  Aware of recent VS: temp, BP.      MD states she will be here at about 0830 unless notified of need for her to come sooner.  No new orders received.

## 2013-12-14 NOTE — Progress Notes (Signed)
Patient just returned to room from [facility] via husband pushing wheelchair.  Patient able to transfer to bed without assistance.  Right hand IV puffy, but patient, IV fluids flowing without difficulty.  No redness noted, no pain per patient.  Informed patient to lay hand on pillow, and move around periodically, will monitor for infiltration.

## 2013-12-14 NOTE — Progress Notes (Addendum)
CRNA and MDA at bedside for epidural replacement.  Epidural removed by MDA prior to attempting new epidural placement.

## 2013-12-14 NOTE — Progress Notes (Signed)
4888- Baby decel down to 70's x 1.5 minutes.  Pt repositioned onto left lateral side.  SVE done, 7/90/0.  Scalp stim obtained.  FHT's return to 140's with moderate variability.  Will continue to monitor.

## 2013-12-14 NOTE — Progress Notes (Signed)
RN at bedside.  Pt sleeping and unaware of contractions. Will monitor.

## 2013-12-14 NOTE — Progress Notes (Signed)
Dr Geni Bers to be here in about 30 minute. fhr reassuring. Pitocin off, d5lr off, lr bolusing. SCN and anesthesia aware of plan for c-section. Pt's husband at bs.

## 2013-12-14 NOTE — Progress Notes (Signed)
Pt is resting and comfortable with contractions. Rates pain 2/10 with contractions and states it is "so much better than before."  Lights dimmed.  Pt's husband at bedside.

## 2013-12-14 NOTE — Progress Notes (Signed)
Julaine Hua, CRNA at bs to evaluate pt.

## 2013-12-14 NOTE — Progress Notes (Signed)
rn at bedside.  Pt repositioned onto right hip tilt.  Ice chips provided.  Rates pain with contractions 1/10.  Denies needs at this time.

## 2013-12-14 NOTE — Anesthesia Post-Procedure Evaluation (Signed)
Post-Anesthesia Evaluation and Assessment    Patient: Brittany Turner MRN: [id number]  SSN: [id number]    Date of Birth: Jul 18, 1985  Age: 28 y.o.  Sex: female       Cardiovascular Function/Vital Signs  Visit Vitals   Item Reading   ??? BP 117/75   ??? Pulse 92   ??? Temp 37.7 ??C (99.9 ??F)   ??? Resp 18   ??? Ht 5\' 5"  (1.651 m)   ??? Wt 87.091 kg (192 lb)   ??? BMI 31.95 kg/m2   ??? SpO2 97%   ??? Breastfeeding Unknown       Patient is status post epidural anesthesia for Procedure(s):  CESAREAN SECTION.    Nausea/Vomiting: None    Postoperative hydration reviewed and adequate.    Pain:  Pain Scale 1: Numeric (0 - 10) (12/14/13 1202)  Pain Intensity 1: 0 (12/14/13 1202)   Managed    Neurological Status:   Neuro (WDL): Within Defined Limits (12/14/13 1202)   Lower extremities returning to baseline.    Mental Status and Level of Consciousness: Alert and oriented     Pulmonary Status:   O2 Device: Room air (12/14/13 1202)   Adequate oxygenation and airway patent    Complications related to anesthesia: None    Post-anesthesia assessment completed. No concerns    Signed By: Marcy Panning, MD     December 14, 2013

## 2013-12-14 NOTE — Progress Notes (Signed)
Dr Geni Bers here. Proceed with c-section plans. sbar given to Crary, Therapist, sports. Pt assumed for care.

## 2013-12-14 NOTE — Progress Notes (Signed)
Pt positioned on the peanut at this time.

## 2013-12-14 NOTE — Progress Notes (Signed)
Report received from Festus Aloe, RN, care of pt assumed.

## 2013-12-14 NOTE — Other (Signed)
SBAR OUT Report: Mother    Verbal report given to Davis Gourd, RN on this patient, who is now being transferred to MI (unit) for routine progression of care.   The patient is wearing a green "Anesthesia-Duramorph" band.    Report consisted of patient's Situation, Background, Assessment and Recommendations (SBAR).       Newborn ID bands were compared with the identification form, and verified with the patient and receiving nurse.    Information from the Intake/Output and MAR and the [place] Report was reviewed with the receiving nurse; opportunity for questions and clarification provided.

## 2013-12-14 NOTE — Progress Notes (Signed)
CRNA spoke to [facility].  Dr. Russella Dar plans to come replace epidural. Pt rates pain 9/10.

## 2013-12-14 NOTE — Progress Notes (Signed)
Pt still crying with every contraction.  Pt states she does not feel pain, just pressure.  CRNA called to notify.  CRNA states she will call Dr. Russella Dar to come replace the epidural.

## 2013-12-14 NOTE — Progress Notes (Signed)
Dr Adelene Amas called to notify of C/S called.  [place] to be here at 0830.

## 2013-12-14 NOTE — Progress Notes (Signed)
CRNA at bedside.

## 2013-12-15 LAB — HGB & HCT
HCT: 31.3 % — ABNORMAL LOW (ref 35.8–46.3)
HGB: 10.3 g/dL — ABNORMAL LOW (ref 11.7–15.4)

## 2013-12-15 LAB — FETAL SCREEN: Fetal screen: NEGATIVE

## 2013-12-15 MED ORDER — LACTATED RINGERS IV
INTRAVENOUS | Status: DC
Start: 2013-12-15 — End: 2013-12-15

## 2013-12-15 MED ADMIN — simethicone (MYLICON) tablet 80 mg: ORAL | @ 15:00:00 | NDC 63739022510

## 2013-12-15 MED ADMIN — acetaminophen (TYLENOL) tablet 1,000 mg: ORAL | @ 17:00:00 | NDC 50580045110

## 2013-12-15 MED ADMIN — oxyCODONE IR (ROXICODONE) tablet 10 mg: ORAL | @ 12:00:00 | NDC 42858000110

## 2013-12-15 MED ADMIN — simethicone (MYLICON) tablet 80 mg: ORAL | @ 20:00:00 | NDC 63739022510

## 2013-12-15 MED ADMIN — Gentamicin in 0.9% NaCl (GARAMYCIN) IVPB 120 mg: INTRAVENOUS | @ 03:00:00 | NDC 00338050748

## 2013-12-15 MED ADMIN — clindamycin phosphate 900 mg in 0.9% sodium chloride (MBP/ADV) 100 mL ADV: INTRAVENOUS | @ 03:00:00 | NDC 00409710167

## 2013-12-15 MED ADMIN — ketorolac (TORADOL) injection 30 mg: INTRAVENOUS | @ 12:00:00 | NDC 00409379501

## 2013-12-15 MED ADMIN — clindamycin phosphate 900 mg in 0.9% sodium chloride (MBP/ADV) 100 mL ADV: INTRAVENOUS | @ 11:00:00 | NDC 00409710167

## 2013-12-15 MED ADMIN — acetaminophen (TYLENOL) tablet 1,000 mg: ORAL | @ 02:00:00 | NDC 50580045110

## 2013-12-15 MED ADMIN — Rho D immune globulin (RHOGAM) 1,500 unit (300 mcg) injection 0.3 mg: INTRAMUSCULAR | @ 13:00:00 | NDC 00562780500

## 2013-12-15 MED ADMIN — acetaminophen (TYLENOL) tablet 1,000 mg: ORAL | @ 11:00:00 | NDC 50580045110

## 2013-12-15 MED ADMIN — oxyCODONE IR (ROXICODONE) tablet 10 mg: ORAL | @ 06:00:00 | NDC 68084035411

## 2013-12-15 MED ADMIN — Gentamicin in 0.9% NaCl (GARAMYCIN) IVPB 120 mg: INTRAVENOUS | @ 10:00:00 | NDC 00338050748

## 2013-12-15 MED ADMIN — ketorolac (TORADOL) injection 30 mg: INTRAVENOUS | @ 06:00:00 | NDC 00409379501

## 2013-12-15 MED ADMIN — acetaminophen (TYLENOL) tablet 1,000 mg: ORAL | @ 22:00:00 | NDC 50580045110

## 2013-12-15 MED ADMIN — docusate sodium (COLACE) capsule 100 mg: ORAL | @ 22:00:00 | NDC 62584068311

## 2013-12-15 MED ADMIN — ibuprofen (MOTRIN) tablet 800 mg: ORAL | @ 17:00:00 | NDC 68084077211

## 2013-12-15 MED ADMIN — docusate sodium (COLACE) capsule 100 mg: ORAL | @ 15:00:00 | NDC 62584068311

## 2013-12-15 MED FILL — GENTAMICIN IN SALINE (ISO-OSMOTIC) 120 MG/100 ML IV PIGGY BACK: 120 mg/100 mL | INTRAVENOUS | Qty: 100

## 2013-12-15 MED FILL — CLINDAMYCIN 900 MG/6 ML IV: 900 mg/6 mL | INTRAVENOUS | Qty: 6

## 2013-12-15 MED FILL — MAPAP EXTRA STRENGTH 500 MG TABLET: 500 mg | ORAL | Qty: 2

## 2013-12-15 MED FILL — SIMETHICONE 80 MG CHEWABLE TAB: 80 mg | ORAL | Qty: 1

## 2013-12-15 MED FILL — KETOROLAC TROMETHAMINE 30 MG/ML INJECTION: 30 mg/mL (1 mL) | INTRAMUSCULAR | Qty: 1

## 2013-12-15 MED FILL — OXYCODONE 5 MG TAB: 5 mg | ORAL | Qty: 2

## 2013-12-15 MED FILL — HYPERRHO S/D 1,500 UNIT (300 MCG) INTRAMUSCULAR SYRINGE: 1500 unit (300 mcg) | INTRAMUSCULAR | Qty: 1

## 2013-12-15 MED FILL — DOCUSATE SODIUM 100 MG CAP: 100 mg | ORAL | Qty: 1

## 2013-12-15 MED FILL — IBUPROFEN 800 MG TAB: 800 mg | ORAL | Qty: 1

## 2013-12-15 NOTE — Progress Notes (Signed)
PM assessment completed per protocol, plan of care discussed with patient. Patient denies any complaints. Instructed to call for any needs or questions. Voices understanding.

## 2013-12-15 NOTE — Progress Notes (Signed)
Report from Philippa Chester

## 2013-12-15 NOTE — Progress Notes (Signed)
Bedside report received from [facility], RN. Care of patient assumed at this time.

## 2013-12-15 NOTE — Progress Notes (Signed)
Motrin 800 mg given po for complaints of incisional pain.

## 2013-12-15 NOTE — Progress Notes (Signed)
Anesthesiology C-Section Post-op Note    Post-op day 1 s/p C-section via epidural with neuraxial opioids for post-op pain management.  BP 136/91   Pulse 107   Temp(Src) 37.1 ??C (98.7 ??F)   Resp 20   Ht 5\' 5"  (1.651 m)   Wt 87.091 kg (192 lb)   BMI 31.95 kg/m2   SpO2 97%   LMP 03/01/2013   Breastfeeding? Unknown  Airway patent, patient appropriately hydrated and appears euvolemic.  Patient is Alert and oriented.  Pain is well controlled.  Pruritus is absent.  Nausea is absent.  No complaints about back or site of injection.  Motor and sensory function has returned to baseline in lower extremities. Patient is satisfied with anesthetic and reports no complications.  Continue current orders, then initiate surgeon's orders for pain management 24 hours after C-section.  Follow up per surgeon.

## 2013-12-15 NOTE — Lactation Note (Addendum)
In to check on feedings. Infant now in room with mother. Mother states infant has been latching well. Infant nursing on right breast in cross cradle position. Good positioning noted. Good latch noted. Demonstrated manual lip flange. Mom states infant nursed x25 min on left breast, burped, and then latched to right breast. Discussed waking measures. Encouraged on demand feedings, watch for feeding cues, and wake infant for feeds if needed. Discussed baby's second night and normalcy of cluster feeding. Discussed need for minimum of 8 feedings in 24 hours and watch output. Mom states nipples are short. Nipple noted to be round and elongated after feeding on right breast. Reviewed and demonstrated hand expression. Mom denies further questions. Lactation to follow up tomorrow.

## 2013-12-15 NOTE — Progress Notes (Signed)
Post-Partum Day Number 1 Progress Note    Patient doing well post-partum without significant complaint.  Voiding without difficulty, normal lochia.    Vitals:  Patient Vitals for the past 8 hrs:   BP Temp Pulse Resp   12/15/13 1453 123/71 mmHg 98.6 ??F (37 ??C) 100 18     Temp (24hrs), Avg:98.5 ??F (36.9 ??C), Min:98.3 ??F (36.8 ??C), Max:98.7 ??F (37.1 ??C)      Vital signs stable, afebrile.    Exam:  Patient without distress.               Abdomen soft, fundus firm at level of umbilicus, nontender               Perineum with normal lochia noted.               Lower extremities are negative for swelling, cords or tenderness.    Lab/Data Review:  All lab results for the last 24 hours reviewed.    Assessment and Plan:  Patient appears to be having uncomplicated post-partum course.  Continue routine perineal care and maternal education.  Plan discharge tomorrow if no problems occur.

## 2013-12-15 NOTE — Progress Notes (Signed)
Resting quietly. No complaints or needs at this time.

## 2013-12-15 NOTE — Progress Notes (Signed)
Patient ambulated to bathroom.  Voided 360 ml blood tinged urine.  Tolerated well.  Steady gait observed.

## 2013-12-15 NOTE — Progress Notes (Signed)
Delivery summary providers updated by this RN during audit; correct RN circulating delivery entered into record: Kathrin Ruddy, RN

## 2013-12-16 MED ORDER — IBUPROFEN 800 MG TAB
800 mg | ORAL_TABLET | Freq: Four times a day (QID) | ORAL | Status: DC | PRN
Start: 2013-12-16 — End: 2015-04-07

## 2013-12-16 MED ORDER — OXYCODONE 5 MG TAB
5 mg | ORAL_TABLET | ORAL | Status: DC | PRN
Start: 2013-12-16 — End: 2015-04-07

## 2013-12-16 MED ORDER — ONDANSETRON 8 MG TAB, RAPID DISSOLVE
8 mg | ORAL_TABLET | Freq: Three times a day (TID) | ORAL | Status: AC | PRN
Start: 2013-12-16 — End: 2013-12-21

## 2013-12-16 MED ADMIN — acetaminophen (TYLENOL) tablet 1,000 mg: ORAL | @ 10:00:00 | NDC 50580045110

## 2013-12-16 MED ADMIN — simethicone (MYLICON) tablet 80 mg: ORAL | @ 01:00:00 | NDC 63739022510

## 2013-12-16 MED ADMIN — ibuprofen (MOTRIN) tablet 800 mg: ORAL | @ 01:00:00 | NDC 68084077211

## 2013-12-16 MED ADMIN — ibuprofen (MOTRIN) tablet 800 mg: ORAL | @ 14:00:00 | NDC 68084077211

## 2013-12-16 MED ADMIN — docusate sodium (COLACE) capsule 100 mg: ORAL | @ 13:00:00 | NDC 63739008901

## 2013-12-16 MED ADMIN — simethicone (MYLICON) tablet 80 mg: ORAL | @ 12:00:00 | NDC 63739022510

## 2013-12-16 MED ADMIN — acetaminophen (TYLENOL) tablet 1,000 mg: ORAL | @ 04:00:00 | NDC 50580045110

## 2013-12-16 MED ADMIN — diph,Pertuss(AC),Tet Vac-PF (BOOSTRIX) suspension 0.5 mL: INTRAMUSCULAR | @ 14:00:00 | NDC 58160084201

## 2013-12-16 MED ADMIN — ibuprofen (MOTRIN) tablet 800 mg: ORAL | @ 08:00:00 | NDC 68084077211

## 2013-12-16 MED FILL — BOOSTRIX TDAP 2.5 LF UNIT-8 MCG-5 LF/0.5 ML INTRAMUSCULAR SUSPENSION: INTRAMUSCULAR | Qty: 1

## 2013-12-16 MED FILL — IBUPROFEN 800 MG TAB: 800 mg | ORAL | Qty: 1

## 2013-12-16 MED FILL — MAPAP EXTRA STRENGTH 500 MG TABLET: 500 mg | ORAL | Qty: 2

## 2013-12-16 MED FILL — DOCUSATE SODIUM 100 MG CAP: 100 mg | ORAL | Qty: 1

## 2013-12-16 MED FILL — SIMETHICONE 80 MG CHEWABLE TAB: 80 mg | ORAL | Qty: 1

## 2013-12-16 NOTE — Lactation Note (Signed)
This note was copied from the chart of [place].  In to see mom and baby for lactation visit.  Patients discharging today.  Mom reports baby has been nursing well through the night.  Reports her nipples are starting to get a little sore and describes some initial latch-on tenderness.  Reviewed proper positioning, latching techniques and lip flange.  Mom has nipple butter that she will begin using after feedings.  Advised to call us with any nipple damage like cracks, blisters or bleeding.  Discussed importance of feeding baby on demand, and at least 8 times in 24 hours, waking baby if needed.  Waking techniques discussed. Reviewed packet and discussed need to watch output.  Has manual pump.  Discussed using this after some feedings for stimulation or to begin storing milk.  Also instructed on hand expression techniques.  Mom is exploring options for purchasing electric pump but will be staying home with the baby.  Advised to follow-up with pediatrician as directed or earlier with any problems such as refused feedings, decreased output, signs of jaundice etc.  Also encouraged to call us with any breastfeeding questions.  Mom voices understanding of all.

## 2013-12-16 NOTE — Progress Notes (Signed)
Motrin 800 mg given po for complaints of incisional discomfort.

## 2013-12-16 NOTE — Progress Notes (Signed)
Post-Operative Day Number 2 Progress Note    Patient doing well post-op day 2 from cesarean delivery without significant complaints.  Pain controlled on current medication.  Voiding without difficulty, normal lochia.    Vitals:  No data found.    Temp (24hrs), Avg:98.6 ??F (37 ??C), Min:98.6 ??F (37 ??C), Max:98.6 ??F (37 ??C)      Vital signs stable, afebrile.    Exam:  Patient without distress.               Abdomen soft, fundus firm at level of umbilicus, non tender.                Incision dry and clean without erythema.               Lower extremities are negative for swelling, cords or tenderness.    Lab/Data Review:  All lab results for the last 24 hours reviewed.    Assessment and Plan:  Patient appears to be having uncomplicated post-cesarean course.  Continue routine post-op care and maternal education.  Discharge today if desired and no problems occur.

## 2013-12-16 NOTE — Progress Notes (Signed)
Patient discharged home via wheelchair.

## 2013-12-16 NOTE — Lactation Note (Signed)
This note was copied from the chart of [place].  Mom and baby are going home today.  Continue to offer the breast without restriction.  Mom's milk should be fully in over the next few days.  Reviewed engorgement precautions.  Hand Expression has been demoed and written hand-out reviewed.  As milk comes in baby will be more alert at the breast and swallows will be more obvious.  Breasts may feel softer once baby has finished nursing.  Baby should be back to birth weight by 64 weeks of age.  And then gain on average 1 oz per day for the next 2-3 months.  Reviewed babies should be exclusively breastfeeding for the first 6 months and that breastfeeding should continue after introduction of appropriate complimentary foods after 6 months.  Initial output should be at least 1 wet and 1 bowel movement for each day old baby is.  By day 5-7 once milk is fully in baby will consistently have 6 or more soaking wet diapers and about 4 bowel movement.  Some babies have a bowel movement with every feeding and some have 1-3 large bowel movements each day.  Inadequate output may indicate inadequate feedings and should be reported to your Pediatrician.  Bowel habits may change as baby gets older.  Encouraged follow-up at Pediatrician in 1-2 days, no later than 1 week of age.  Call [facility] for any questions as needed or to set up an OP visit.  OP phone calls are returned within 24 hours.  Breastfeeding Support Group is offered here monthly.  Community Breastfeeding Resource List given.

## 2013-12-16 NOTE — Progress Notes (Signed)
Discharge instructions completed.  Patient voiced understanding.

## 2013-12-16 NOTE — Progress Notes (Signed)
Tylenol 1000 mg given po per order.

## 2013-12-16 NOTE — Progress Notes (Signed)
Report from Presley Raddle

## 2013-12-21 MED ORDER — NEWMAN'S NIPPLE OINTMENT AMB ONLY
Freq: Three times a day (TID) | Status: DC
Start: 2013-12-21 — End: 2015-04-07

## 2013-12-21 NOTE — Telephone Encounter (Signed)
Patient saw lacation and they recommend Newmans nipple cream. Per verbal order from  Allene Dillon rx called into her pharmacy.

## 2013-12-21 NOTE — Lactation Note (Signed)
Outpatient Feeding Plan for Breastfeeding  [phone]  Patient: Brittany Turner  Gestational Age: 28 w 1 d  Diagnosis:  V 24.1 Damaged nipples  Baby Boy Brittany Turner  Pediatrician Dr. Baird Cancer [facility]  OB/GYN Dr. Grant Ruts  Start Time:  1200  Stop Time:  1300    Good, active breastfeeding is when baby is alert, tugging the nipple in long, deep pulls, and you can hear swallows every 1-2 sucks.  By now Mom's milk should be in.  Brief, light or shallow sucks or short rapid sucks without frequent swallows should not be considered a full breastfeeding.    1. Complete the following mouth exercises prior to feeding:  Gum Massage and Non-nutritive Sucking    2. Put the baby to the breast on demand, at least 8 times per day for 15-20 minutes per side.  Can break when swallowing slows and go back to same side a max of 20 minutes.  Finish first side before offering other side.    Use: Breast Compression and Breast Massage    Estimated Needs:  Baby needs 20-22 oz of breast milk/formula per day based on 8-10 feedings per day.  Baby needs 60-75 ml/2-2.5 oz of breast milk/formula per feeding.    Date Weight Comments   12-14-13 8-5 Birthweight   12-16-13 8-1 Discharge Weight   12-18-13 8-3 At [facility]   12-21-13 8-8.1 Naked Here     Normal weight gain for the first 3 months is at least 1/2-1oz per day regaining birth weight by 2 weeks.    Date Side Position Time Before Wt. After Wt Total   12-21-13 R Cross cradle 1205 17 min  1225 5 min [id number]  3902 22  8 ml  Total: 30 ml      1233 17 min  3924 22 ml  Total: 52 ml ~ 1.7 oz     Handouts given: Tongue Tie.  Dr. Pollie Friar nipple cream info.    Baby last ate at 41 a for 5 min on R.  At 10 a ate on L for 30 min.  Has been eating every 2 hours on 1 side only for 30 minutes.  Plans to exclusively breastfeed.  Does not own a pump.    Baby latches and swallows well for the first few minutes.  Improved positioning and used manual lip flange.  Great weight gain so far.  Baby eats very  frequently.  At this feeding he took less than 2 oz, which doesn't exactly match up to his great weight gain.  One feeding is not necessarily a trend.  However, mom's nipples are extremely damaged and he does a lot of jaw excursions and keeps his tongue back some.  Suggest close follow up on continued weight gain and mom's healing.  Baby did not settle well after this feeding.    Baby's    Oral Digital Assessment:  Tongue curls up but does not lift to roof of mouth or follow finger.  Compresses.  Does some pulling, but a lot of compression.  Tongue slips off gum.   Output:  Soaking wets.  Runny, yellow, seedy, daily stools.    Mom's    Nipples:  L side is scabbed.  Black scab at point of lipstick.  Face of both nipples is completley scabbed over.  Lipstick shaped on release.  L was bleeding when baby was nursing.   Breasts:  Medium.  Not engorged.     Nipple damaged.  Rinse nipple  with warm water after feeding.  Air/pat dry.  Alternate bacitracin and Lanolin until healed.  Discussed olive oil before pumping.  Call as needed or if not improving.  Call physician for immediate concerns or to discuss recommendations.  Dr. Pollie Friar from [facility] as needed.    Plan:  Continue with on demand feeding.  Offer both sides at each feeding.  Work to heal nipples.  Watch for continued or non-healing damage.  May need to have lingual frenulum evaluated.    Follow-up:  To Ped 12-23-13.  Will call Friday 12-25-13.  Call as needed before.    Breastfeeding Support Group: 2nd Monday of most months from 10a-11a.  [id number] in [address]. Support Group is free, but please register that you plan to attend at [email or web address] or by calling [phone].

## 2013-12-21 NOTE — Lactation Note (Signed)
12/21/2013  Re: [name], [name] DOB [birth year])    Dear Dr. Baird Cancer,     I saw Brittany Turner and his mother Brittany Turner in our Lactation office today.  Mom has been exclusively breastfeeding, but her nipples are extremely damaged.    Date Weight Comments   12-14-13 8-5 Birthweight   12-16-13 8-1 Discharge Weight   12-18-13 8-3 At [facility]   12-21-13 8-8.1 Naked Here     Feed and Weigh  1st Breast for 22 min - 30 ml  2nd Breast for 17 min - 22 ml  Total intake at breast ??? 52 ml = ~ 1.7 oz     Baby latches and swallows well for the first few minutes. Improved positioning and used manual lip flange. Great weight gain so far. Baby eats very frequently. At this feeding he took less than 2 oz, which doesn't exactly match up to his great weight gain. One feeding is not necessarily a trend. However, mom's nipples are extremely damaged and he does a lot of jaw excursions and keeps his tongue back some. Suggest close follow up on continued weight gain and mom's healing. Baby did not settle well after this feeding.    Estimated Needs: Baby needs 20-22 oz of breast milk/formula per day based on 8-10 feedings per day. Baby needs 60-75 ml/2-2.5 oz of breast milk/formula per feeding.    Plan: Continue with on demand feeding. Offer both sides at each feeding. Work to heal nipples. Watch for continued or non-healing damage. May need to have lingual frenulum evaluated.    Follow-up:  To Ped 12-23-13. Will call Friday 12-25-13. Call as needed before.    Sincerely,      Duffy Rhody, [place], [place], [place]

## 2014-01-26 LAB — AMB POC URINALYSIS DIP STICK MANUAL W/ MICRO
Bilirubin (UA POC): NEGATIVE
Glucose (UA POC): NEGATIVE
Ketones (UA POC): NEGATIVE
Nitrites (UA POC): NEGATIVE
Protein (UA POC): NEGATIVE mg/dL
Specific gravity (UA POC): 1.015 (ref 1.001–1.035)
Urobilinogen (UA POC): 0.2 (ref 0.2–1)
pH (UA POC): 5 (ref 4.6–8.0)

## 2014-01-26 NOTE — Progress Notes (Signed)
HISTORY OF PRESENT ILLNESS  Brittany Turner is a 28 y.o. female.  HPI  .  Subjective: This 28 y.o. female presents today for a post-partum visit after PLTCD.    Delivery Method: PLTCD  Status of Baby: Disposition of baby: home. Baby is Breast fed.    Postpartum Contraception: none      Past Medical History   Diagnosis Date   ??? Asthma      as child, outgrew     Past Surgical History   Procedure Laterality Date   ??? Hx wisdom teeth extraction     ??? Hx colonoscopy  1997     History     Social History   ??? Marital Status: MARRIED     Spouse Name: Merry Proud     Number of Children: N/A   ??? Years of Education: N/A     Occupational History   ??? [employer] - case worker      [place] History Main Topics   ??? Smoking status: Never Smoker    ??? Smokeless tobacco: Never Used   ??? Alcohol Use: No   ??? Drug Use: No   ??? Sexual Activity:     Partners: Male     Museum/gallery curator: None     Other Topics Concern   ??? Caffeine Concern No   ??? Special Diet No     Mostly vegetarian   ??? Exercise Yes     Runs less since increased in things to do   ??? Seat Belt Yes     Social History Narrative    Never had abnormal pap    No STDs     BP 112/76 mmHg   Pulse 93   Ht 5\' 5"  (1.651 m)   Wt 165 lb (74.844 kg)   BMI 27.46 kg/m2   SpO2 97%   LMP 03/01/2013   Breastfeeding? Yes       Review of Systems   Constitutional:        Weight gain   HENT: Negative.    Eyes: Negative.    Respiratory: Negative.    Cardiovascular: Negative.    Gastrointestinal: Negative.    Genitourinary:        Breast pain or tenderness   Musculoskeletal: Negative.    Skin: Negative.    Neurological: Negative.    Endo/Heme/Allergies: Negative.    Psychiatric/Behavioral: Negative.        Physical Exam   Constitutional: She is oriented to person, place, and time. Vital signs are normal. She appears well-developed and well-nourished. She is active and cooperative. She is easily aroused.  Non-toxic appearance. She does  not have a sickly appearance. She does not appear ill. No distress.   HENT:   Head: Normocephalic and atraumatic.   Nose: Nose normal.   Eyes: EOM are normal. Pupils are equal, round, and reactive to light.   Neck: Normal range of motion. Neck supple.   Cardiovascular: Normal rate.    Pulmonary/Chest: Effort normal. No respiratory distress.   Abdominal: Soft. Bowel sounds are normal. She exhibits no distension. There is no hepatosplenomegaly. There is no tenderness. There is no rebound, no guarding and no CVA tenderness.       Musculoskeletal: Normal range of motion.   Neurological: She is alert, oriented to person, place, and time and easily aroused. She exhibits normal muscle tone. Coordination normal.   Skin: Skin is warm, dry and intact. No rash noted. She is not diaphoretic. No  erythema. No pallor.   Psychiatric: She has a normal mood and affect. Her speech is normal and behavior is normal. Judgment and thought content normal.   Vitals reviewed.      ASSESSMENT and PLAN  Encounter Diagnoses   Name Primary?   ??? Postpartum care and examination Yes     Orders Placed This Encounter   ??? AMB POC URINALYSIS DIP STICK MANUAL W/ MICRO     Doing well postpartum without signs of pp depression. Denies sx of mastitis, declines breast exam. Infant doing well also. RTC when due for annual exam.    Follow-up Disposition:  Return in about 10 months (around 11/27/2014), or if symptoms worsen or fail to improve, for Annual Exam.

## 2014-01-26 NOTE — Patient Instructions (Signed)
MyChart Activation    Thank you for requesting access to MyChart. Please follow the instructions below to securely access and download your online medical record. MyChart allows you to send messages to your doctor, view your test results, renew your prescriptions, schedule appointments, and more.    How Do I Sign Up?    1. In your internet browser, go to [email or web address]  2. Click on the First Time User? Click Here link in the Sign In box. You will be redirect to the New Member Sign Up page.  3. Enter your MyChart Access Code exactly as it appears below. You will not need to use this code after you???ve completed the sign-up process. If you do not sign up before the expiration date, you must request a new code.    MyChart Access Code: Activation code not generated  Current MyChart Status: Active (This is the date your MyChart access code will expire)    4. Enter the last four digits of your Social Security Number (xxxx) and Date of Birth (mm/dd/yyyy) as indicated and click Submit. You will be taken to the next sign-up page.  5. Create a MyChart ID. This will be your MyChart login ID and cannot be changed, so think of one that is secure and easy to remember.  6. Create a MyChart password. You can change your password at any time.  7. Enter your Password Reset Question and Answer. This can be used at a later time if you forget your password.   8. Enter your e-mail address. You will receive e-mail notification when new information is available in MyChart.  9. Click Sign Up. You can now view and download portions of your medical record.  10. Click the Download Summary menu link to download a portable copy of your medical information.    Additional Information    If you have questions, please visit the Frequently Asked Questions section of the MyChart website at [email or web address] Remember, MyChart is NOT to be used for urgent needs. For medical emergencies, dial 911.

## 2015-04-07 ENCOUNTER — Ambulatory Visit
Admit: 2015-04-07 | Discharge: 2015-04-07 | Payer: BLUE CROSS/BLUE SHIELD | Attending: Family Medicine | Primary: Family Medicine

## 2015-04-07 DIAGNOSIS — D229 Melanocytic nevi, unspecified: Secondary | ICD-10-CM

## 2015-04-07 NOTE — Patient Instructions (Signed)
Moles: Care Instructions  Your Care Instructions  Moles are skin growths made up of cells that produce color (pigment). A mole can appear anywhere on the skin, alone or in groups. Most people get a few moles during their first 20 years of life. They are usually brown in color but can be blue, black, or flesh-colored. Most moles are harmless and do not cause pain or other symptoms, unless you rub them or they bump against something.  You usually do not need treatment for moles. But some can turn into cancer. Talk to your doctor if a mole bleeds, itches, burns, or changes size or color. Also let your doctor know if you get a new mole. Make sure to wear sunscreen and other sun protection every day to help prevent skin cancer.  Follow-up care is a key part of your treatment and safety. Be sure to make and go to all appointments, and call your doctor if you are having problems. It's also a good idea to know your test results and keep a list of the medicines you take.  How can you care for yourself at home?  ?? Check all the skin on your body once a month for skin growths or other changes, such as in the color and feel of the skin.  ?? Stand in front of a full-length mirror. Look carefully at the front and back of your body. Then look at your right and left sides with your arms raised.  ?? Bend your elbows and look carefully at your forearms, the back of your upper arms, and your palms.  ?? Look at your feet, the bottoms of your feet, and the spaces between your toes.  ?? Use a hand mirror to look at the back of your legs, the back of your neck, and your back, rear end (buttocks), and genital area. Part the hair on your head to look at your scalp.  ?? If you see a change in a skin growth, contact your doctor. Look for:  ?? A mole that bleeds.  ?? A fast-growing mole.  ?? A scaly or crusted growth on the skin.  ?? A sore that will not heal.  To prevent skin cancer   ?? Always wear sunscreen on exposed skin. Make sure to use a broad-spectrum sunscreen that has a sun protection factor (SPF) of 30 or higher. Use it every day, even when it is cloudy.  ?? Wear a wide-brimmed hat and long sleeves and pants if you are going to be outdoors for very long.  ?? Avoid the sun between 10 a.m. and 4 p.m., which is the peak time for the sun's ultraviolet rays.  ?? Avoid sunburns, tanning booths, and sunlamps.  ?? Be sure to protect children from the sun. Sunburns in childhood damage the skin and increase the risk of cancer.  When should you call for help?  Watch closely for changes in your health, and be sure to contact your doctor if:  ?? A mole looks different than it did before. It may have changed in size, color, shape, or the way it looks.  ?? You have a new mole.  ?? You have a new pimple or skin growth that does not go away.  Where can you learn more?  Go to http://www.healthwise.net/GoodHelpConnections  Enter M489 in the search box to learn more about "Moles: Care Instructions."  ?? 2006-2016 Healthwise, Incorporated. Care instructions adapted under license by [employer] (which disclaims liability or warranty for this   information). This care instruction is for use with your licensed healthcare professional. If you have questions about a medical condition or this instruction, always ask your healthcare professional. Healthwise, Incorporated disclaims any warranty or liability for your use of this information.  Content Version: 11.0.578772; Current as of: August 13, 2014

## 2015-04-07 NOTE — Progress Notes (Signed)
HISTORY OF PRESENT ILLNESS  Brittany Turner is a 29 y.o. female.    Moles over the years has been watching them - one on chest has changed    Brother has had melanoma - had cancer when he was younger (neuroblastoma)      Skin Problem   The history is provided by the patient. This is a new problem. The current episode started more than 1 week ago. The problem occurs constantly. The problem has not changed since onset.Pertinent negatives include no chest pain, no abdominal pain, no headaches and no shortness of breath. Nothing aggravates the symptoms. Nothing relieves the symptoms.       Review of Systems   Constitutional: Negative for chills, fever and malaise/fatigue.   HENT: Negative for congestion.    Eyes: Negative for blurred vision.   Respiratory: Negative for cough, shortness of breath and wheezing.    Cardiovascular: Negative for chest pain and palpitations.   Gastrointestinal: Negative for abdominal pain, nausea and vomiting.   Genitourinary: Negative for dysuria, frequency and urgency.   Musculoskeletal: Negative for joint pain and myalgias.   Neurological: Negative for dizziness, weakness and headaches.   Psychiatric/Behavioral: The patient is not nervous/anxious and does not have insomnia.      Past Medical History   Diagnosis Date   ??? Asthma      as child, outgrew     Past Surgical History   Procedure Laterality Date   ??? Hx wisdom teeth extraction     ??? Hx colonoscopy  1997     Family History   Problem Relation Age of Onset   ??? Cancer Brother      neuroblastoma age 66   ??? Cancer Maternal Grandfather      Brain Cancer   ??? Heart Attack Mother      ???   ??? Seizures Brother      Family Status   Relation Status   ??? Mother Alive   ??? Father Alive   ??? Sister Alive   ??? Brother Alive    x2      Social History     Social History   ??? Marital status: MARRIED     Spouse name: Merry Proud   ??? Number of children: N/A   ??? Years of education: N/A     Occupational History   ??? [employer] - case worker [employer]      Social History Main Topics   ??? Smoking status: Never Smoker   ??? Smokeless tobacco: Never Used   ??? Alcohol use No   ??? Drug use: No   ??? Sexual activity: Yes     Partners: Male     Birth control/ protection: None     Other Topics Concern   ??? Caffeine Concern No   ??? Special Diet No     Mostly vegetarian   ??? Exercise Yes     Runs less since increased in things to do   ??? Seat Belt Yes     Social History Narrative    Never had abnormal pap    No STDs       Allergies   Allergen Reactions   ??? Ceclor [Cefaclor] Rash   ??? Pcn [Penicillins] Rash     Current Outpatient Prescriptions   Medication Sig   ??? PNV COMBO#47/IRON/FA #1/DHA (PNV-DHA PO) Take  by mouth.     No current facility-administered medications for this visit.  Problem List  Date Reviewed: 04/07/2015          Codes Class Noted    Rh negative status during pregnancy ICD-10-CM: O09.899  ICD-9-CM: V23.89  06/22/2013              Blood pressure 106/78, pulse 85, temperature 98.9 ??F (37.2 ??C), temperature source Oral, resp. rate 18, height 5\' 5"  (1.651 m), weight 173 lb 9.6 oz (78.7 kg), SpO2 99 %, currently breastfeeding.       Body mass index is 28.89 kg/(m^2).   Physical Exam   Constitutional: She appears well-developed and well-nourished. No distress.   Eyes: Conjunctivae and EOM are normal. Pupils are equal, round, and reactive to light. Right eye exhibits no discharge. Left eye exhibits no discharge.   Cardiovascular: Normal rate, regular rhythm, normal heart sounds and intact distal pulses.  Exam reveals no gallop and no friction rub.    No murmur heard.  Pulmonary/Chest: Effort normal and breath sounds normal. No respiratory distress. She has no wheezes. She has no rales. She exhibits no tenderness.   Skin: Skin is warm and dry. No rash noted. She is not diaphoretic. No pallor.   Multiple atypical nevi chest, abdomen and back.   Psychiatric: She has a normal mood and affect. Her behavior is normal. Judgment and thought content normal.    Nursing note and vitals reviewed.      ASSESSMENT and PLAN    ICD-10-CM ICD-9-CM    1. 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  The expected course and possible complications were reviewed.  All questions were answered.       Her other chronic conditions are stable at this time.  She is stable on the current treatment and tolerating it well.  No significant sides effects.  Will not adjust therapy at this time, unless noted above.   We will continue to monitor for any problems.  Medications refilled and lab work has been ordered where needed.  New medications are explained including any potential interactions or side effects.  The patient's questions were answered.  She  understands the above and has no further questions.    Further workup and treatment should be done if symptoms persist, worsen or new symptoms occur. The patient will call to notify us of any problems, complications or worsening symptoms.    Follow-up Disposition: Not on File    Patient Instructions     Moles: Care Instructions  Your Care Instructions  Moles are skin growths made up of cells that produce color (pigment). A mole can appear anywhere on the skin, alone or in groups. Most people get a few moles during their first 20 years of life. They are usually brown in color but can be blue, black, or flesh-colored. Most moles are harmless and do not cause pain or other symptoms, unless you rub them or they bump against something.  You usually do not need treatment for moles. But some can turn into cancer. Talk to your doctor if a mole bleeds, itches, burns, or changes size or color. Also let your doctor know if you get a new mole. Make sure to wear sunscreen and other sun protection every day to help prevent skin cancer.  Follow-up care is a key part of your treatment and safety. Be sure to make and go to all appointments, and call your doctor if you are having  problems. It's also  a good idea to know your test results and keep a list of the medicines you take.  How can you care for yourself at home?  ?? Check all the skin on your body once a month for skin growths or other changes, such as in the color and feel of the skin.  ?? Stand in front of a full-length mirror. Look carefully at the front and back of your body. Then look at your right and left sides with your arms raised.  ?? Bend your elbows and look carefully at your forearms, the back of your upper arms, and your palms.  ?? Look at your feet, the bottoms of your feet, and the spaces between your toes.  ?? Use a hand mirror to look at the back of your legs, the back of your neck, and your back, rear end (buttocks), and genital area. Part the hair on your head to look at your scalp.  ?? If you see a change in a skin growth, contact your doctor. Look for:  ?? A mole that bleeds.  ?? A fast-growing mole.  ?? A scaly or crusted growth on the skin.  ?? A sore that will not heal.  To prevent skin cancer  ?? Always wear sunscreen on exposed skin. Make sure to use a broad-spectrum sunscreen that has a sun protection factor (SPF) of 30 or higher. Use it every day, even when it is cloudy.  ?? Wear a wide-brimmed hat and long sleeves and pants if you are going to be outdoors for very long.  ?? Avoid the sun between 10 a.m. and 4 p.m., which is the peak time for the sun's ultraviolet rays.  ?? Avoid sunburns, tanning booths, and sunlamps.  ?? Be sure to protect children from the sun. Sunburns in childhood damage the skin and increase the risk of cancer.  When should you call for help?  Watch closely for changes in your health, and be sure to contact your doctor if:  ?? A mole looks different than it did before. It may have changed in size, color, shape, or the way it looks.  ?? You have a new mole.  ?? You have a new pimple or skin growth that does not go away.  Where can you learn more?  Go to [employer]   Enter M489 in the search box to learn more about "Moles: Care Instructions."  ?? 2006-2016 Healthwise, Incorporated. Care instructions adapted under license by [employer] (which disclaims liability or warranty for this information). This care instruction is for use with your licensed healthcare professional. If you have questions about a medical condition or this instruction, always ask your healthcare professional. [place] any warranty or liability for your use of this information.  Content Version: 11.0.578772; Current as of: August 13, 2014            (Some details in this note may have been created with speech-recognition software.  Some errors in speech recognition may have occurred.   Most of those have been corrected but some may have been missed.)     Raye Sorrow, MD  [facility]  [place] [address]  Phone [phone] ??? [phone]

## 2020-05-01 IMAGING — MR MRI BRAIN WO CONTRAST
14 series · 41 of 48 positions shown · non-contrast
Comparison: none

EXAM: MRI OF THE BRAIN WITHOUT CONTRAST
CLINICAL INDICATION:  Syncope, recurrent
REFERENCE:  MR brain 03/04/2006
TECHNIQUE: Multiplanar multi-sequence MR imaging was performed through the brain without the administration of contrast.  Echoplanar diffusion-weighted imaging sequences were obtained.

[Series 1: survey · sagittal · 1.6mm · 1.62mm/px · 8 of 128 slices shown]
[im 1/128]
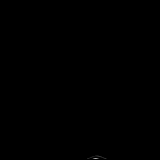
[im 22/128]
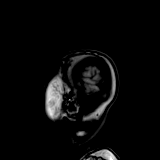
[im 43/128]
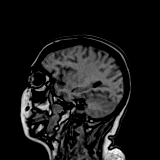
[im 53/128]
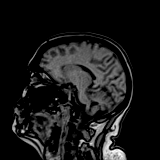
[im 75/128]
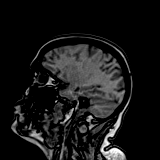
[im 85/128]
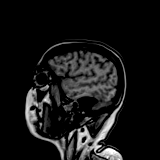
[im 106/128]
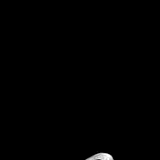
[im 128/128]
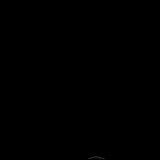

[Series 2: survey_mpr_sag · sagittal · 1.6mm · 1.60mm/px · 1 of 5 slices shown]
[im 1/5]
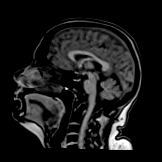

[Series 3: survey_mpr_cor · coronal · 1.6mm · 1.60mm/px · 1 of 3 slices shown]
[im 1/3]
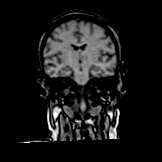

[Series 4: survey_mpr_tra · axial · 1.6mm · 1.60mm/px · 1 of 3 slices shown]
[im 1/3]
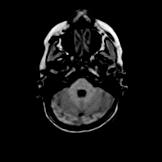

[Series 5: T1 · sagittal · 5.0mm · 0.72mm/px · 2 of 24 slices shown (1 of 2)]
[im 1/24]
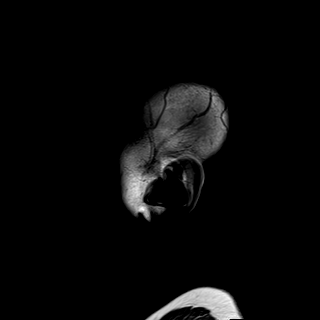
[im 24/24]
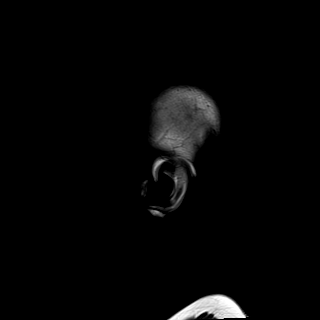

[Series 6: ax dwi_tracew · axial · 4.0mm · 0.60mm/px · z∈[-32,+100]mm · 3 of 30 slices shown (1 of 3)]
[im 1/30]
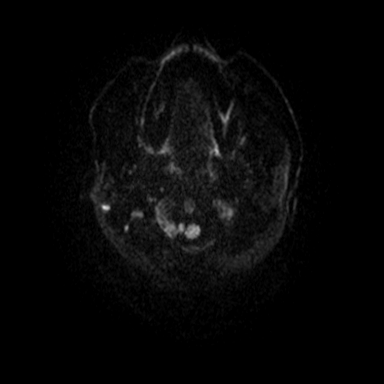
[im 15/30]
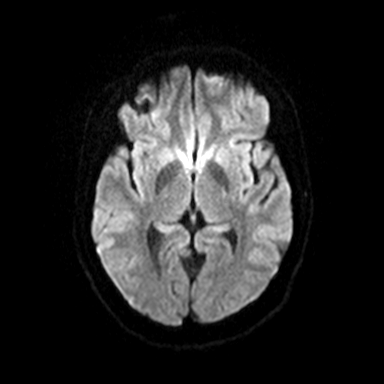
[im 30/30]
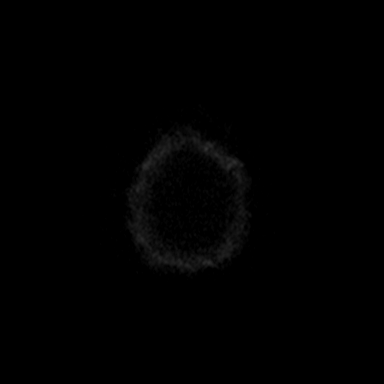

[Series 6: ax dwi_tracew · axial · 4.0mm · 0.60mm/px · z∈[-32,+100]mm · 3 of 30 slices shown (2 of 3)]
[im 1/30]
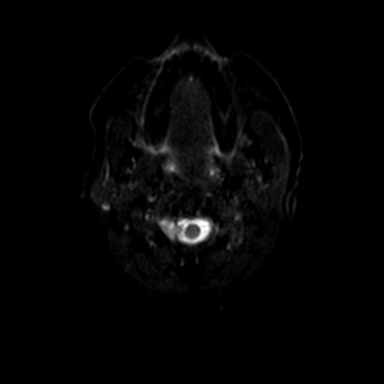
[im 15/30]
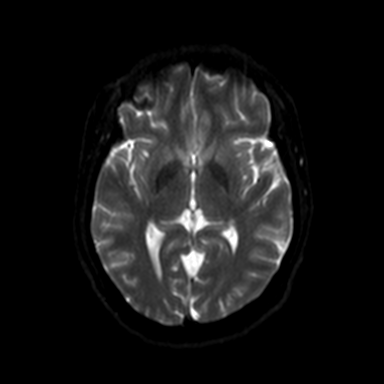
[im 30/30]
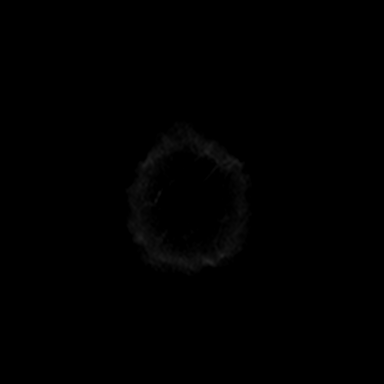

[Series 6: ax dwi_tracew · axial · 4.0mm · 0.60mm/px · z∈[-32,+100]mm · 6 of 60 slices shown (3 of 3)]
[im 1/60]
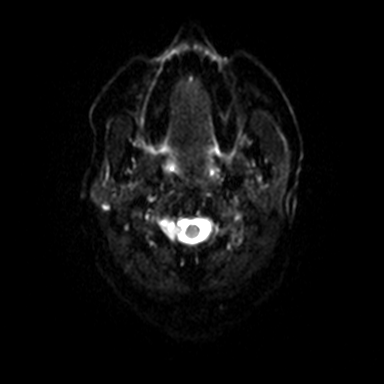
[im 12/60]
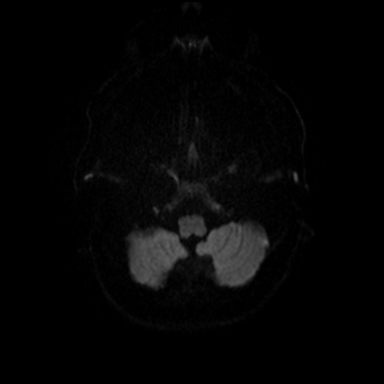
[im 24/60]
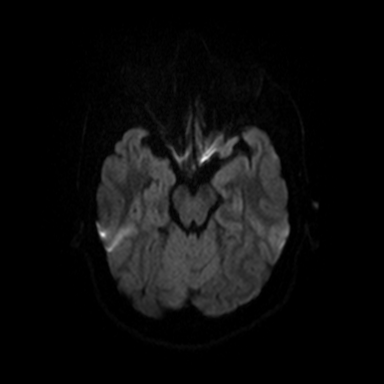
[im 36/60]
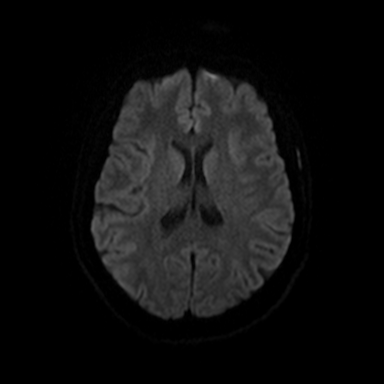
[im 48/60]
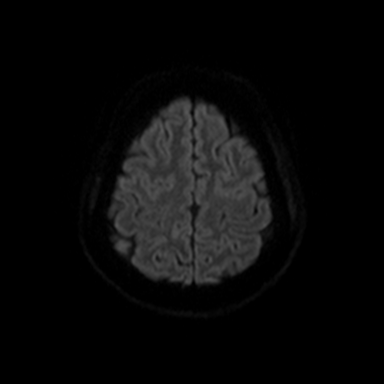
[im 60/60]
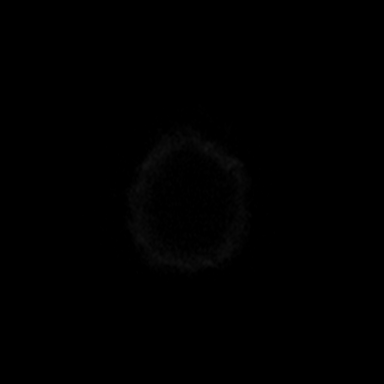

[Series 7: ax dwi_adc · axial · 4.0mm · 0.60mm/px · z∈[-32,+100]mm · 3 of 30 slices shown]
[im 1/30]
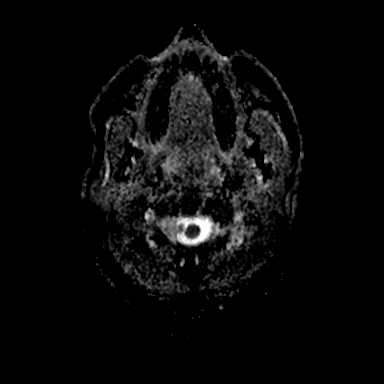
[im 15/30]
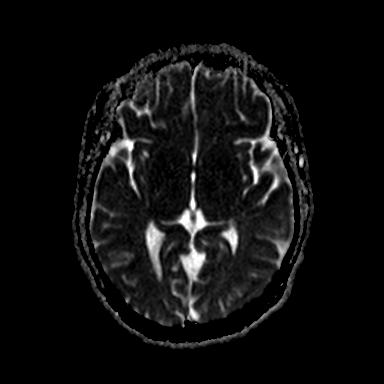
[im 30/30]
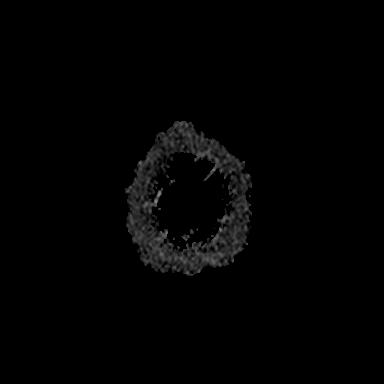

[Series 8: ax dwi_exp · axial · 4.0mm · 0.60mm/px · 1 of 30 slices shown]
[im 1/30]
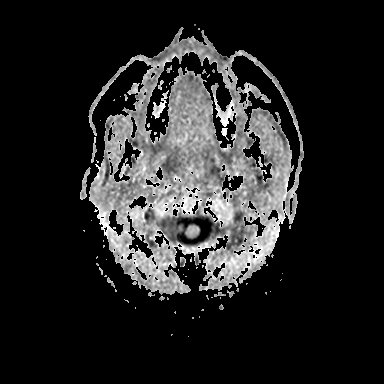

[Series 9: T2 · axial · 4.0mm · 0.45mm/px · z∈[-32,+101]mm · 3 of 30 slices shown]
[im 1/30]
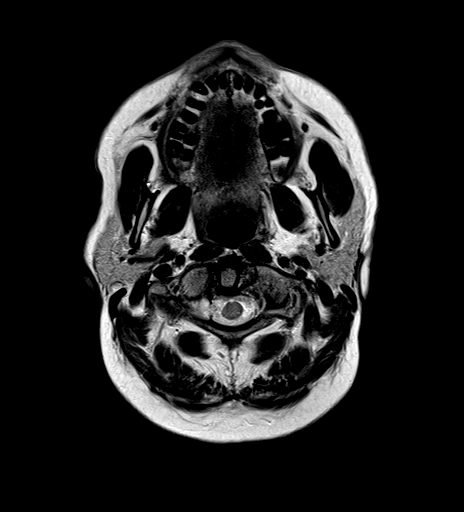
[im 15/30]
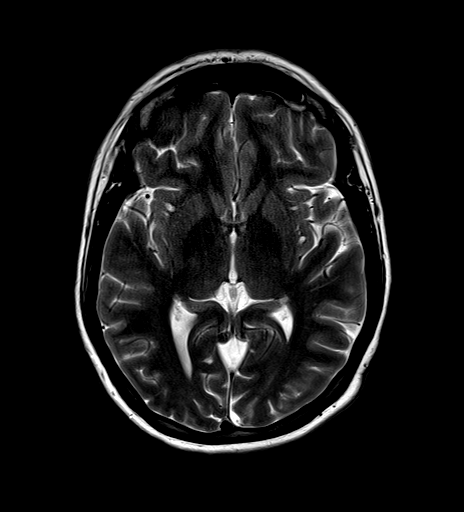
[im 30/30]
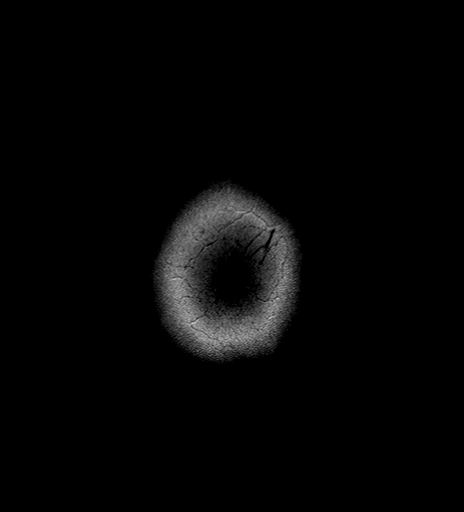

[Series 10: T1 · axial · 4.0mm · 0.72mm/px · z∈[-32,+100]mm · 3 of 30 slices shown (2 of 2)]
[im 1/30]
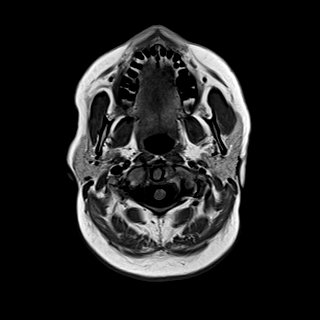
[im 15/30]
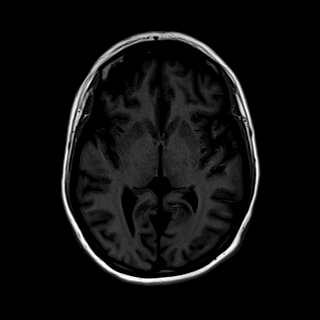
[im 30/30]
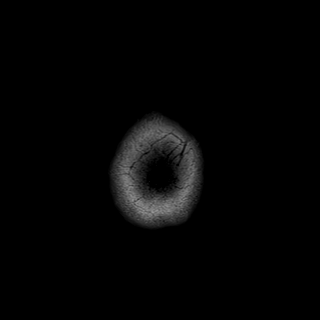

[Series 11: FLAIR · axial · 4.0mm · 0.72mm/px · z∈[-32,+101]mm · 3 of 30 slices shown]
[im 1/30]
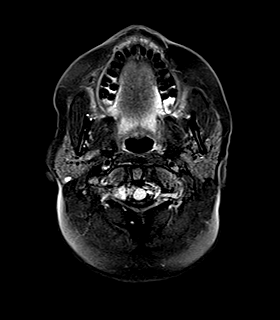
[im 15/30]
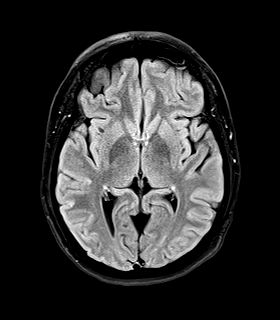
[im 30/30]
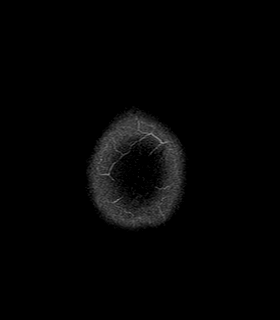

[Series 12: GRE · axial · 4.0mm · 0.45mm/px · z∈[-32,+100]mm · 3 of 30 slices shown]
[im 1/30]
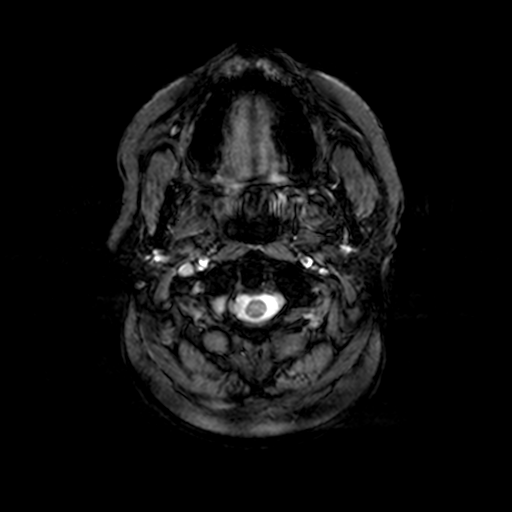
[im 15/30]
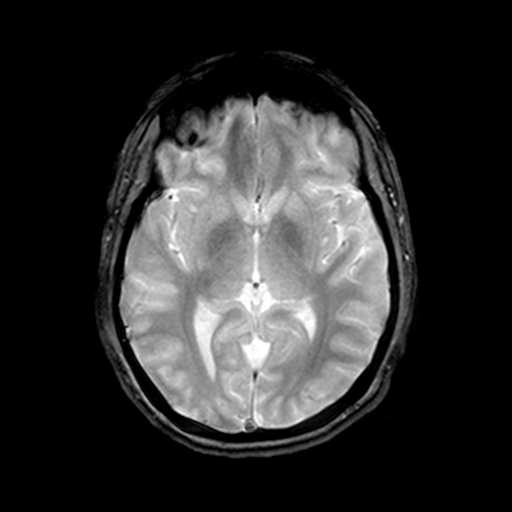
[im 30/30]
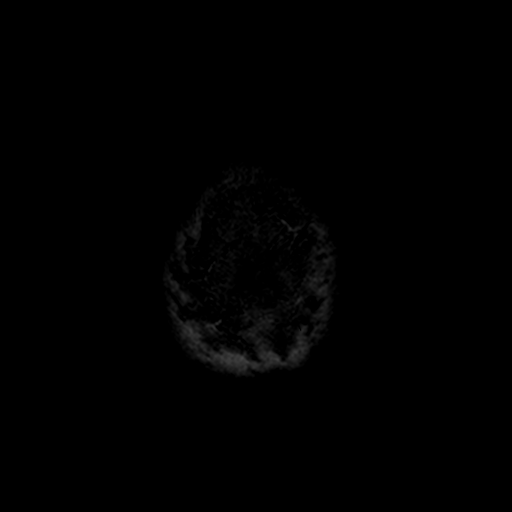

[41 of 48 positions shown; findings below may reference images not displayed]

FINDINGS: Intracranially, no hemorrhage or mass-effect is demonstrated.  The ventricles are normal in size and midline in position.  Normal signal intensity gray and white matter is present symmetrically bilaterally.  No abnormal focal T1, T2, or flair abnormalities are present.
Vascular structures demonstrate grossly normal flow voids diffusely.  The cerebellar pontine angles image symmetrically and normally bilaterally.
Extracranially, the sinuses and mastoids are normally aerated no abnormal signal intensity demonstrated.  Limited imaging of the orbits, calvarium, and surrounding scalp demonstrate no abnormalities.
IMPRESSION: 1.  Unremarkable examination with no acute or chronic intracranial abnormalities as above.
LOCATION CODE: 1
Is the patient pregnant?
Unknown

## 2020-05-31 IMAGING — CT CT HEAD WO CONTRAST
3 of 5 series · 17 of 47 positions shown, 20 images · non-contrast
Comparison: none

Head CT without contrast
Unenhanced images of the head are obtained and are evaluated in soft tissue and bone windows
Location number is 1
Ventricles are  normal in size and configuration.
Cortical gray right differentiation  is maintained.  There is No diffuse cortical volume loss.
Deep white matter  is normal in attenuation
There is not any intracranial mass effect.
There  is no abnormal extra axial fluid collection.
There  is no intracranial hemorrhage.
Bones are intact.
Visualized paranasal sinuses are clear.
Middle air cavities and mastoid air cells are clear.

[Series 2: brain without · axial · non-contrast · 0.49mm/px · z∈[+128,+248]mm · 11 of 30 slices shown, 14 images]
[im 3/30  brain]
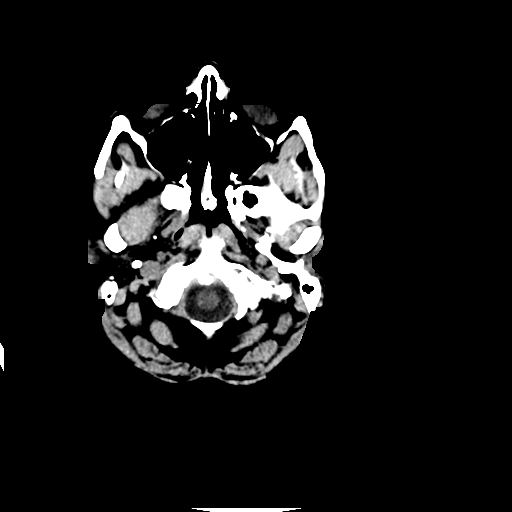
[im 3/30  bone]
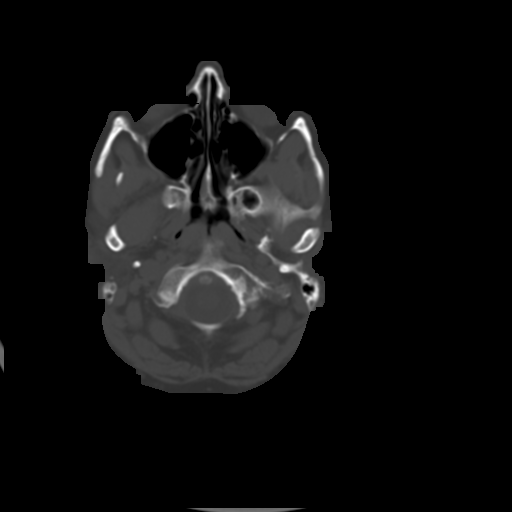
[im 5/30  brain]
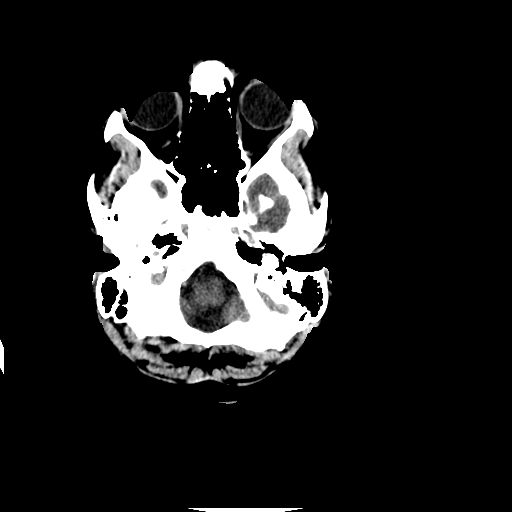
[im 7/30  brain]
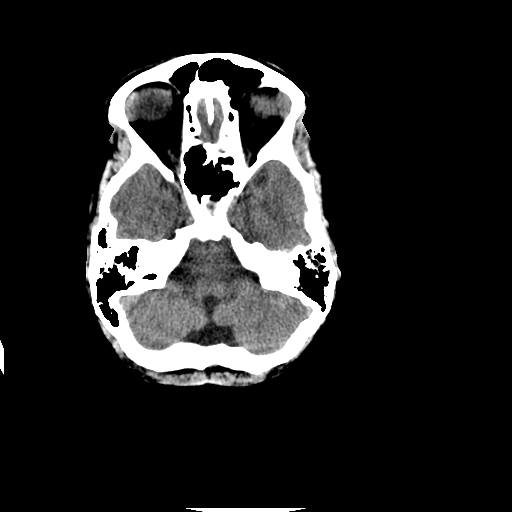
[im 9/30  brain]
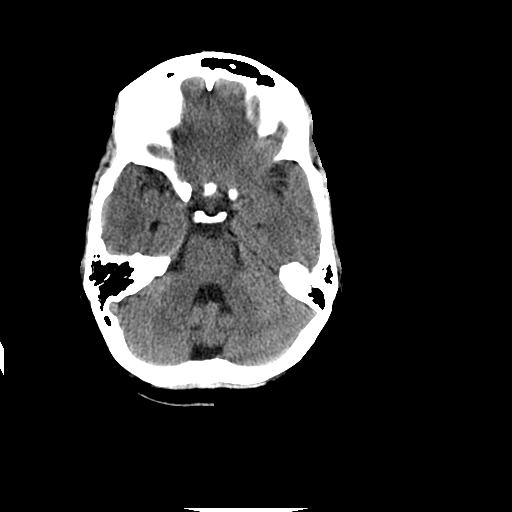
[im 12/30  brain]
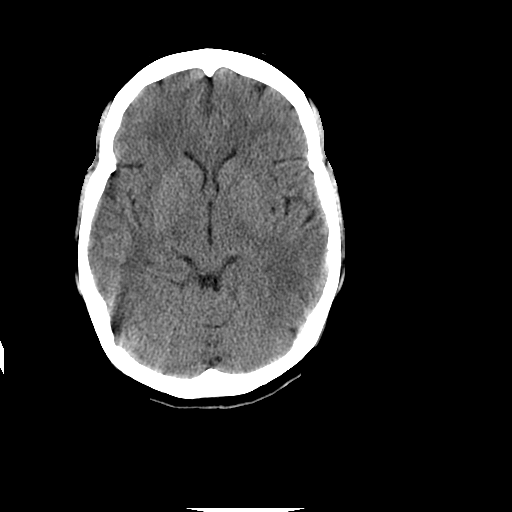
[im 12/30  bone]
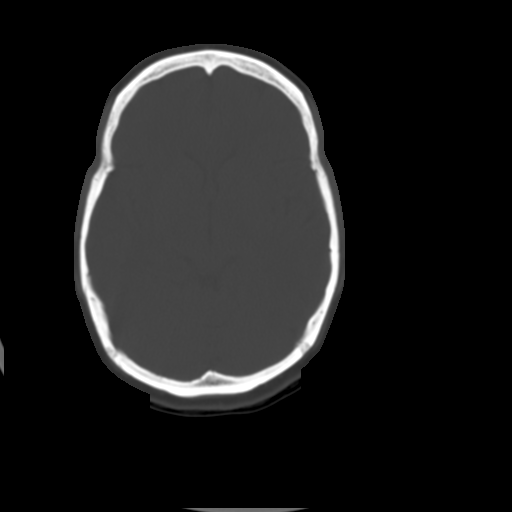
[im 16/30  brain]
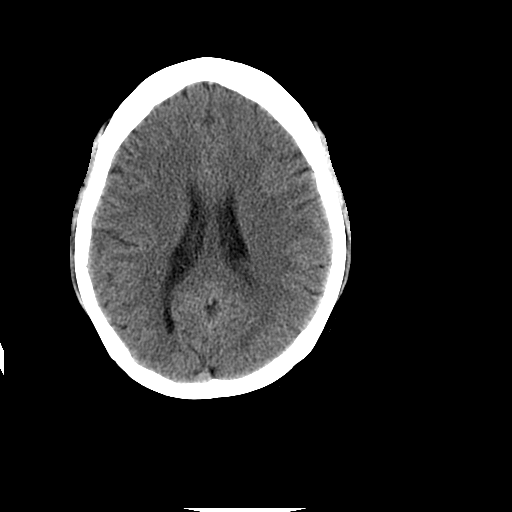
[im 18/30  brain]
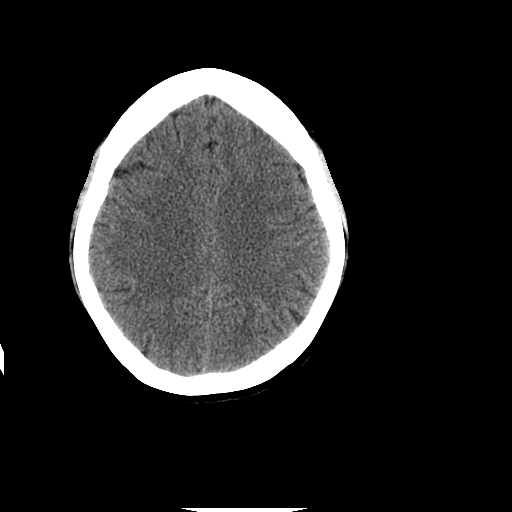
[im 21/30  brain]
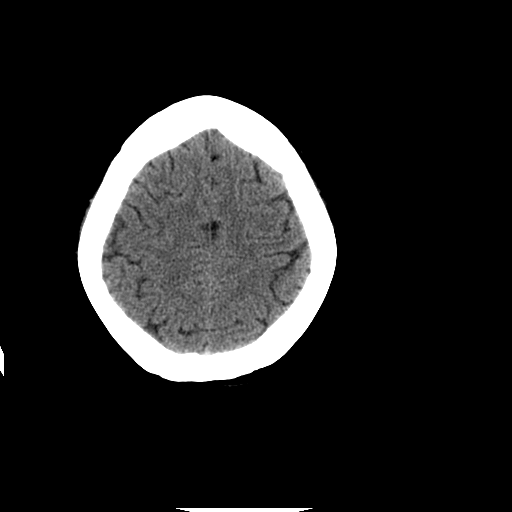
[im 23/30  brain]
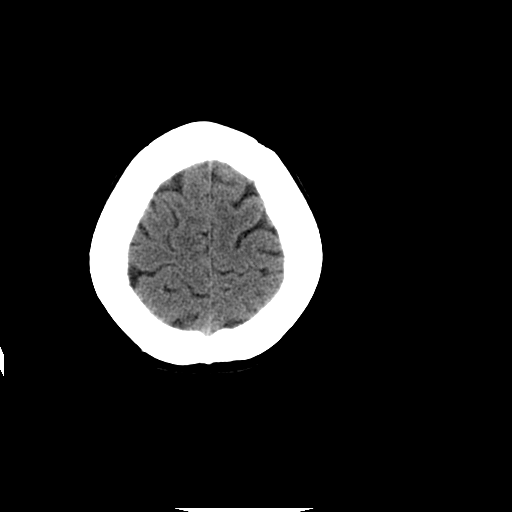
[im 23/30  bone]
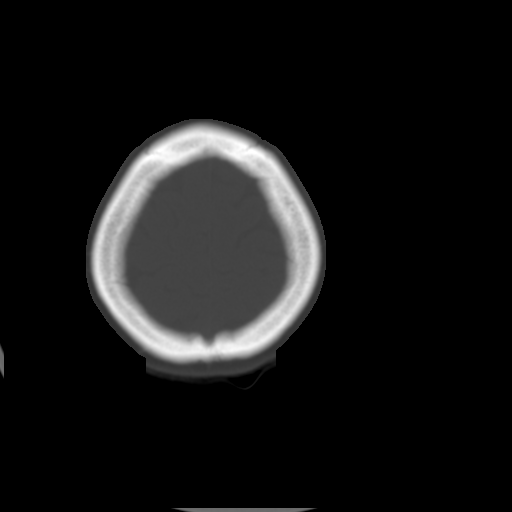
[im 25/30  brain]
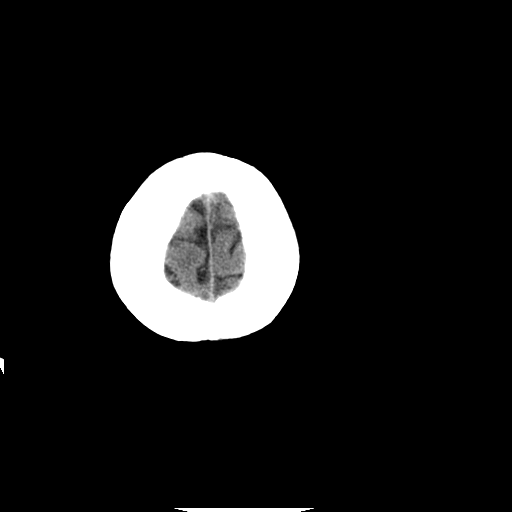
[im 27/30  brain]
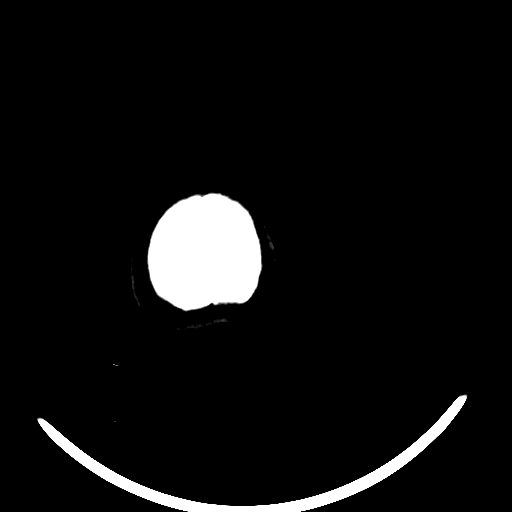

[mpr, thins, sagittal · sagittal · 0.49mm/px · 3 of 80 slices shown]
[im 27/80  brain]
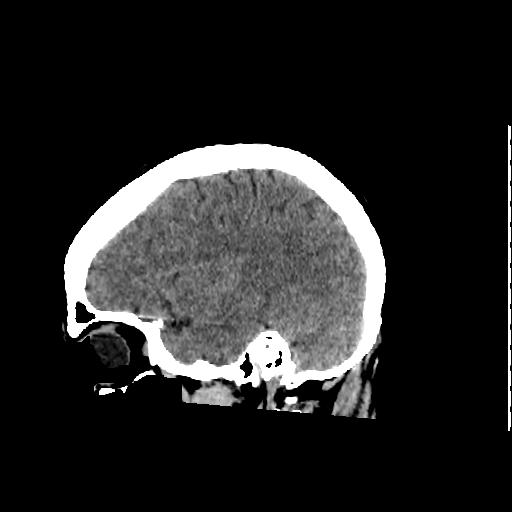
[im 40/80  brain]
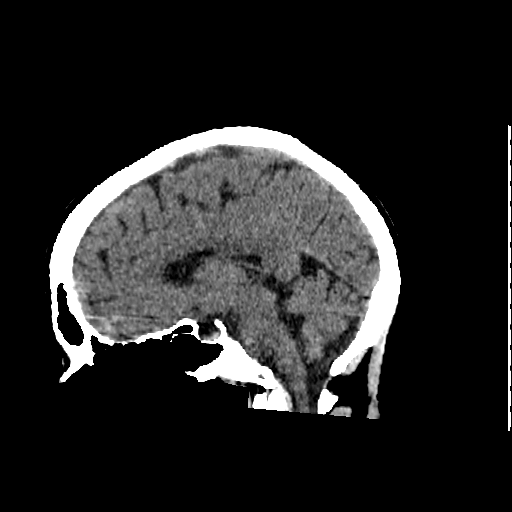
[im 53/80  brain]
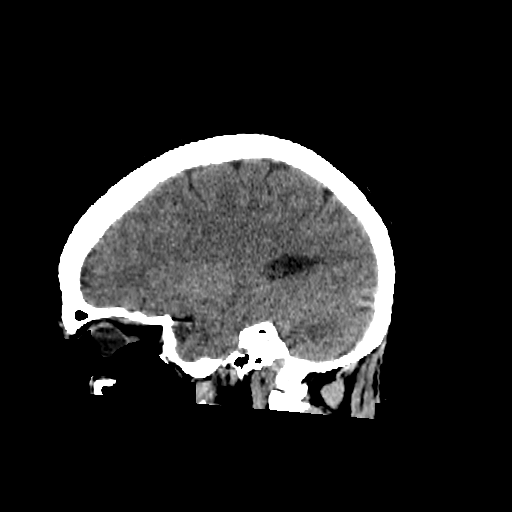

[mpr, thins, coronal · coronal · 0.49mm/px · 3 of 88 slices shown]
[im 30/88  brain]
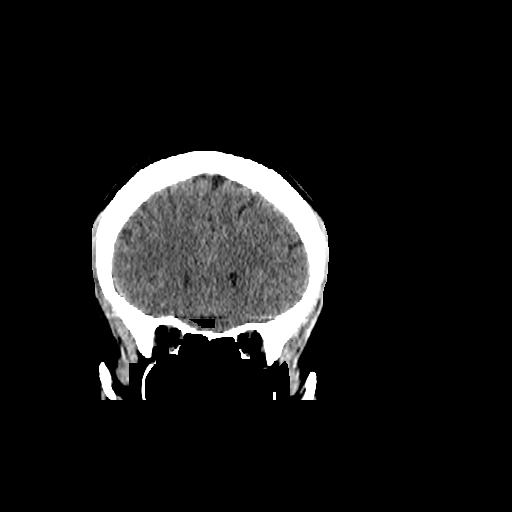
[im 39/88  brain]
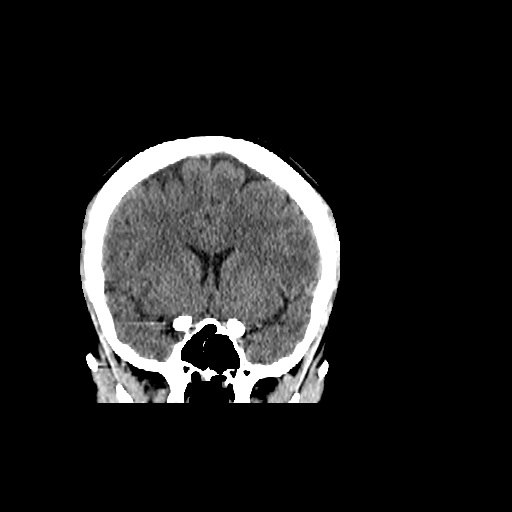
[im 49/88  brain]
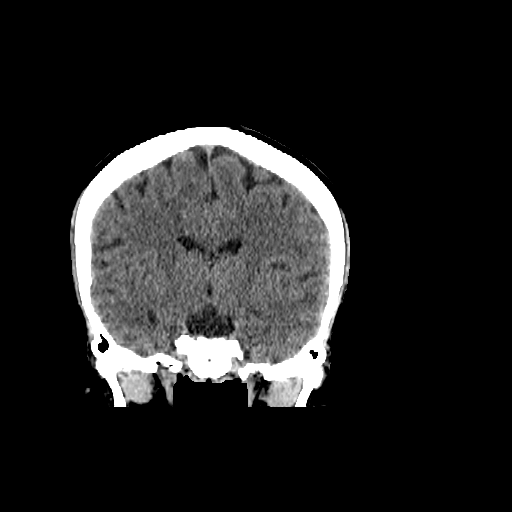

[17 of 47 positions shown; findings below may reference images not displayed]

IMPRESSION: 
IMPRESSION: Negative head CT exam.
.
Is the patient pregnant?
No

## 2020-07-22 IMAGING — CR XR CHEST 1 VIEW
1 series · 1 of 1 positions shown · non-contrast
Comparison: 05/10/2020
VIEWS: 1

EXAM: SINGLE VIEW CHEST
INDICATION: 34-year-old female with chest pain

[AP]
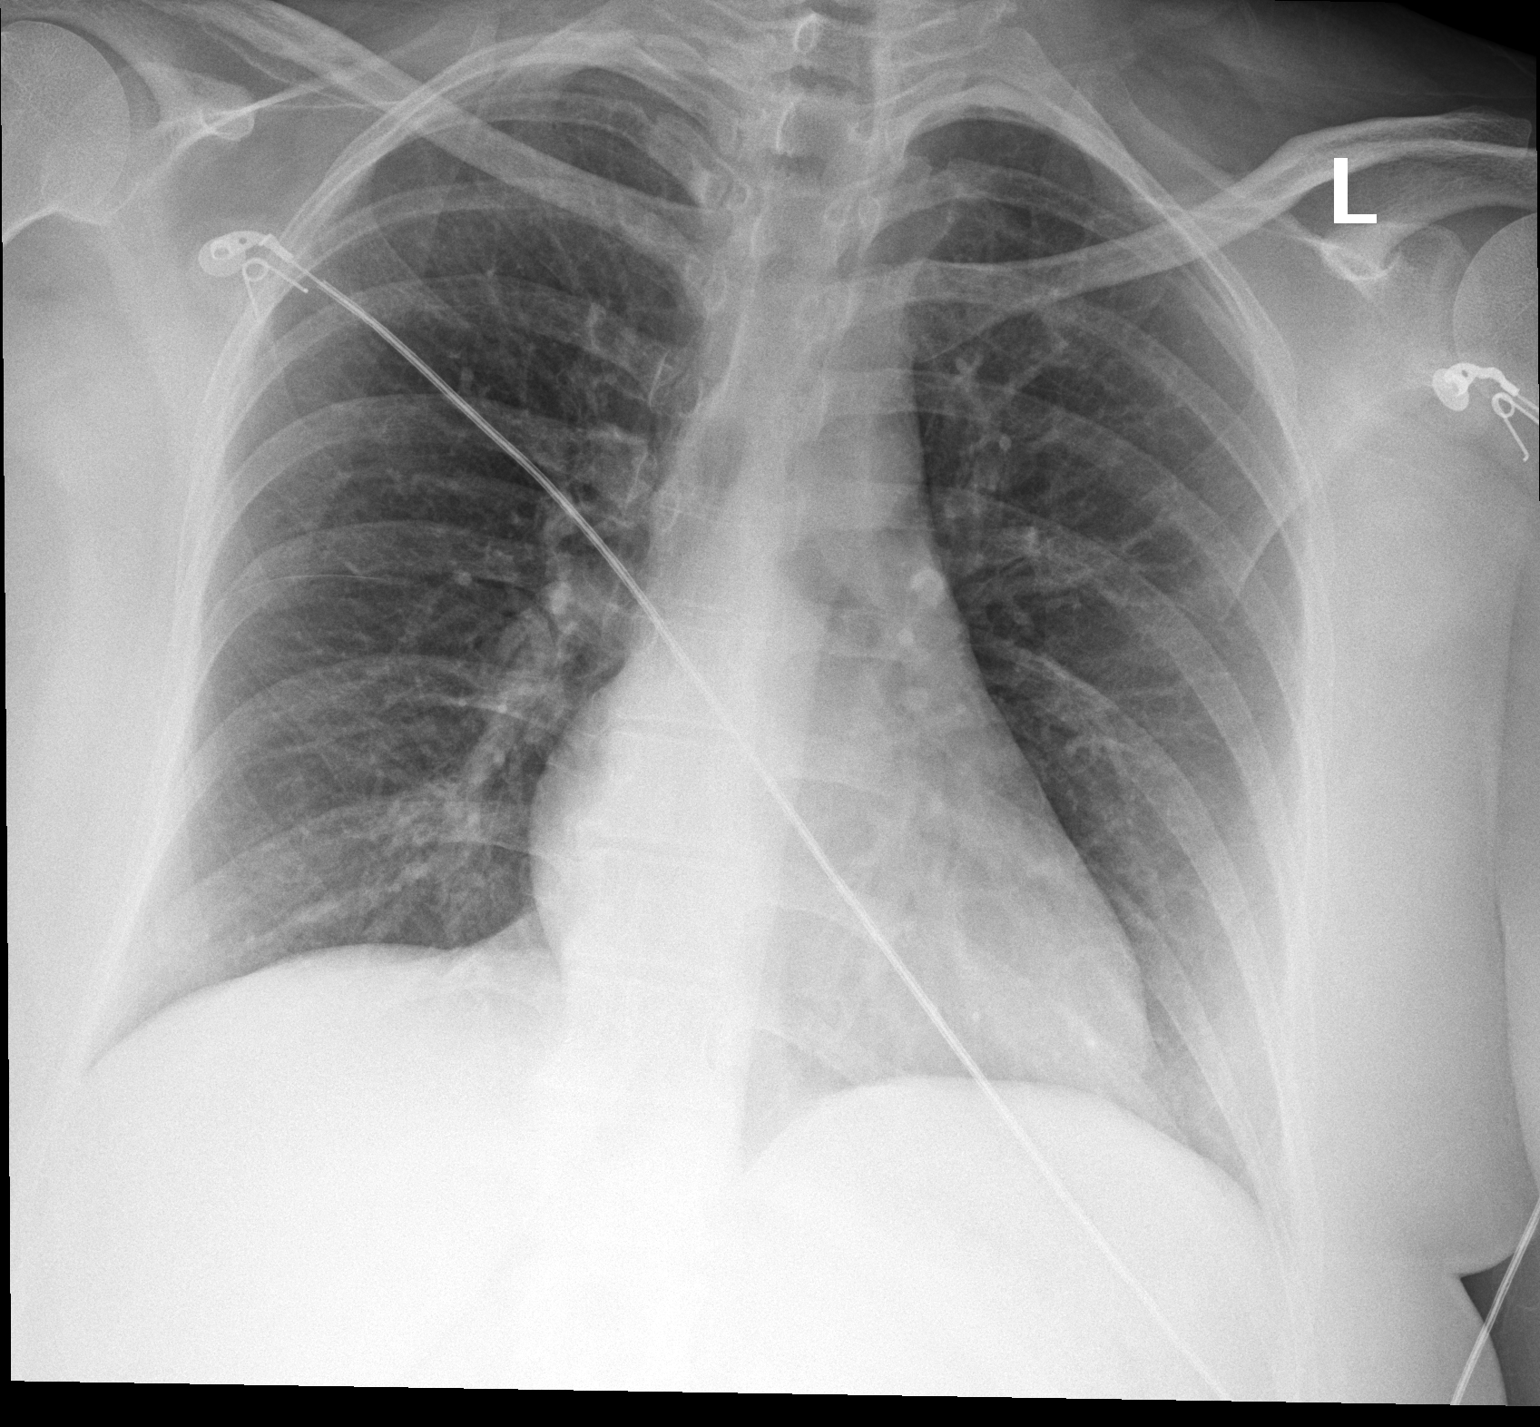

[1 of 1 positions shown; findings below may reference images not displayed]

FINDINGS: LUNGS/PLEURA: Clear. No effusions.
HEART/MEDIASTINUM: No cardiomegaly. No mediastinal or hilar mass.
LIFE SUPPORT LINES: None.
PNEUMOTHORAX: None.
OSSEOUS STRUCTURES: No acute fracture or destructive lesion.
IMPRESSION: No evidence of acute cardiopulmonary process.
LOCATION CODE: 13
Is the patient pregnant?
Unknown

## 2020-08-11 IMAGING — CR XR CHEST 1 VIEW
1 series · 1 of 1 positions shown · non-contrast
Comparison: none

SINGLE PORTABLE CHEST:
CLINICAL INDICATION: cp
REFERENCE: 07/22/2020

[AP]
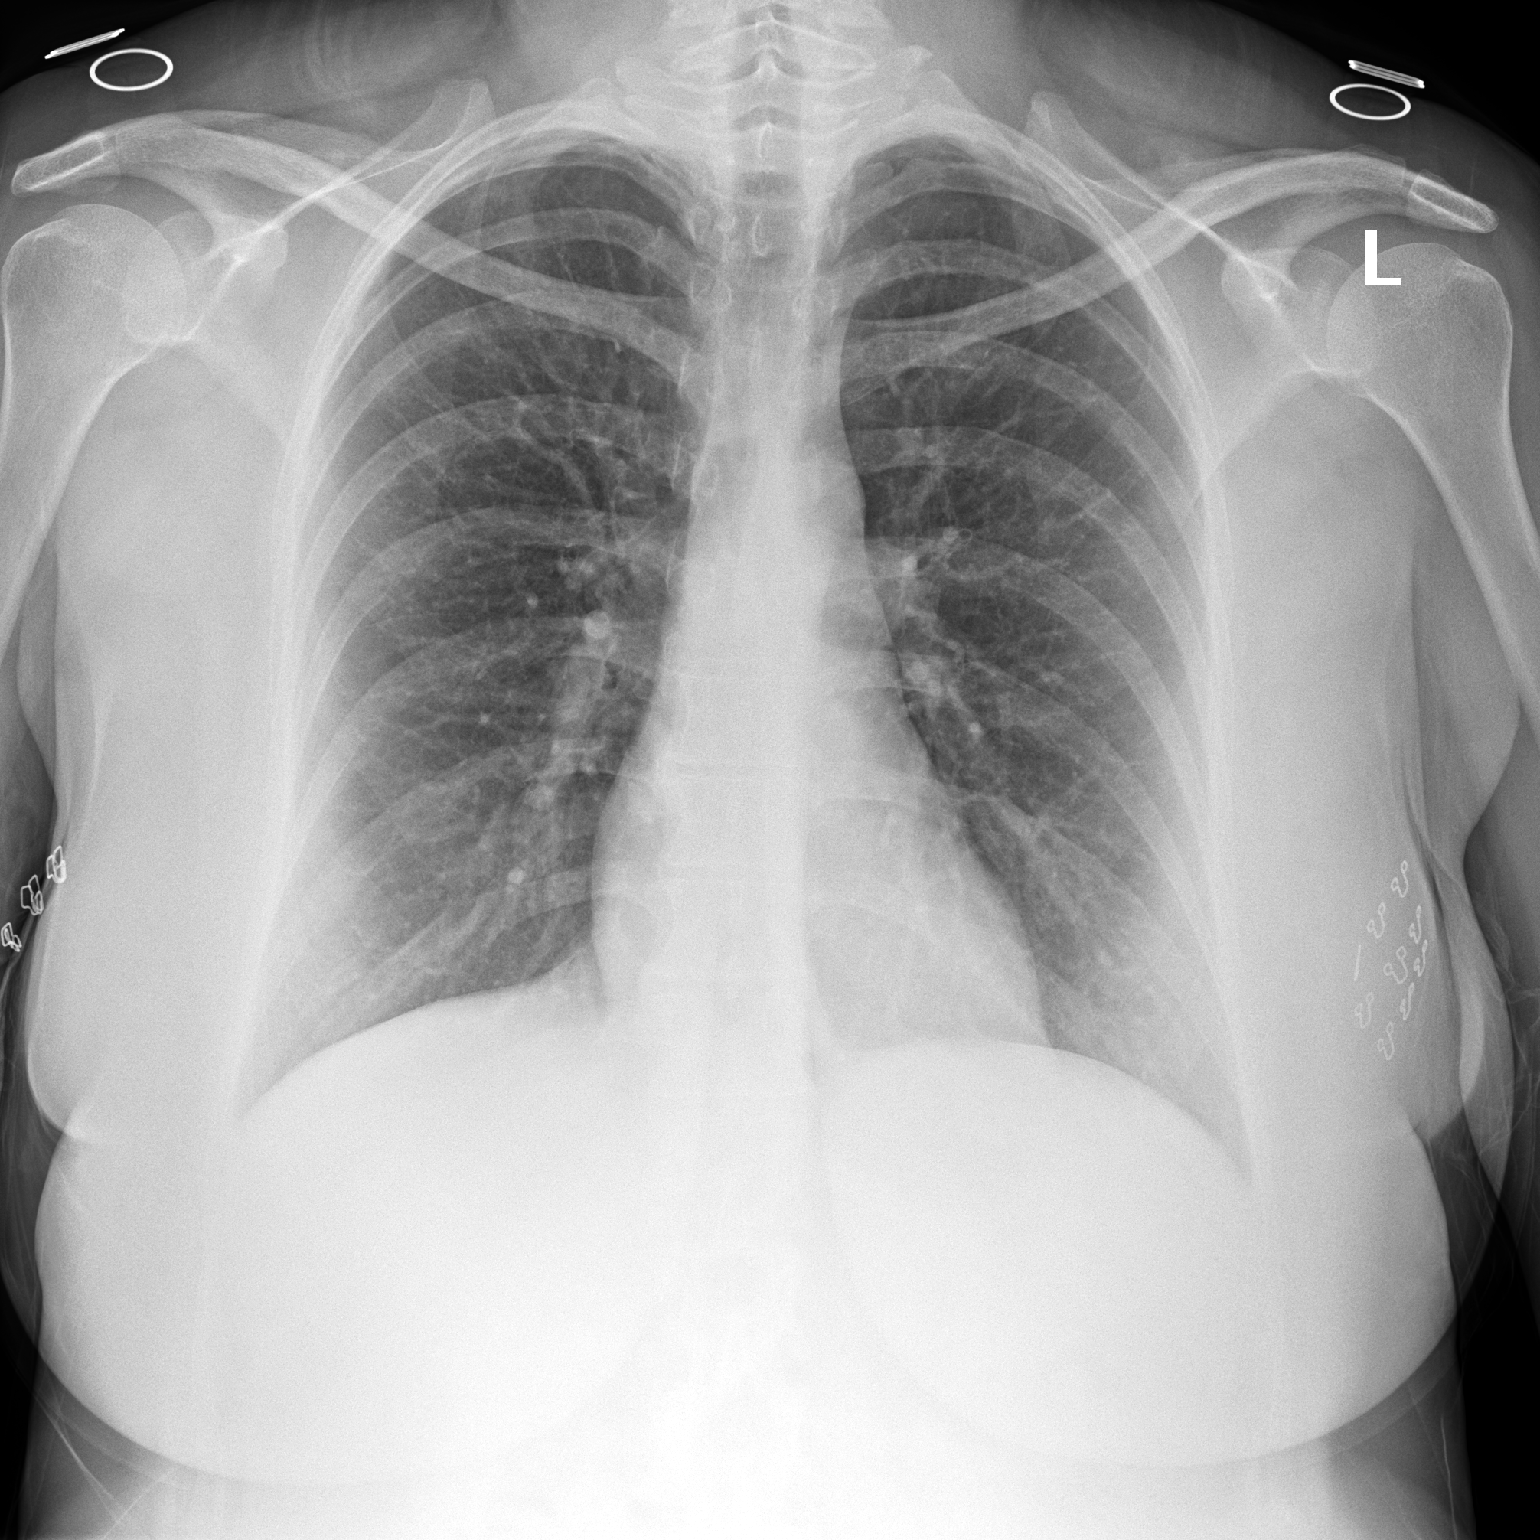

[1 of 1 positions shown; findings below may reference images not displayed]

FINDINGS: Single radiograph of the chest demonstrates normal cardiomediastinal contours.
The lungs demonstrate no mass, infiltrate, or effusion.
Surrounding osseous and soft tissue structures are unremarkable.
IMPRESSION: No acute cardiopulmonary process identified.
LOCATION CODE: 1
Is the patient pregnant?
Unknown

## 2020-08-13 IMAGING — CR XR CHEST 1 VIEW
1 series · 1 of 1 positions shown · non-contrast
Comparison: none

FRONTAL  CHEST.
INDICATION: chest pain

[AP]
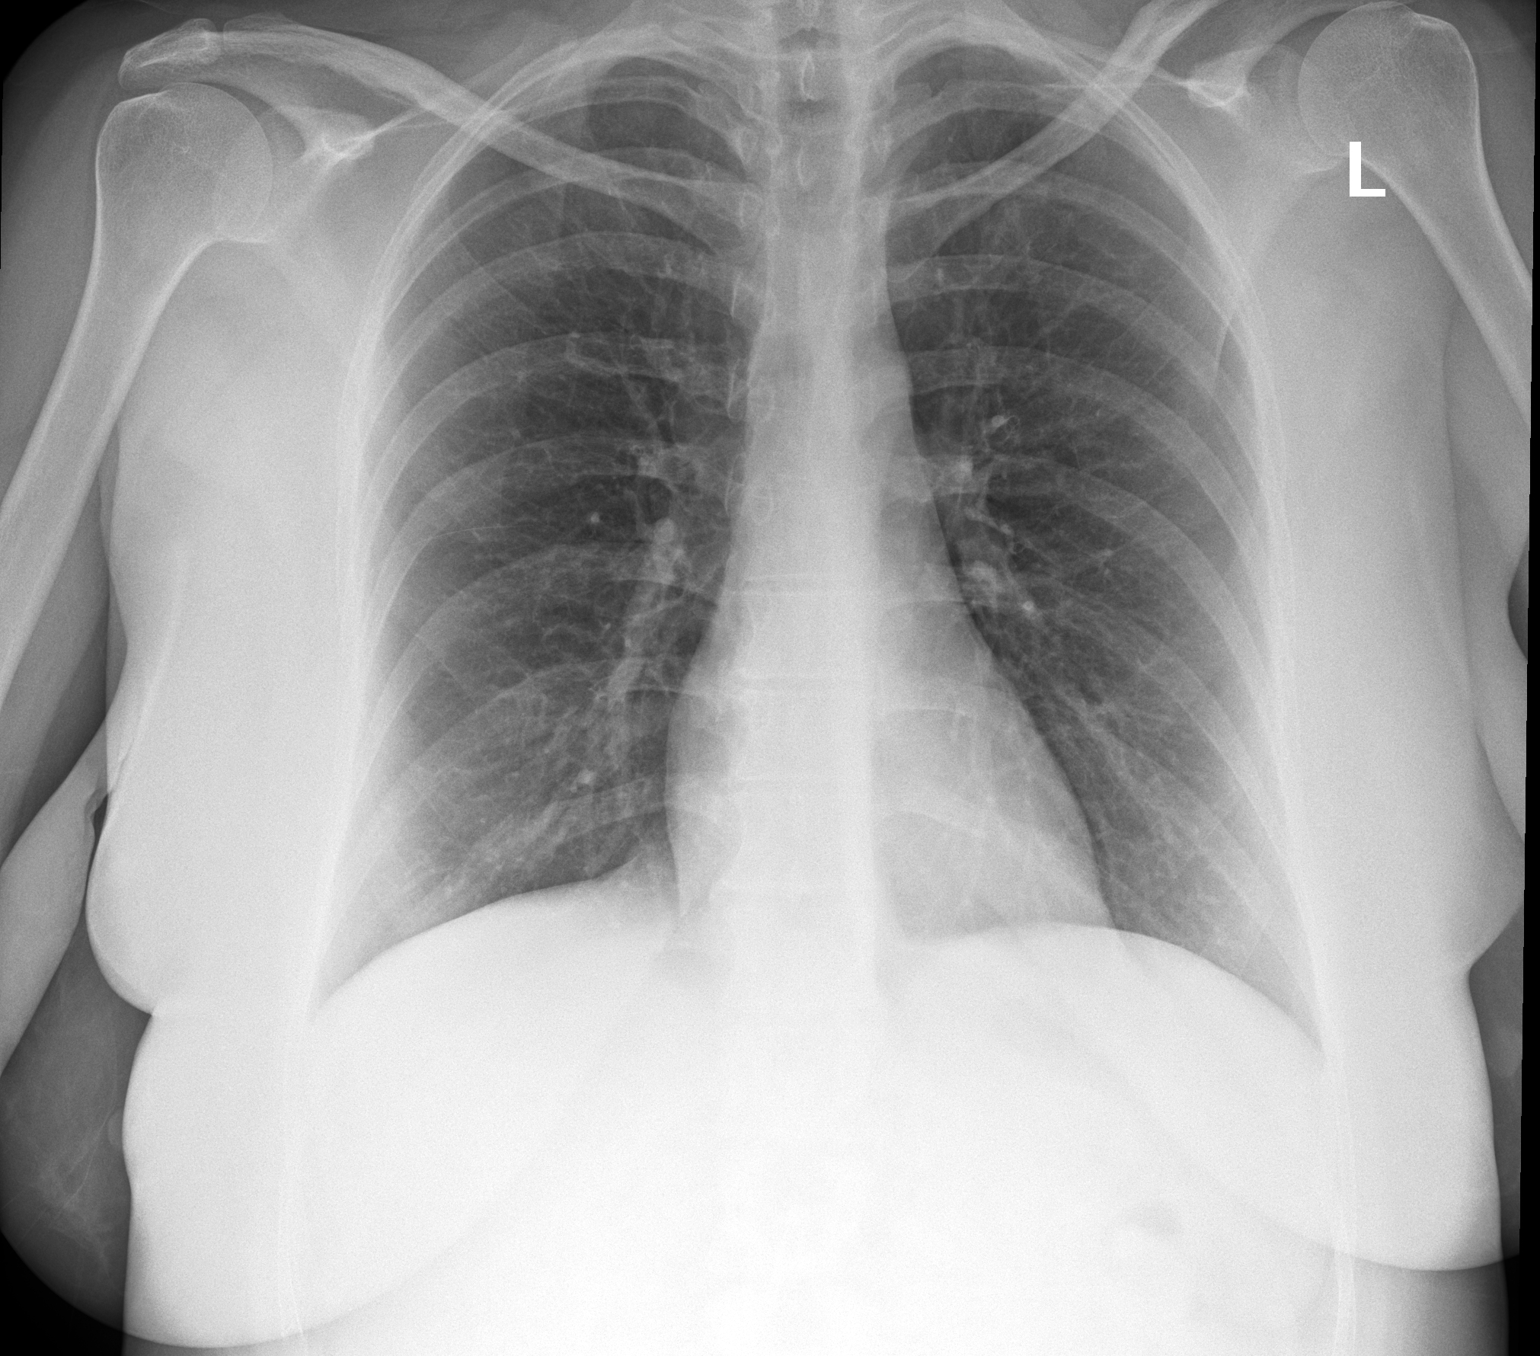

[1 of 1 positions shown; findings below may reference images not displayed]

FINDINGS: Frontal chest radiograph.  Normal heart size and mediastinal contours.  Lungs and pleural spaces are clear.  No pneumothorax.  The osseous structures are grossly intact.
IMPRESSION: No acute cardiopulmonary process
Location 9
Is the patient pregnant?
Unknown

## 2021-03-01 IMAGING — US BREAST ULTRASOUND LEFT LIMITED
1 series · 2 of 2 positions shown · non-contrast
Comparison: None.

This is a summary report. The complete report is available in the patient's medical record. If you cannot access the medical record, please contact the sending organization for a detailed fax or copy.
FINAL REPORT:
EXAM: MAMMO BREAST DIAGNOSTIC BILATERAL, BREAST ULTRASOUND LEFT LIMITED, BREAST ULTRASOUND RIGHT LIMITED
CLINICAL HISTORY: 34-year-old female with bilateral breast lumps.
TECHNIQUE: MLO and CC views with tomosynthesis of the breasts were obtained on a digital full-field mammographic unit. Additional true lateral and spot compression MLO/CC views of the breasts were also performed. Nipple and skin markers were placed as needed. This examination was interpreted in conjunction with computer aided detection system. High-resolution real-time targeted bilateral breast ultrasound was also performed in the region(s) of interest.

[Series 1: breast ultrasound left limited · 2 of 2 slices shown]
[im 1/2]
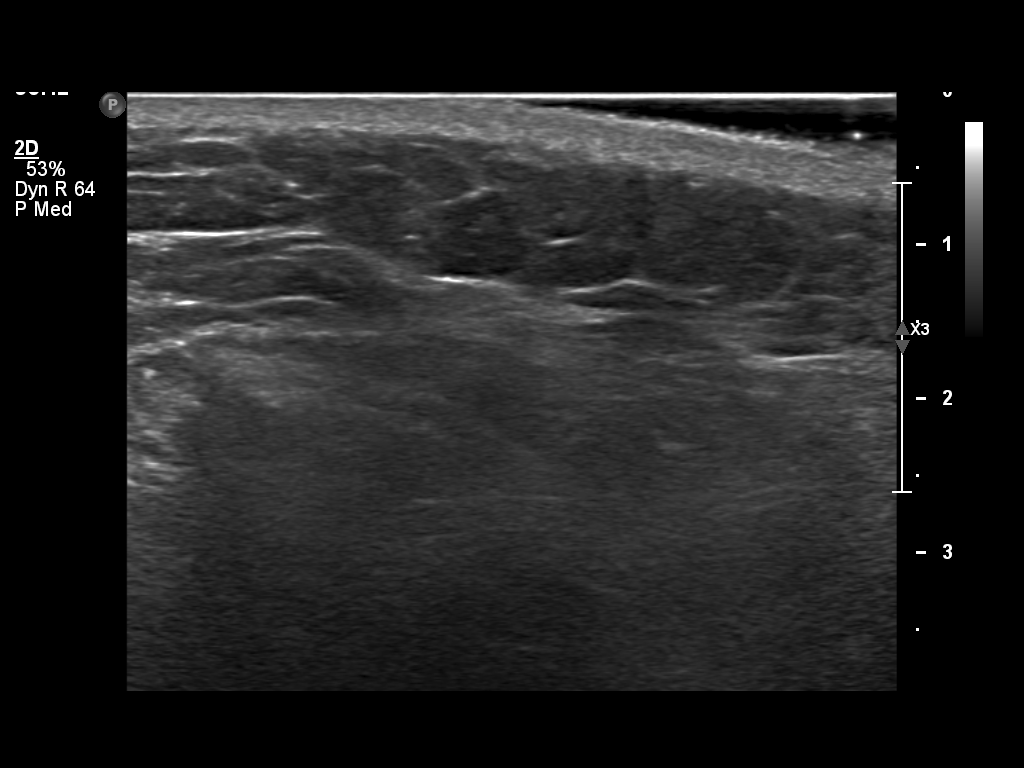
[im 2/2]
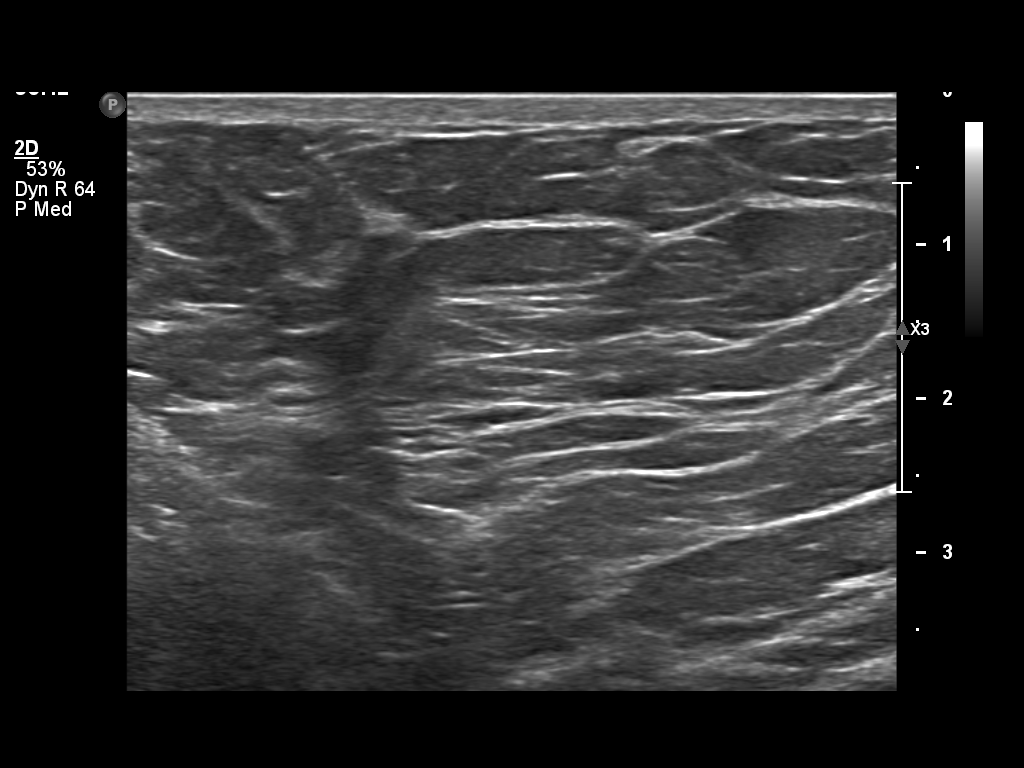

[2 of 2 positions shown; findings below may reference images not displayed]

FINDINGS: BILATERAL MAMMOGRAM:
BREAST DENSITY TYPE B: There are scattered areas of fibroglandular density.
There is a biopsy clip in the lower inner left breast. No findings to correspond with the sites of palpable concern in the breasts. There is no evidence of a dominant mass, suspicious cluster of calcifications, or architectural distortion in either breast. The skin, nipples, and both axillae are unremarkable.
LIMITED BILATERAL BREAST ULTRASOUND:
At the sites of [DATE] left breast 8 cm from the nipple, [DATE] right breast 4 cm from the nipple, [DATE] right breast 6 cm from the nipple, and [DATE] right breast 6 cm from the nipple, no underlying mass or cyst is visualized. There are no suspicious sonographic findings.
IMPRESSION: 1.  No mammographic or sonographic evidence of malignancy.
2.  No mammographic or sonographic findings to correspond with any of the sites of palpable concern in the breasts as described above.
While there is no mammographic or sonographic evidence of malignant disease, any clinically palpable abnormality should be managed on clinical grounds and independently from a negative report.
BI-RADS Category 1: Negative
Recommendation(s):
1. Bilateral screening mammogram in one year.
2. Clinical follow-up for patient's bilateral breast lumps.
The patient information will be entered into a reminder system with a target due date for the next mammogram.

## 2021-03-01 IMAGING — US BREAST ULTRASOUND RIGHT LIMITED
1 series · 6 of 6 positions shown · non-contrast
Comparison: None.

This is a summary report. The complete report is available in the patient's medical record. If you cannot access the medical record, please contact the sending organization for a detailed fax or copy.
FINAL REPORT:
EXAM: MAMMO BREAST DIAGNOSTIC BILATERAL, BREAST ULTRASOUND LEFT LIMITED, BREAST ULTRASOUND RIGHT LIMITED
CLINICAL HISTORY: 34-year-old female with bilateral breast lumps.
TECHNIQUE: MLO and CC views with tomosynthesis of the breasts were obtained on a digital full-field mammographic unit. Additional true lateral and spot compression MLO/CC views of the breasts were also performed. Nipple and skin markers were placed as needed. This examination was interpreted in conjunction with computer aided detection system. High-resolution real-time targeted bilateral breast ultrasound was also performed in the region(s) of interest.

[Series 1: breast ultrasound right limited · 6 of 6 slices shown]
[im 1/6]
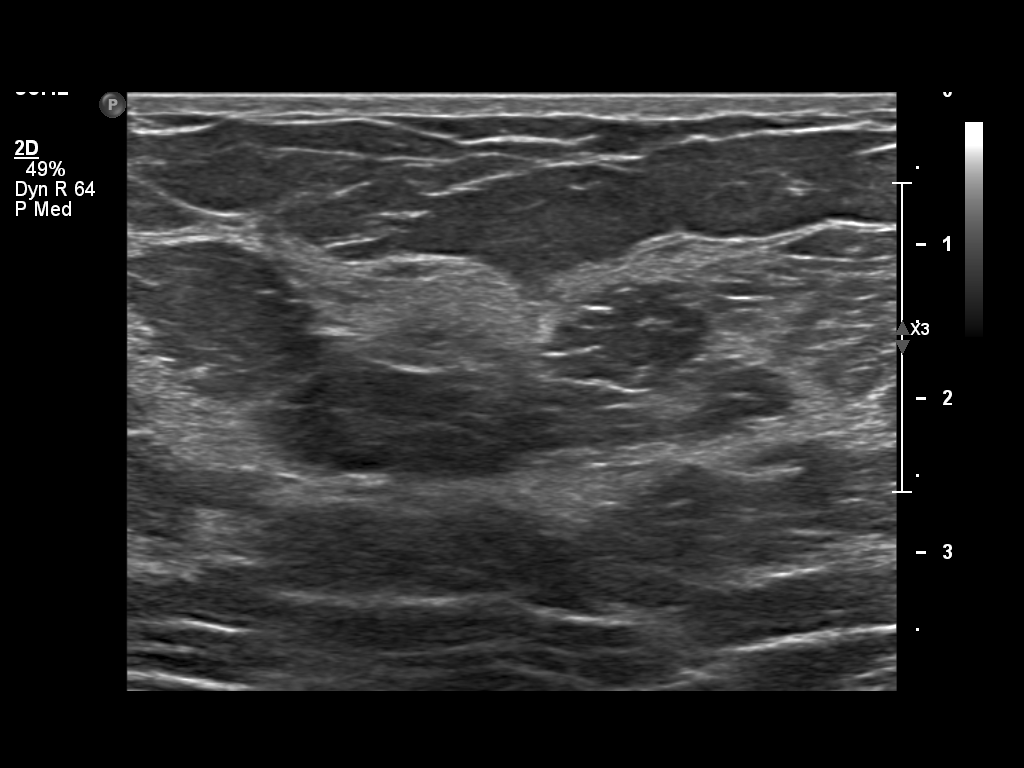
[im 2/6]
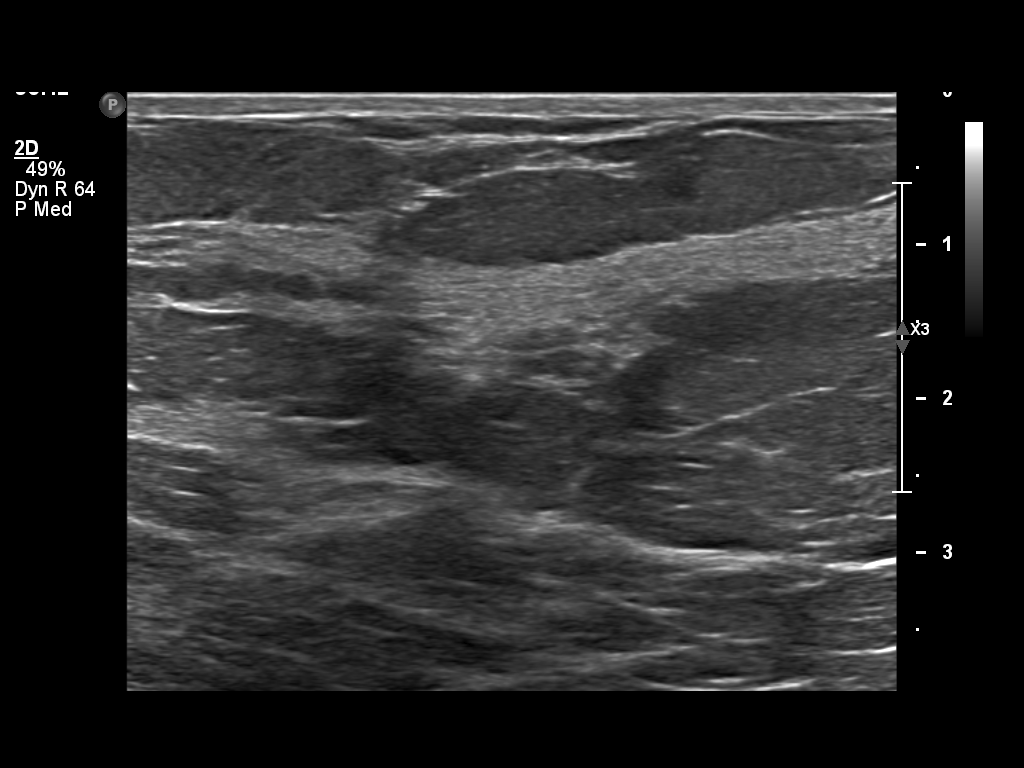
[im 3/6]
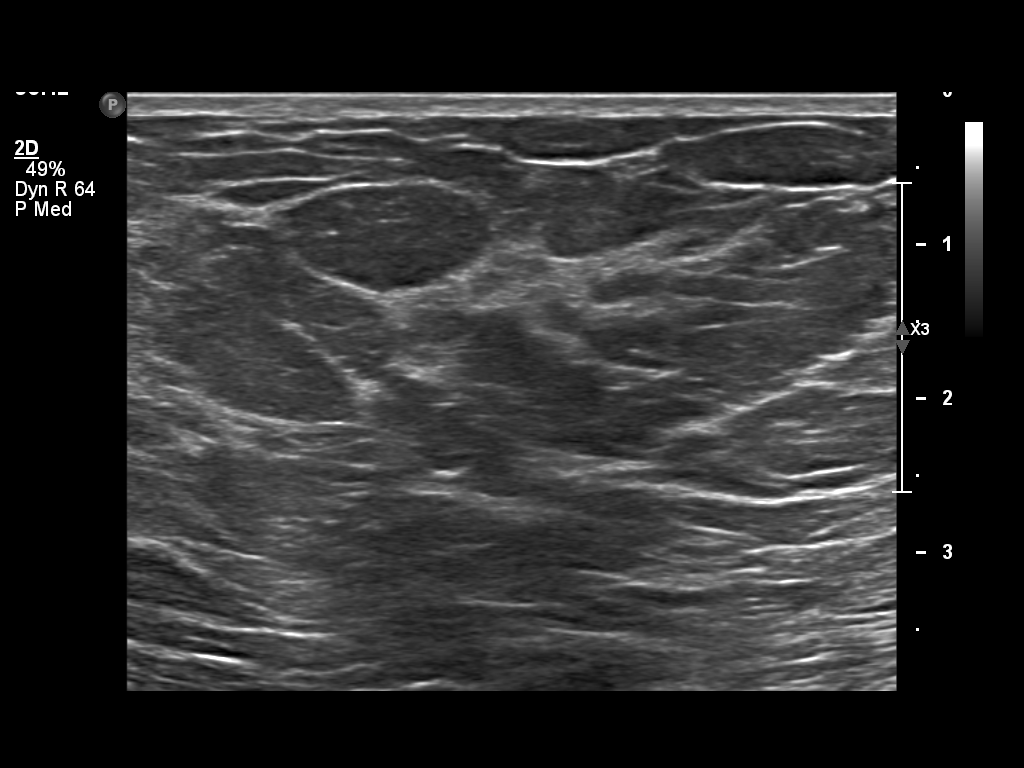
[im 4/6]
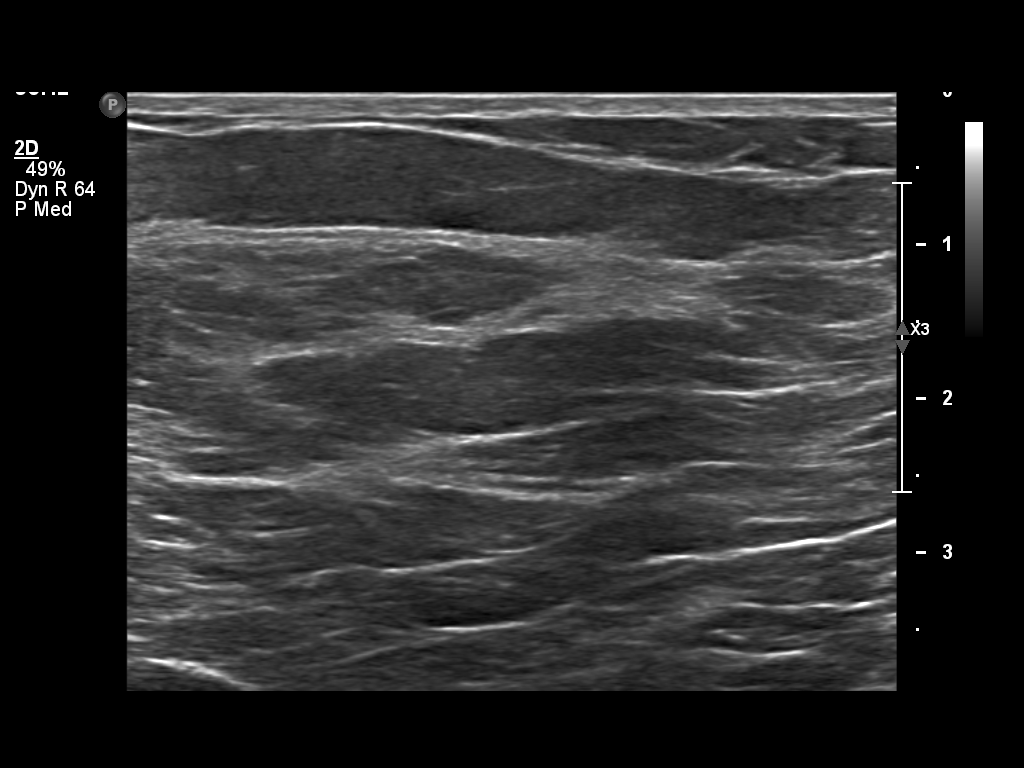
[im 5/6]
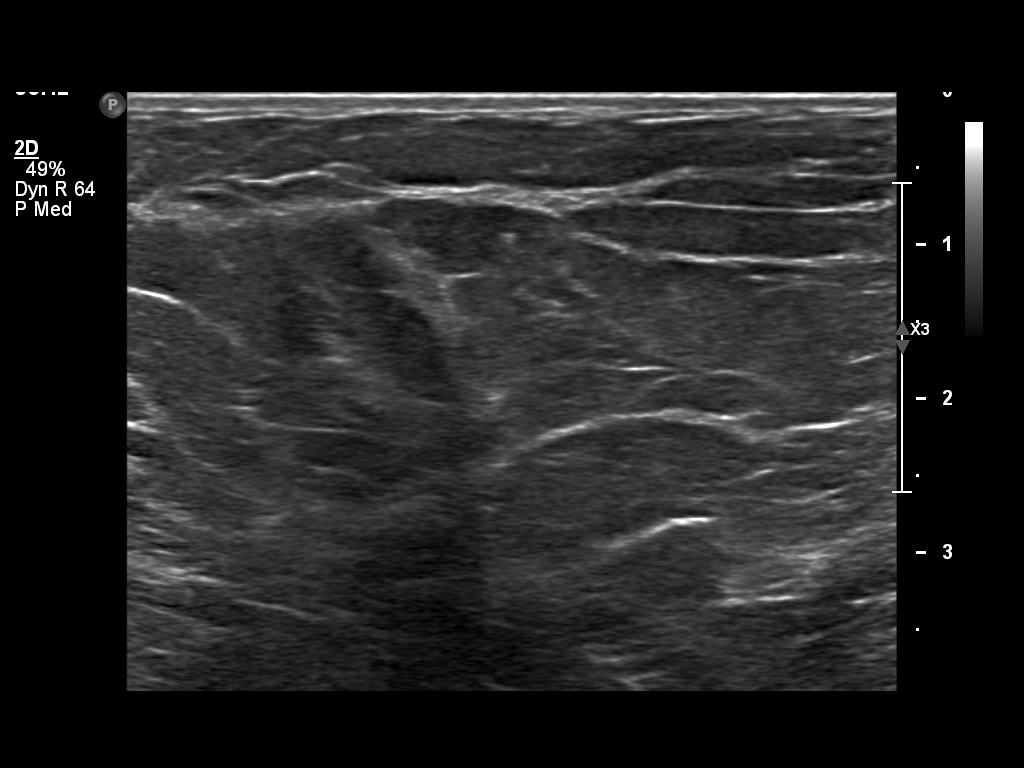
[im 6/6]
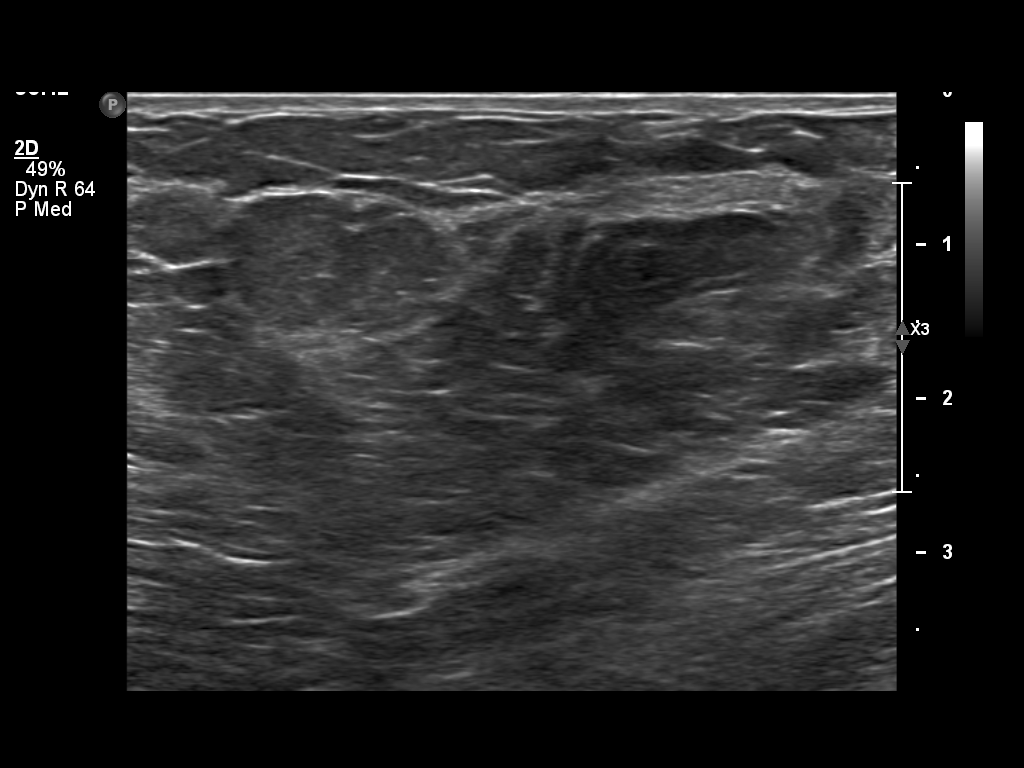

[6 of 6 positions shown; findings below may reference images not displayed]

FINDINGS: BILATERAL MAMMOGRAM:
BREAST DENSITY TYPE B: There are scattered areas of fibroglandular density.
There is a biopsy clip in the lower inner left breast. No findings to correspond with the sites of palpable concern in the breasts. There is no evidence of a dominant mass, suspicious cluster of calcifications, or architectural distortion in either breast. The skin, nipples, and both axillae are unremarkable.
LIMITED BILATERAL BREAST ULTRASOUND:
At the sites of [DATE] left breast 8 cm from the nipple, [DATE] right breast 4 cm from the nipple, [DATE] right breast 6 cm from the nipple, and [DATE] right breast 6 cm from the nipple, no underlying mass or cyst is visualized. There are no suspicious sonographic findings.
IMPRESSION: 1.  No mammographic or sonographic evidence of malignancy.
2.  No mammographic or sonographic findings to correspond with any of the sites of palpable concern in the breasts as described above.
While there is no mammographic or sonographic evidence of malignant disease, any clinically palpable abnormality should be managed on clinical grounds and independently from a negative report.
BI-RADS Category 1: Negative
Recommendation(s):
1. Bilateral screening mammogram in one year.
2. Clinical follow-up for patient's bilateral breast lumps.
The patient information will be entered into a reminder system with a target due date for the next mammogram.

## 2021-03-01 IMAGING — MG Diagnostic ComboHD
8 of 24 series · 8 of 40 positions shown · non-contrast
Comparison: None.

This is a summary report. The complete report is available in the patient's medical record. If you cannot access the medical record, please contact the sending organization for a detailed fax or copy.
FINAL REPORT:
EXAM: MAMMO BREAST DIAGNOSTIC BILATERAL, BREAST ULTRASOUND LEFT LIMITED, BREAST ULTRASOUND RIGHT LIMITED
CLINICAL HISTORY: 34-year-old female with bilateral breast lumps.
TECHNIQUE: MLO and CC views with tomosynthesis of the breasts were obtained on a digital full-field mammographic unit. Additional true lateral and spot compression MLO/CC views of the breasts were also performed. Nipple and skin markers were placed as needed. This examination was interpreted in conjunction with computer aided detection system. High-resolution real-time targeted bilateral breast ultrasound was also performed in the region(s) of interest.

[L CC synth-2D (1 of 2)]
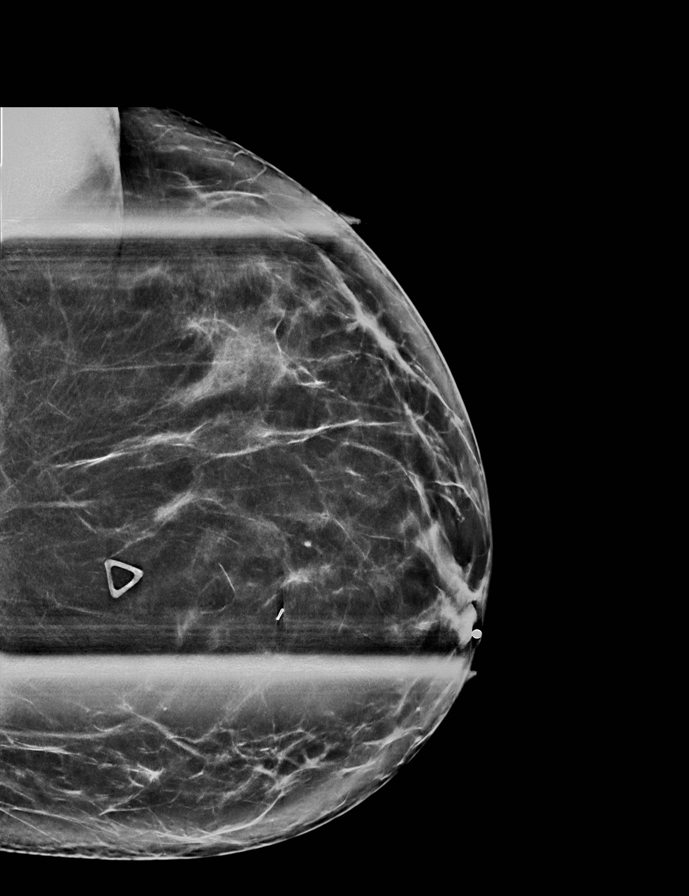

[R CC synth-2D]
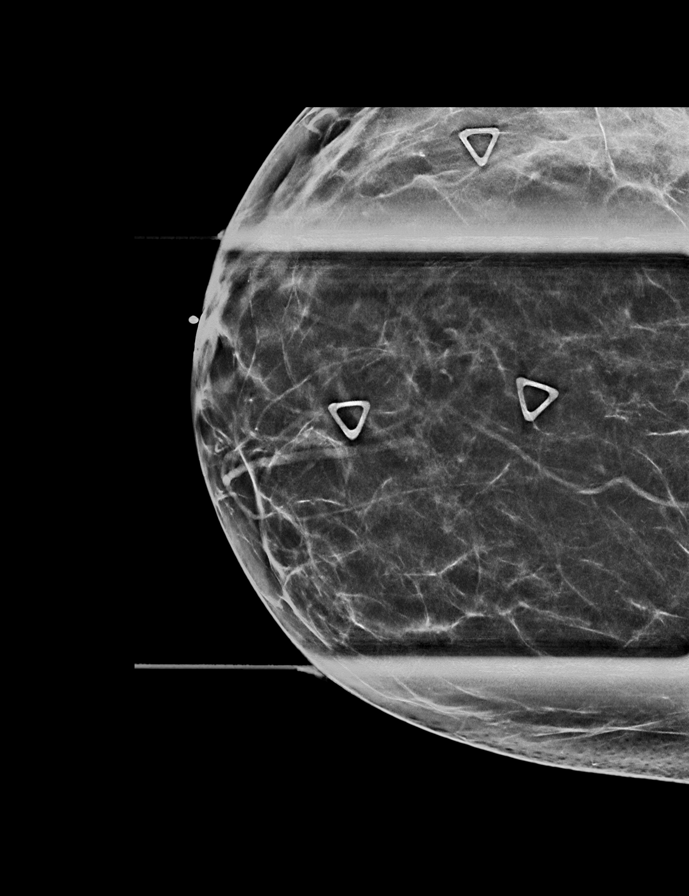

[L CC synth-2D (2 of 2)]
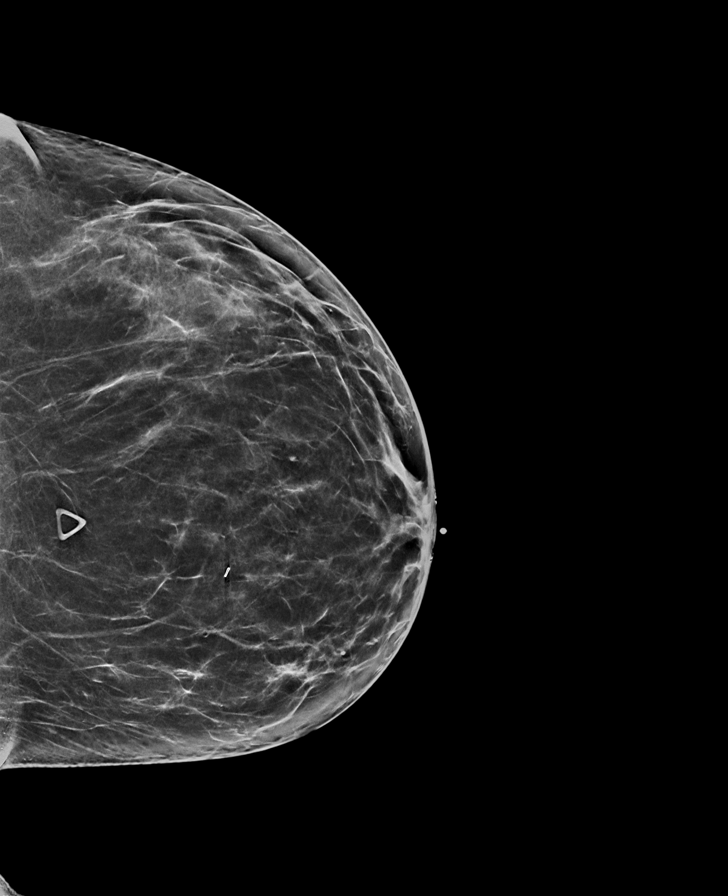

[R MLO synth-2D]
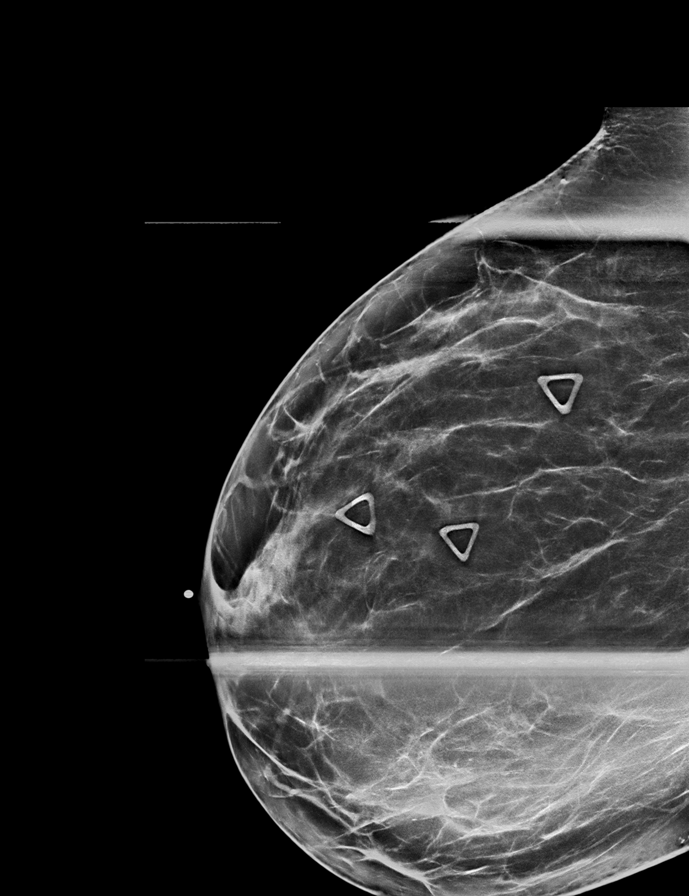

[L ML synth-2D]
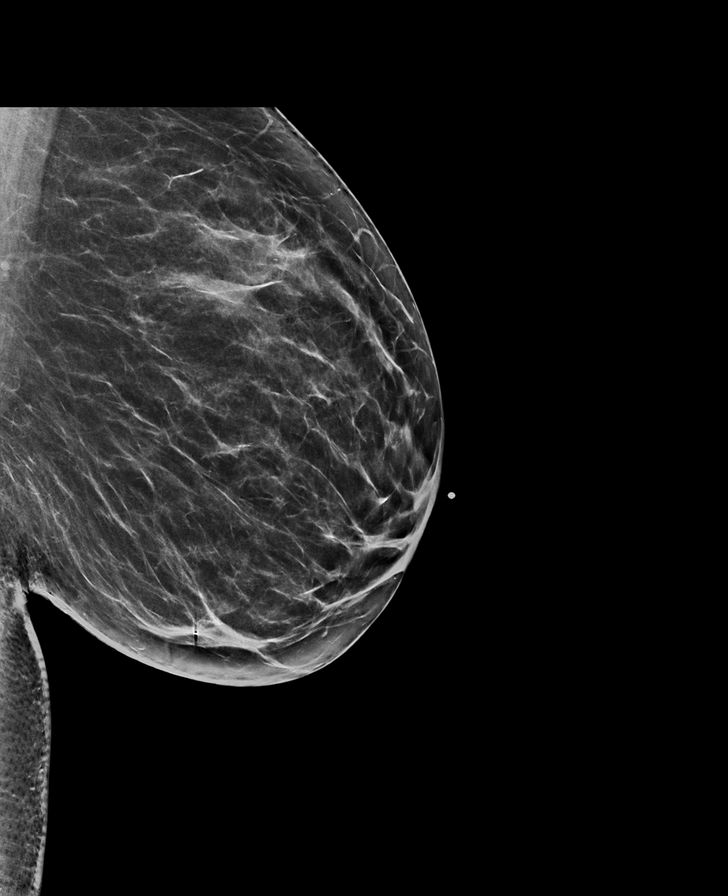

[L CC (1 of 3)]
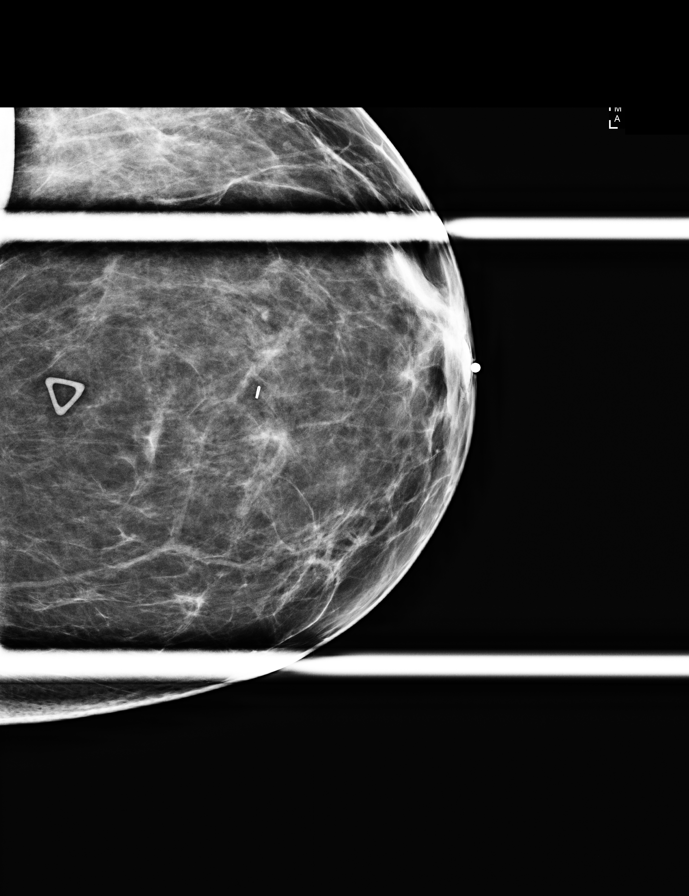

[L CC (2 of 3)]
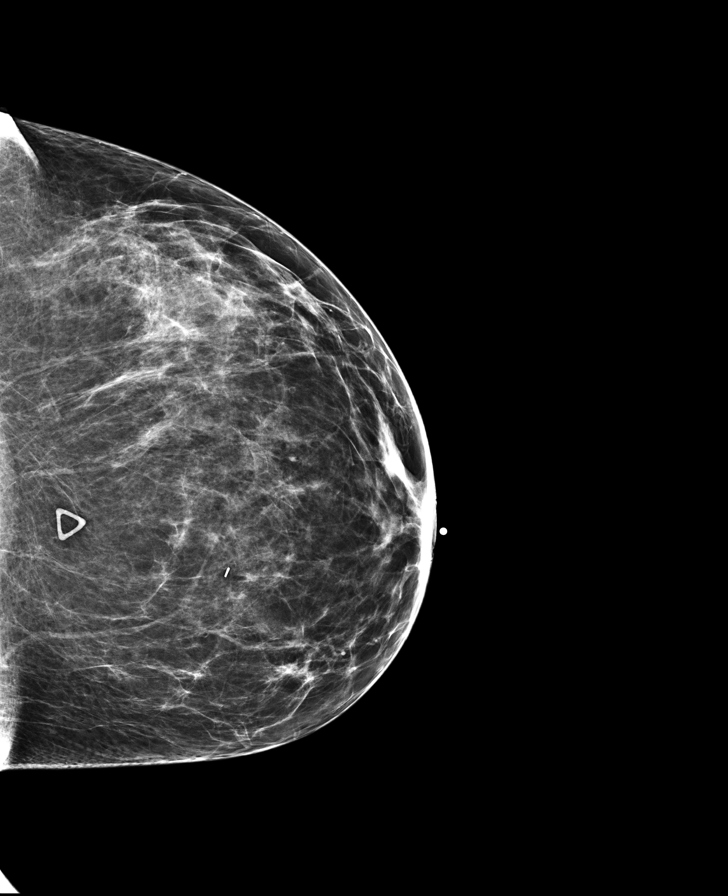

[L CC (3 of 3)]
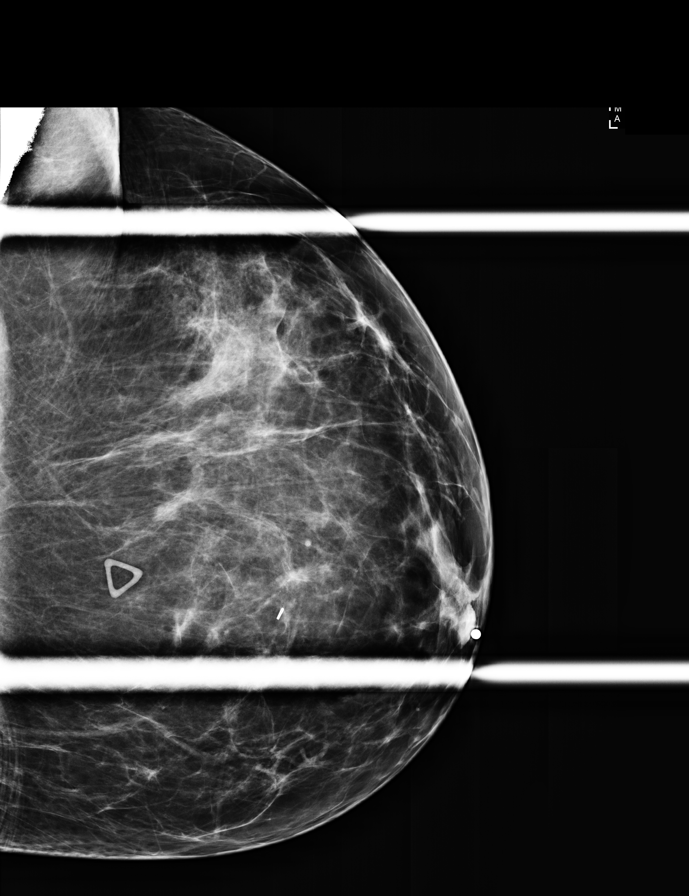

[8 of 40 positions shown; findings below may reference images not displayed]

FINDINGS: BILATERAL MAMMOGRAM:
BREAST DENSITY TYPE B: There are scattered areas of fibroglandular density.
There is a biopsy clip in the lower inner left breast. No findings to correspond with the sites of palpable concern in the breasts. There is no evidence of a dominant mass, suspicious cluster of calcifications, or architectural distortion in either breast. The skin, nipples, and both axillae are unremarkable.
LIMITED BILATERAL BREAST ULTRASOUND:
At the sites of [DATE] left breast 8 cm from the nipple, [DATE] right breast 4 cm from the nipple, [DATE] right breast 6 cm from the nipple, and [DATE] right breast 6 cm from the nipple, no underlying mass or cyst is visualized. There are no suspicious sonographic findings.
IMPRESSION: 1.  No mammographic or sonographic evidence of malignancy.
2.  No mammographic or sonographic findings to correspond with any of the sites of palpable concern in the breasts as described above.
While there is no mammographic or sonographic evidence of malignant disease, any clinically palpable abnormality should be managed on clinical grounds and independently from a negative report.
BI-RADS Category 1: Negative
Recommendation(s):
1. Bilateral screening mammogram in one year.
2. Clinical follow-up for patient's bilateral breast lumps.
The patient information will be entered into a reminder system with a target due date for the next mammogram.
Is the patient pregnant?
No

## 2021-04-21 IMAGING — US US ABDOMEN COMPLETE
1 series · 14 of 25 positions shown · non-contrast
Comparison: none

FINAL REPORT:
INDICATION: Right upper quadrant pain
Procedure: Complete abdominal ultrasound
TECHNIQUE: Multiplanar sonographic images were obtained of the complete abdomen.

[Series 1: us abdomen complete · 14 of 134 slices shown]
[im 1/134]
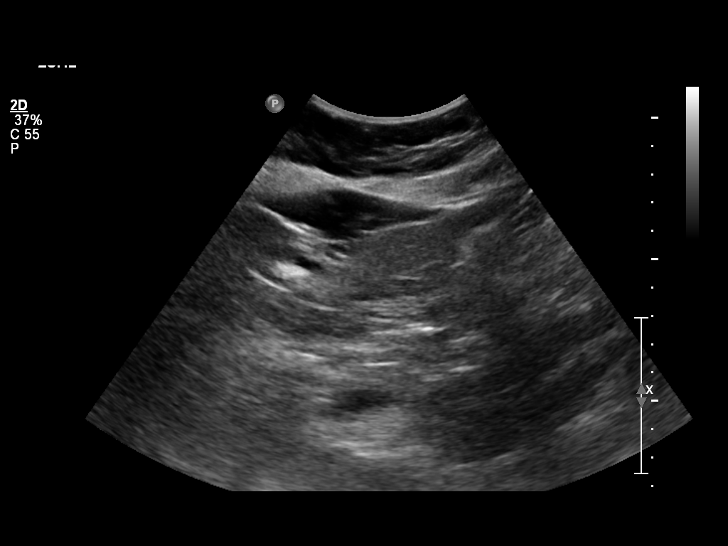
[im 12/134]
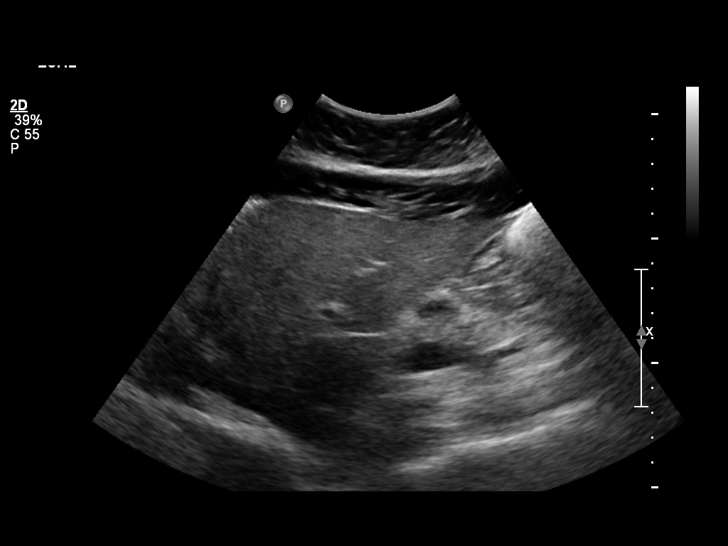
[im 23/134]
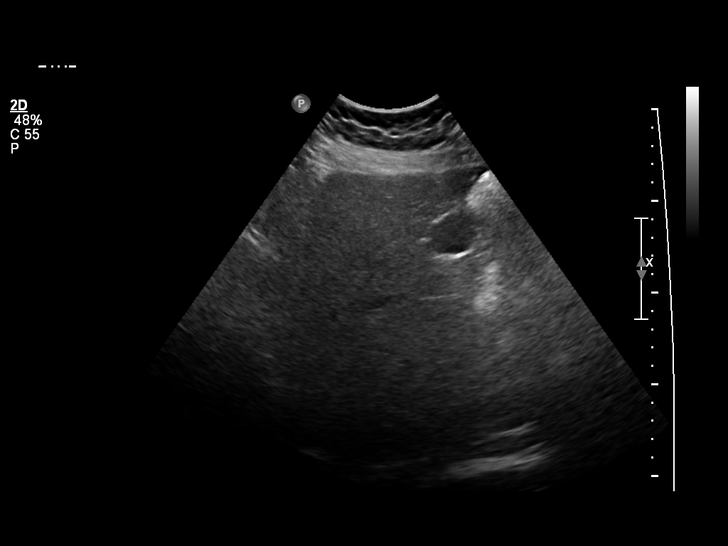
[im 34/134]
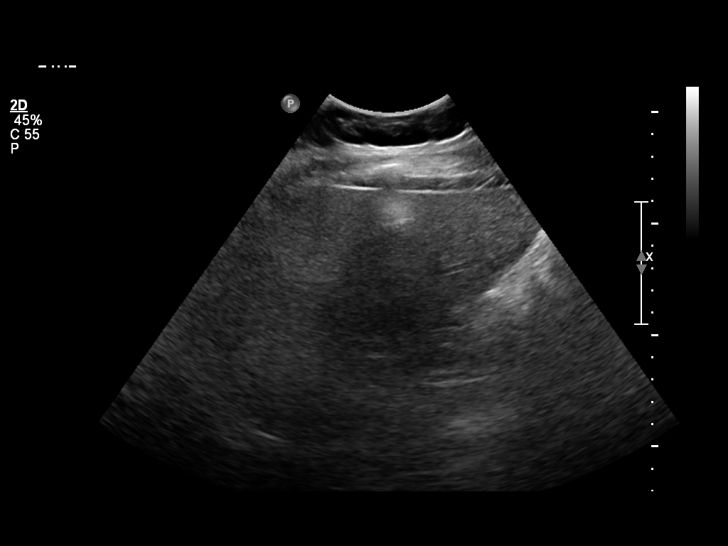
[im 45/134]
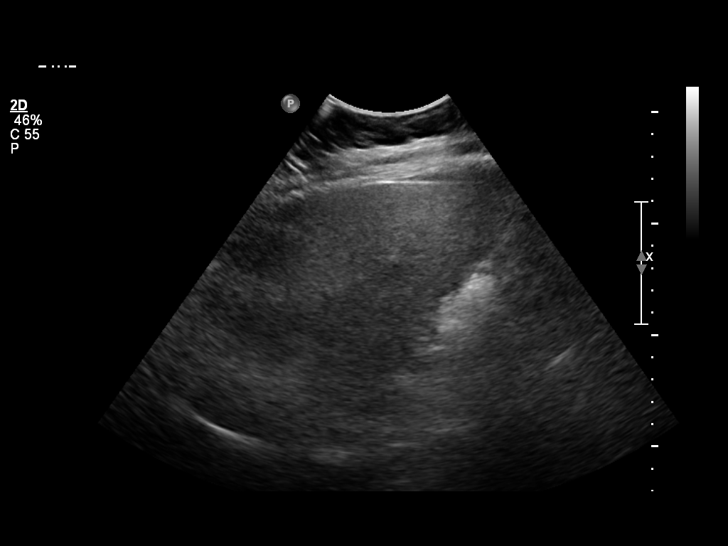
[im 50/134]
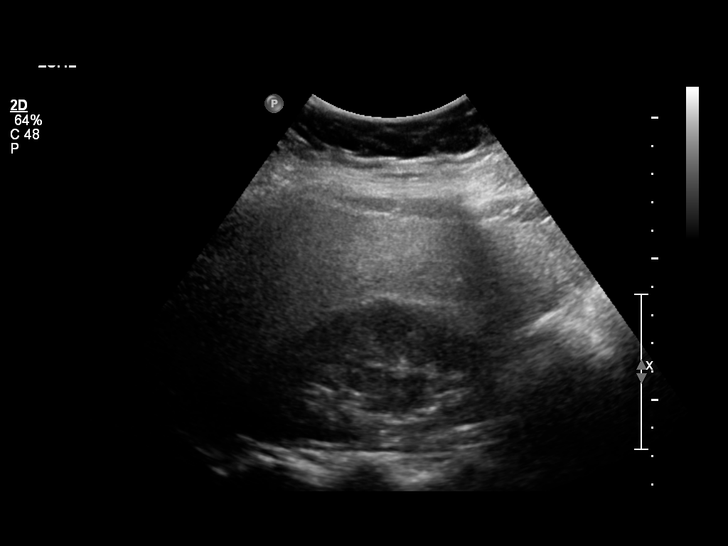
[im 61/134]
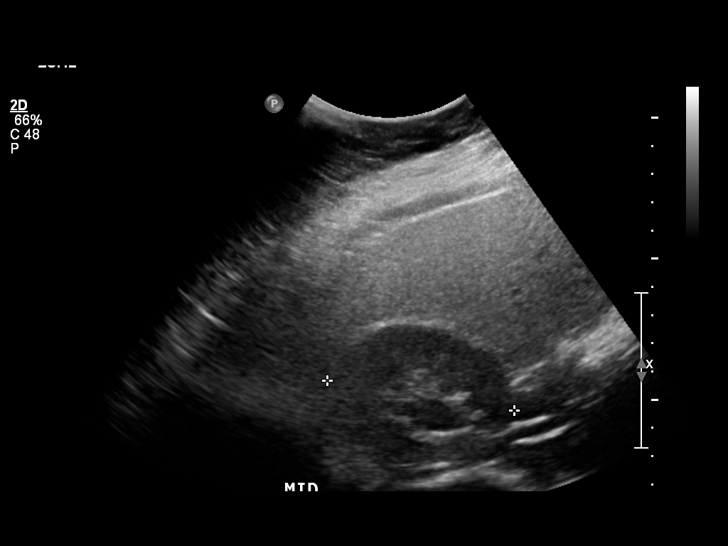
[im 73/134]
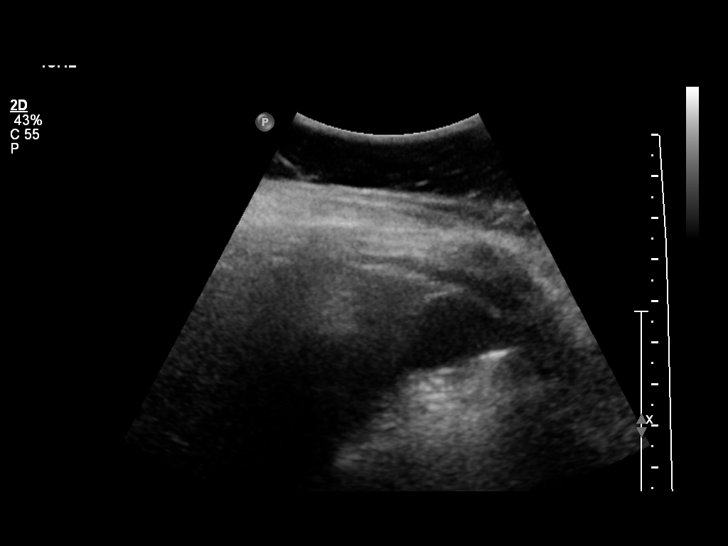
[im 84/134]
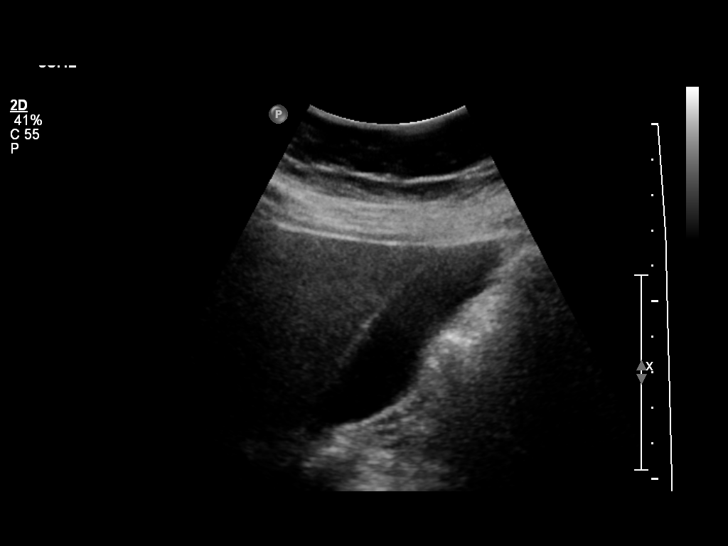
[im 89/134]
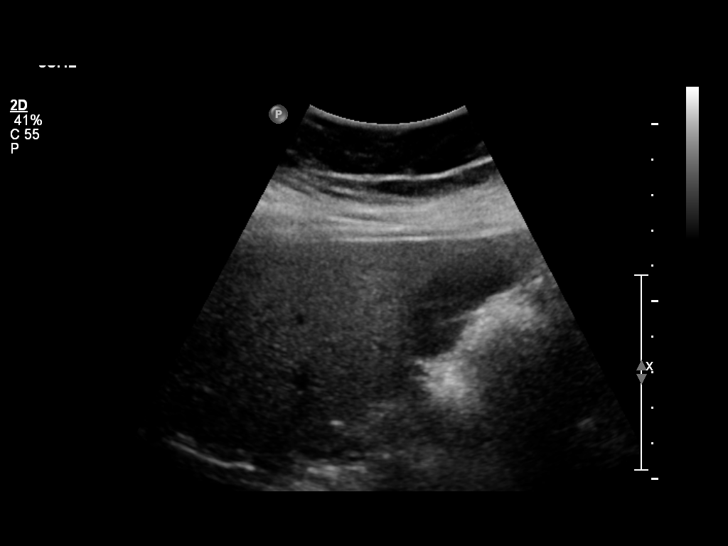
[im 100/134]
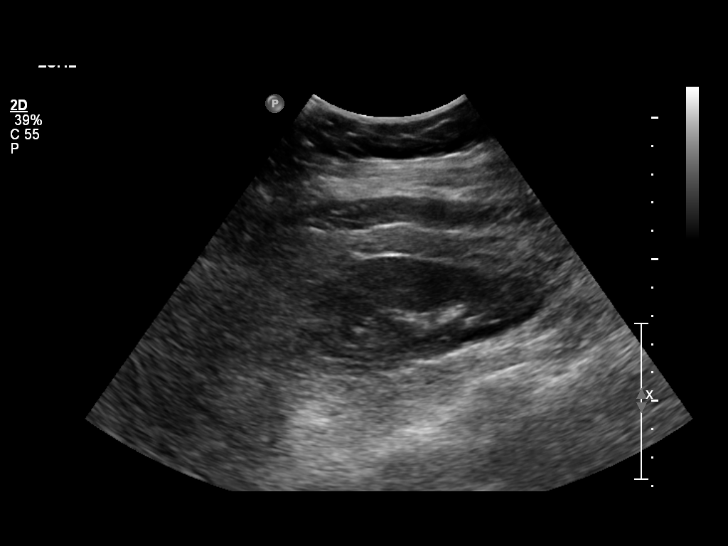
[im 111/134]
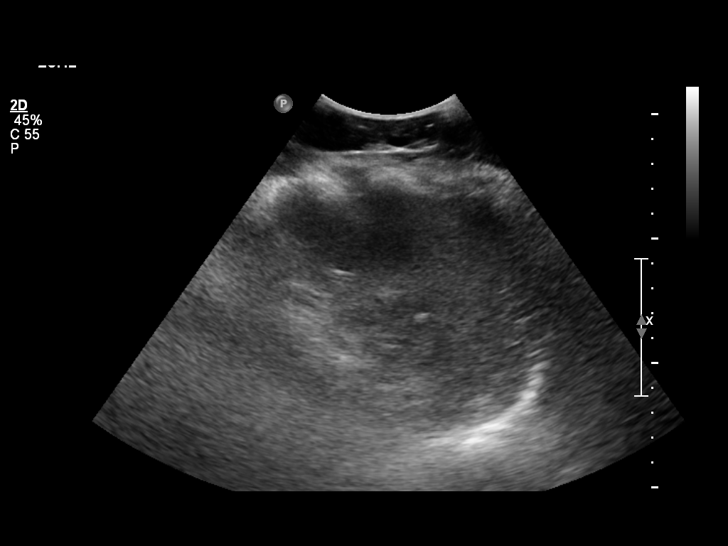
[im 122/134]
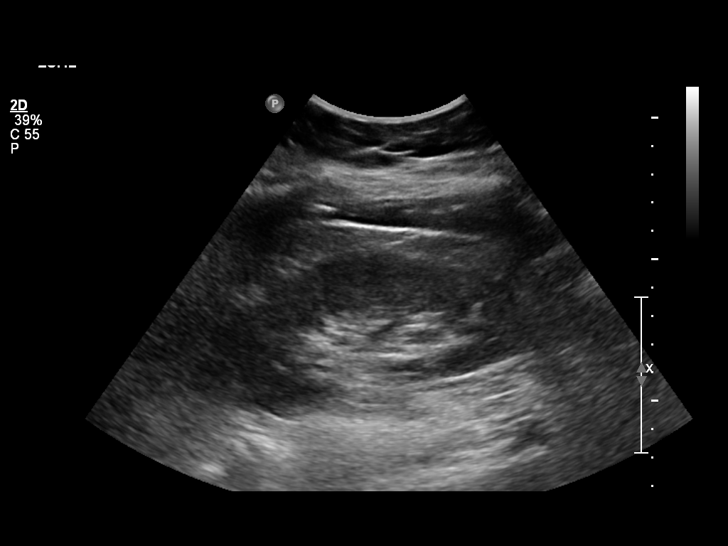
[im 134/134]
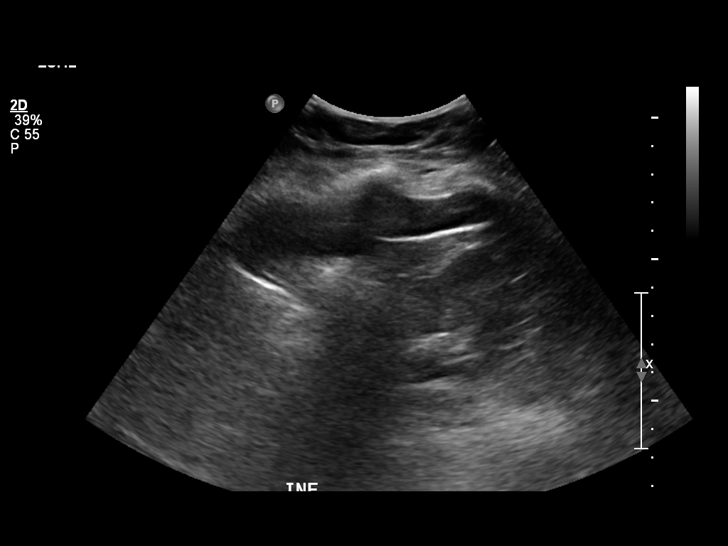

[14 of 25 positions shown; findings below may reference images not displayed]

FINDINGS: The visible pancreas, aorta, and IVC are unremarkable. Mild fatty liver. Spleen is unremarkable. Right and left kidneys are 11.1 and 10.4 cm in length with possible mild right-sided hydronephrosis. The gallbladder is unremarkable. The common duct is 4 mm.
IMPRESSION: 
IMPRESSION: 1. Mild fatty liver
2. Normal gallbladder and bile ducts
3. Suspected mild right renal hydronephrosis

## 2021-05-01 IMAGING — CR XR CHEST 1 VIEW
1 series · 1 of 1 positions shown · non-contrast
Comparison: none

Chest pain
FINAL REPORT:
INDICATION: Chest pain
Procedure: Portable chest.
Comparisons: August 13, 2020

[AP]
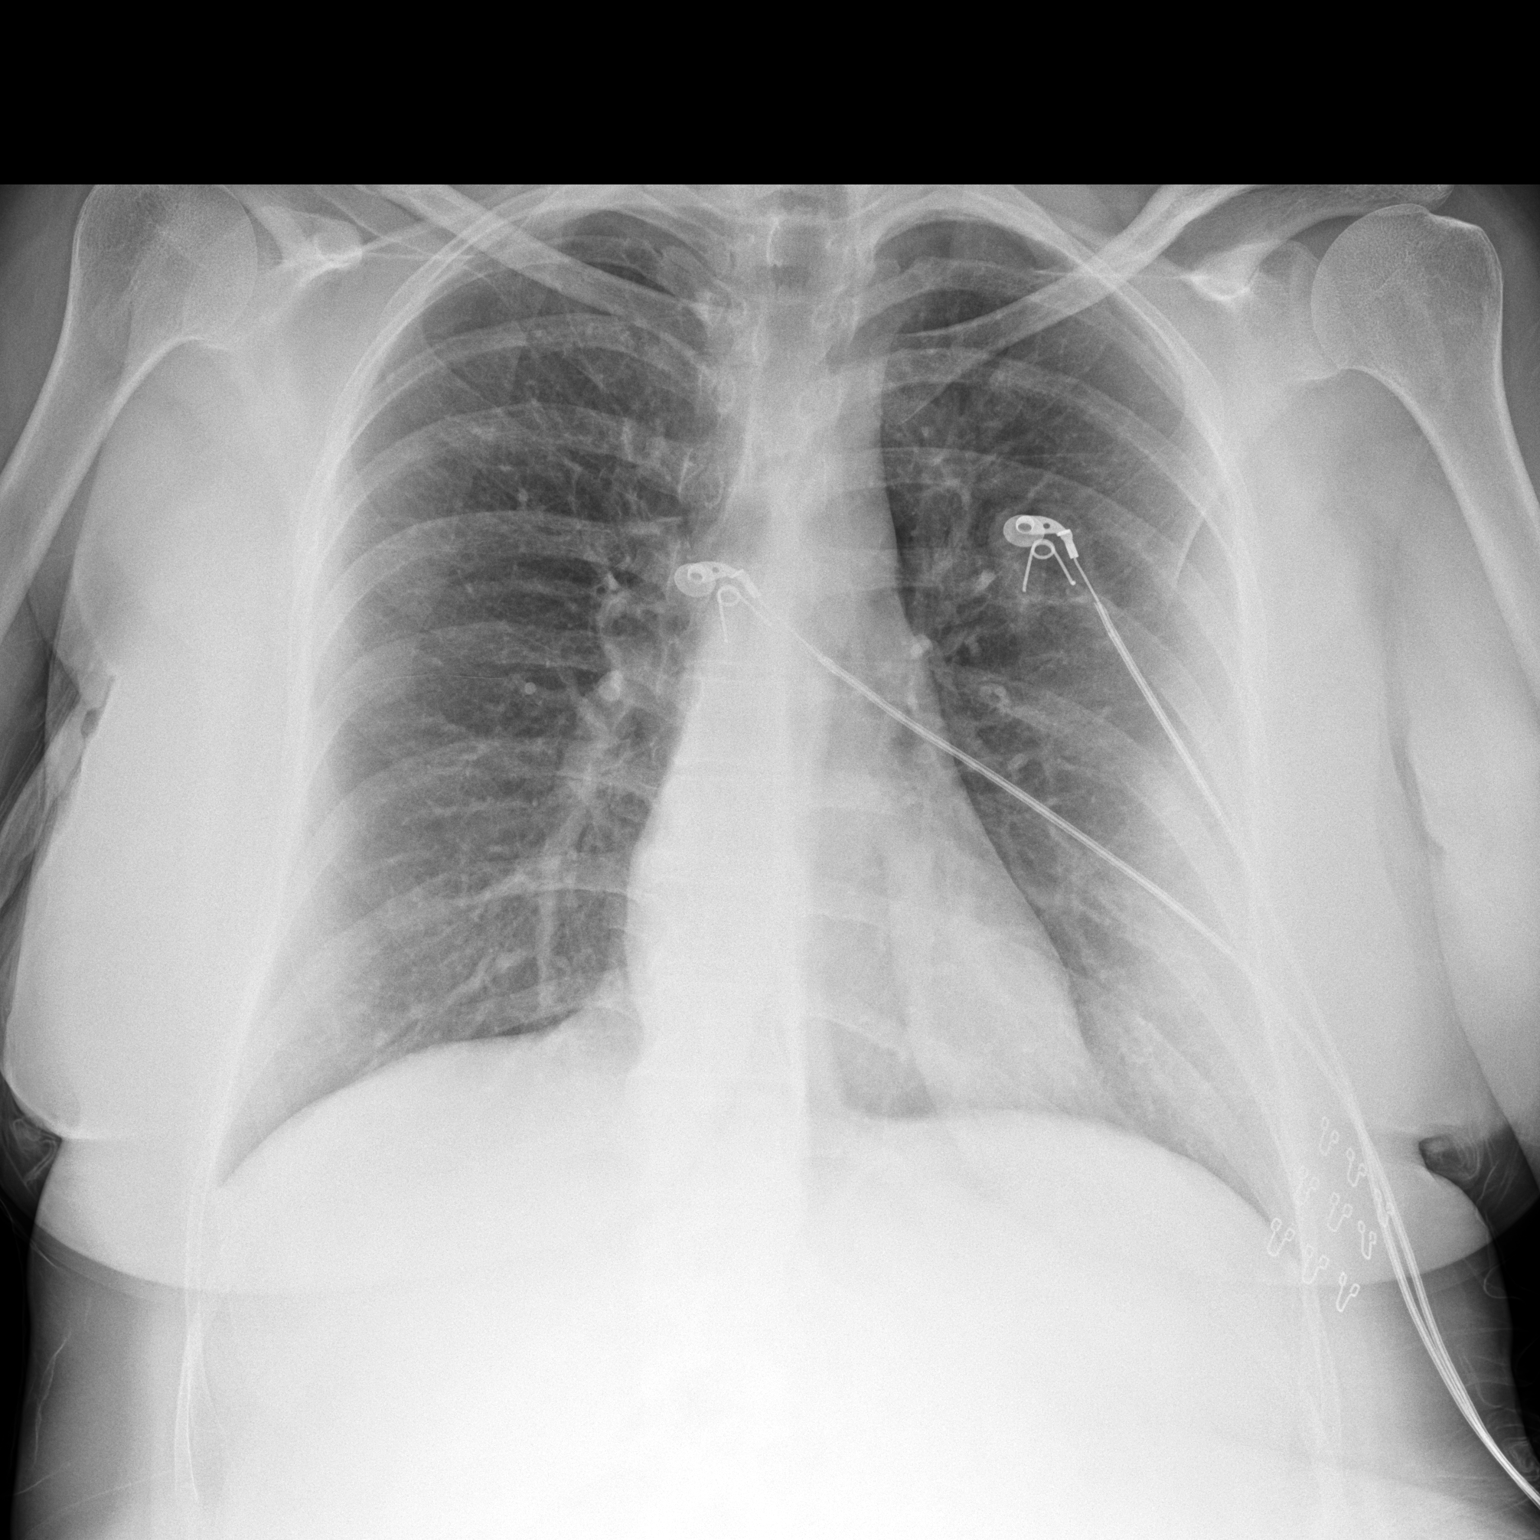

[1 of 1 positions shown; findings below may reference images not displayed]

FINDINGS: The lungs are symmetrically inflated and clear. The cardiac silhouette size is within normal limits.  No pulmonary edema. The CP angles are sharp. No pneumothorax.
IMPRESSION: 
IMPRESSION: No acute chest disease demonstrated.
Is the patient pregnant?
No
Portable?
Yes

## 2021-05-08 IMAGING — CT CT HEART CORONARY ANGIOGRAM
3 of 7 series · 6 of 20 positions shown, 7 images · IV contrast (isovue)
Comparison: none

FINAL REPORT:
Patient ID: 35-year-old woman with chest pain and equivocal stress test
Procedure: Computed tomography angiography, heart, and coronary arteries, with contrast material, including 3D image postprocessing (including evaluation of cardiac structure and morphology) done with [[facility]] and [employer] software.
Acquisition mode: Retrospective ECG triggering
Contrast type and volume: Isovue 85 ml
Complications: None known immediately
Image quality:  Excellent
Scanner: [employer] 64

[Series 8: 60.0% · axial · 0.42mm/px · z∈[-194,-148]mm · 2 of 348 slices shown, 3 images]
[im 116/348  vessel]
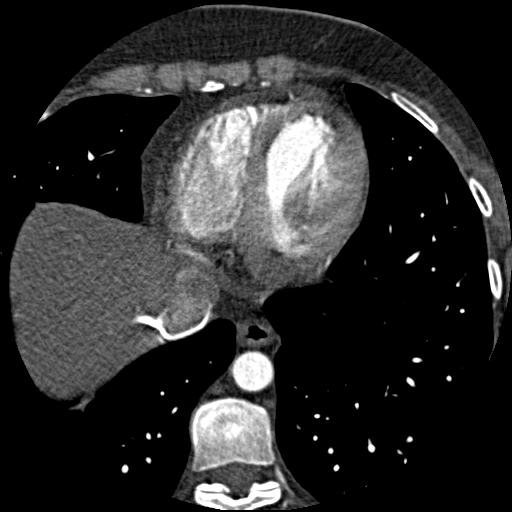
[im 116/348  lung]
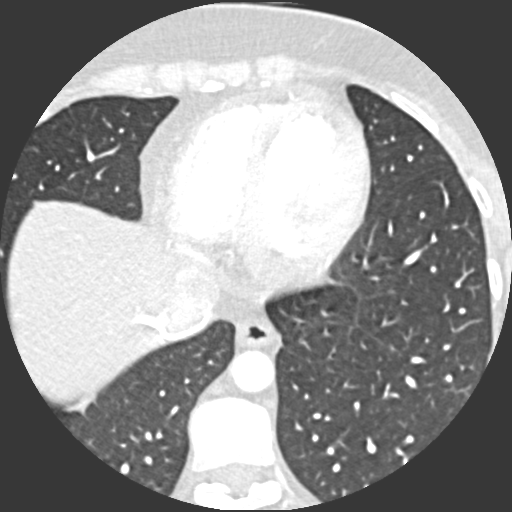
[im 232/348  vessel]
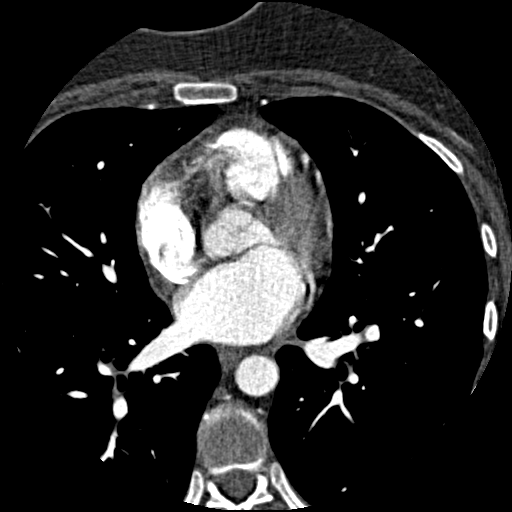

[Series 9: 70.0% · axial · 0.42mm/px · z∈[-194,-148]mm · 2 of 348 slices shown]
[im 116/348  vessel]
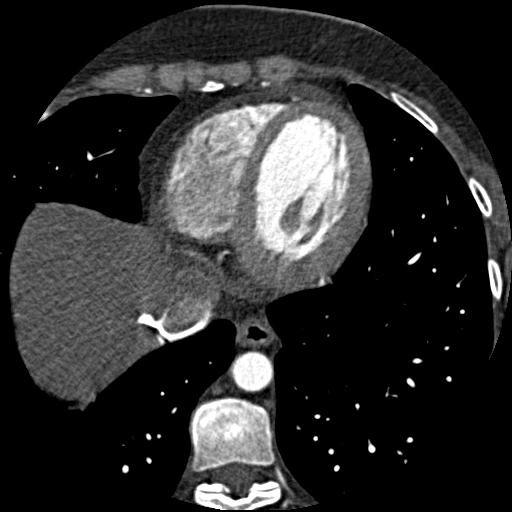
[im 232/348  vessel]
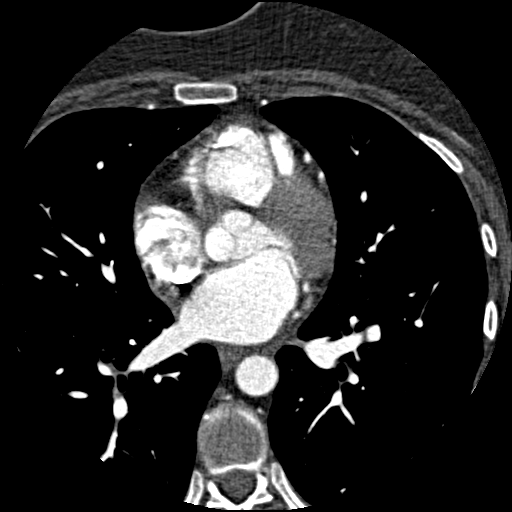

[Series 10: 80.0% · axial · 0.42mm/px · z∈[-194,-148]mm · 2 of 348 slices shown]
[im 116/348  vessel]
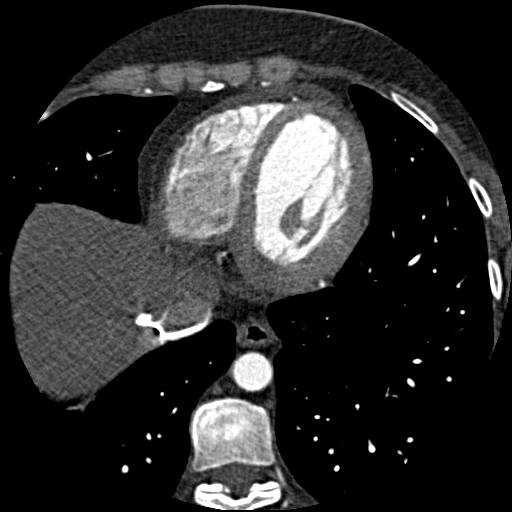
[im 232/348  vessel]
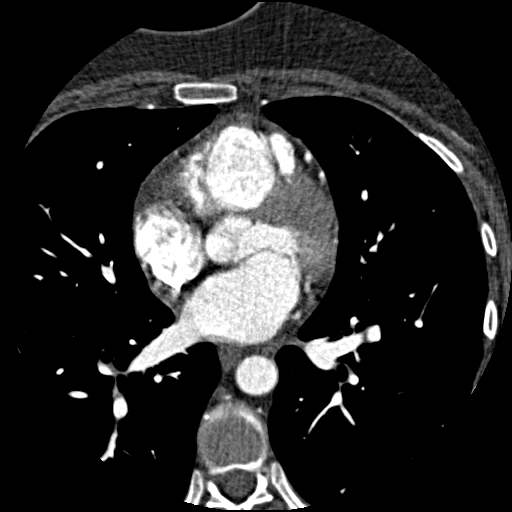

[6 of 20 positions shown; findings below may reference images not displayed]

FINDINGS: Coronary calcium
Agatston Score                      Volume
LM:     0                                   0
LAD:   0                                   0
RCA:   0                                  0
LCx:    0                                   0
Total:   0                                0
Coronary angiography:
Left main: The left main arises from the left coronary cusp.
The left main bifurcates to form the left anterior descending artery and left circumflex coronary artery.  The vessel is patent with no evidence of significant plaque or stenosis.
Left anterior descending artery:  The left anterior descending is patent with no evidence of significant plaque or stenosis.
Left Circumflex artery:  The left circumflex is the dominant channel, with no evidence of significant plaque or stenosis.
Right coronary artery:  Right coronary arises from right coronary cusp.  There is motion artifact in the mid RCA which limits interpretation.  RCA is otherwise angiographically normal, with no evidence of significant plaque or stenosis.
Pulmonary Arteries:  The main pulmonary artery, as well as the right and left pulmonary arteries appear to enhance normally
Pulmonary Veins:  There are 4 pulmonary veins without any significant abnormality
Pericardium:  No significant pericardial effusion noted
Cardiac Valves:
The aortic valve appears to be tricuspid.
Mitral valve is without any significant abnormalities.
Aorta:  No thoracic aneurysm or dissection noted
Extra-Cardiac Findings: Please refer to radiology findings on noncardiac structures reported separately.
IMPRESSION: 1. Total calcium score: 0
2.  No evidence of significant coronary stenosis or plaque by coronary CT angiography.
RECOMMENDATIONS:
Risk factor modification
Consider stress testing to evaluate for ischemia if clinically indicated in the distribution of RCA due to limited evaluation, secondary to motion artifact
Thanks for the referral
Giil, Palito
Is the patient pregnant?
No

## 2021-05-22 IMAGING — NM Gallbladder EF
2 series · 2 of 2 positions shown · non-contrast
Comparison: Ultrasound abdomen from 04/21/2021.

5 mCi choletec Left AC
FINAL REPORT:
HIDA scan and gallbladder ejection fraction.
INDICATION: Right upper quadrant pain .
TECHNIQUE: Patient was injected with 5 mCi of technetium 99m Choletec. Images were obtained for 60 minutes. Patient then drank 8 ounces of Boost. Additional image was obtained after 100 minutes. Gallbladder ejection fraction was calculated.

[Series 1000: post gbef · 2.70mm/px · 1 of 1 slices shown]
[im 1/1]
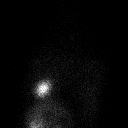

[Series 1000: pre gbef · non-contrast · 2.70mm/px · 1 of 1 slices shown]
[im 1/1]
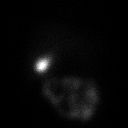

[2 of 2 positions shown; findings below may reference images not displayed]

FINDINGS: There is prompt immediate uptake of tracer throughout the liver. Gallbladder uptake is seen by 5 minutes. Small bowel uptake is seen by 15 minutes. Gallbladder ejection fraction was calculated at 69% (greater than 35% is considered normal).
IMPRESSION: 
IMPRESSION: Normal HIDA scan and gallbladder ejection fraction.
Is the patient pregnant?
No

## 2021-06-30 IMAGING — CT CT ABDOMEN PELVIS WITHOUT CONTRAST
2 of 3 series · 16 of 46 positions shown, 18 images · non-contrast
Comparison: 11/16/2004.

Lower left abdominal pain, left sided flank pain
Hx of appendectomy
No hx of cancer
FINAL REPORT:
CT scan of the abdomen and pelvis June 30, 2021 at 0744 hours
CLINICAL HISTORY: Flank pain, kidney stone suspected.
TECHNIQUE: Helical axial sections with sagittal and coronal reformats of the abdomen and pelvis were obtained without intravenous contrast. Iterative reconstruction technique was employed to reduce patient radiation exposure.
Radiation Dose: Total exam DLP 616 mGy/cm.

[Series 2: renal stone · axial · 0.80mm/px · z∈[-478,-70]mm · 13 of 189 slices shown, 15 images]
[im 13/189  soft-tissue]
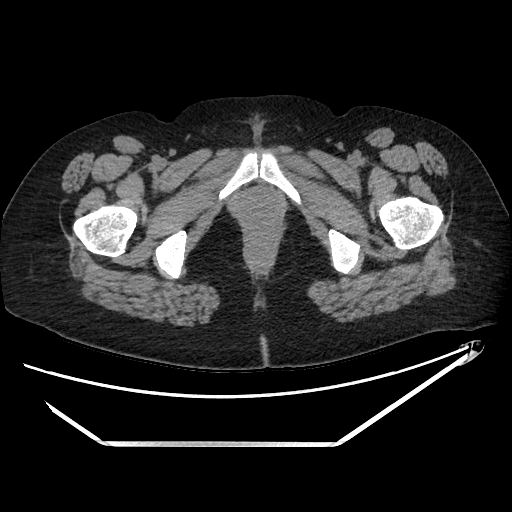
[im 13/189  bone]
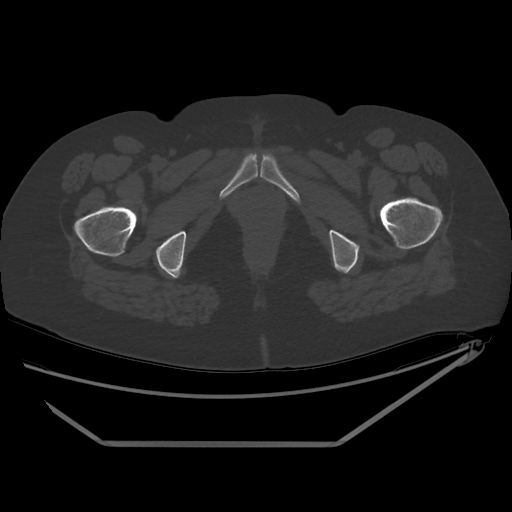
[im 25/189  soft-tissue]
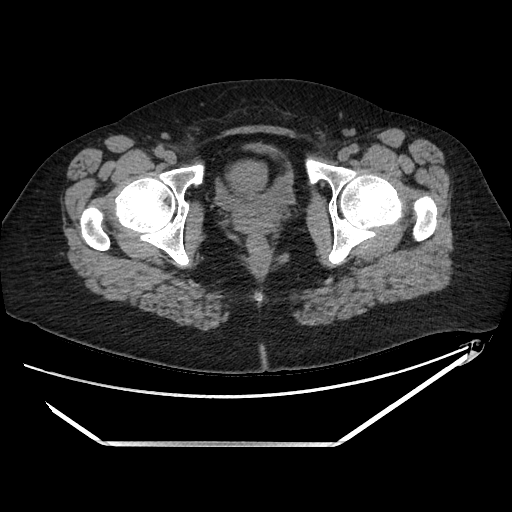
[im 37/189  soft-tissue]
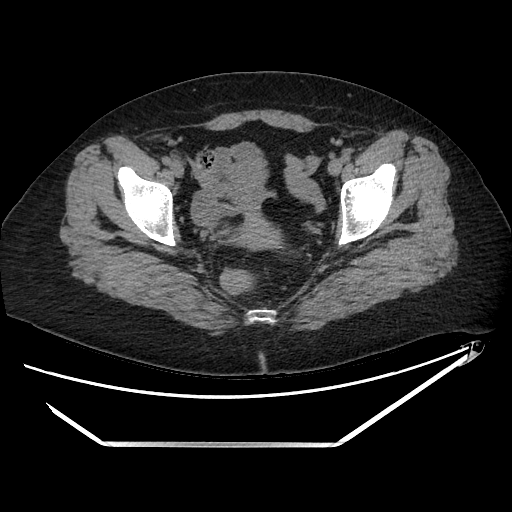
[im 55/189  soft-tissue]
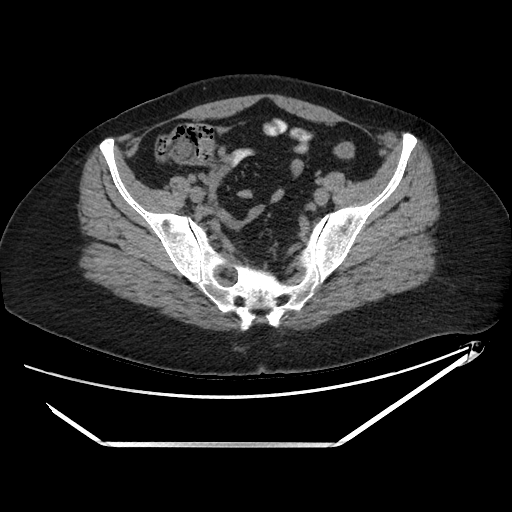
[im 67/189  soft-tissue]
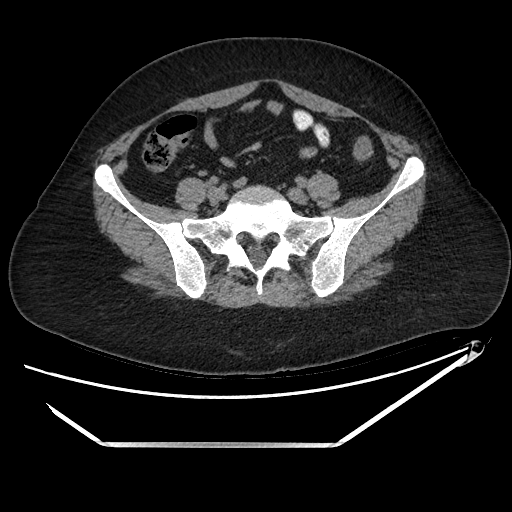
[im 79/189  soft-tissue]
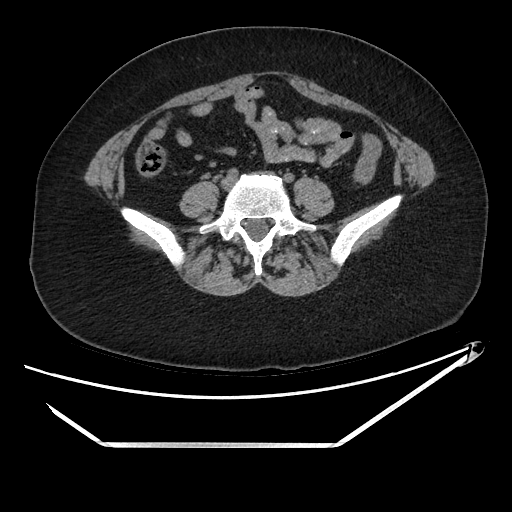
[im 98/189  soft-tissue]
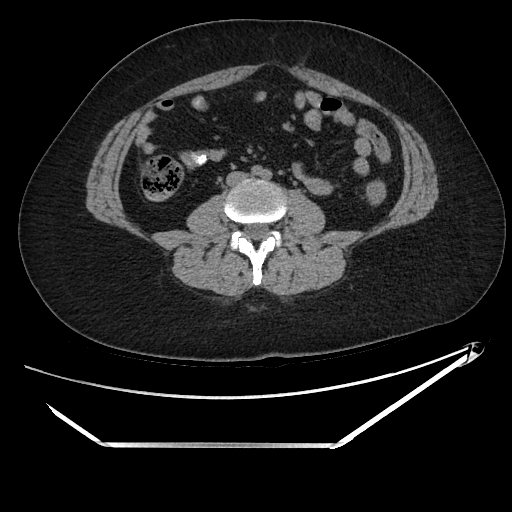
[im 110/189  soft-tissue]
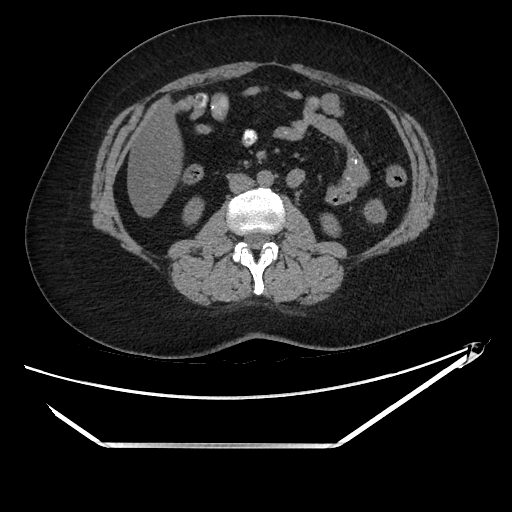
[im 122/189  soft-tissue]
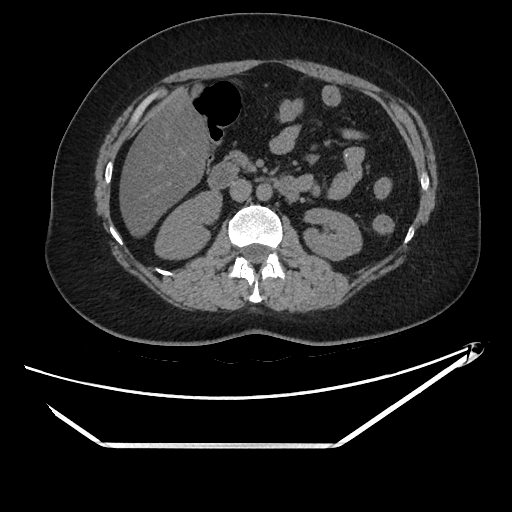
[im 122/189  bone]
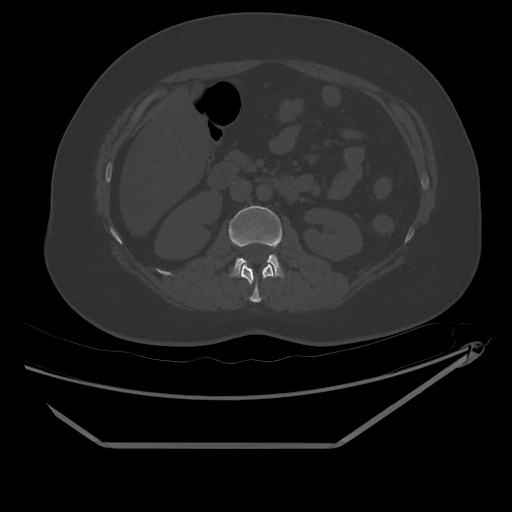
[im 134/189  soft-tissue]
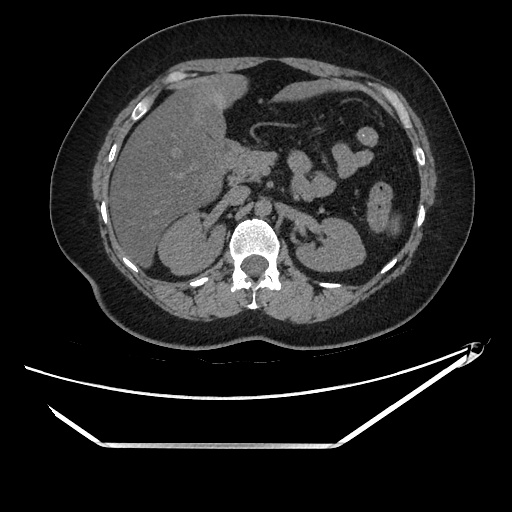
[im 152/189  soft-tissue]
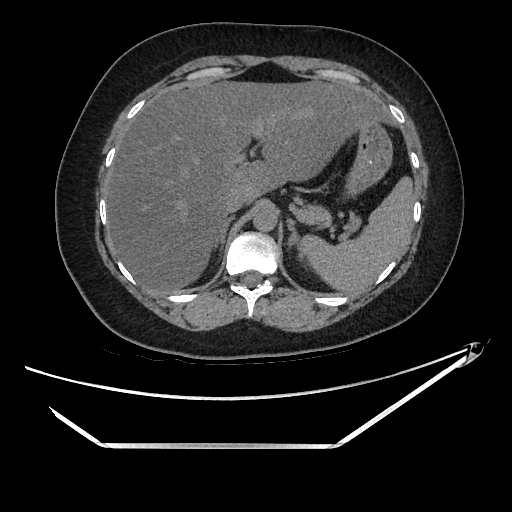
[im 164/189  soft-tissue]
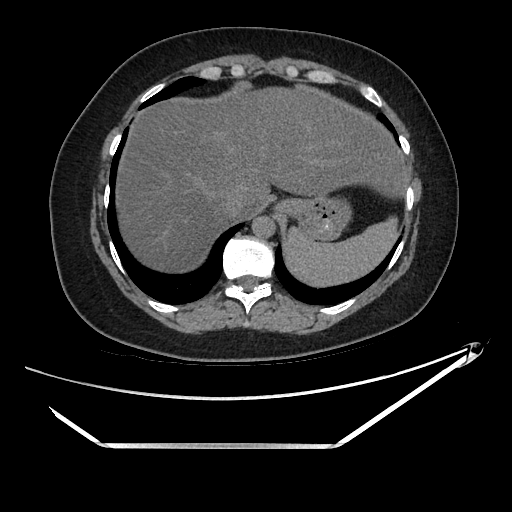
[im 176/189  soft-tissue]
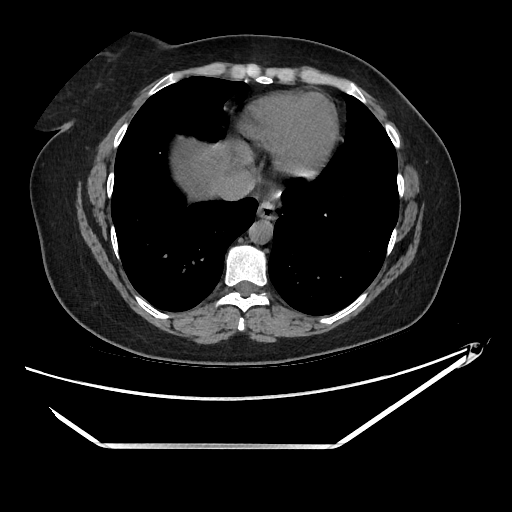

[Series 602: sag standard 2x2 · sagittal · 0.92mm/px · 3 of 206 slices shown]
[im 69/206  soft-tissue]
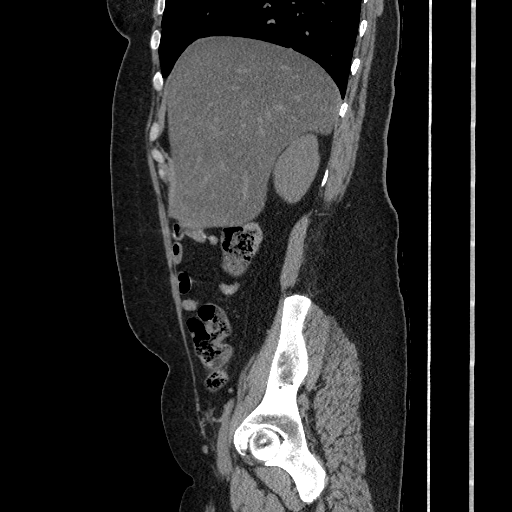
[im 92/206  soft-tissue]
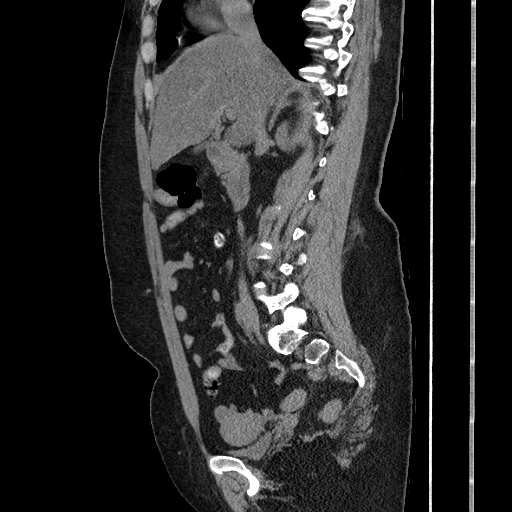
[im 114/206  soft-tissue]
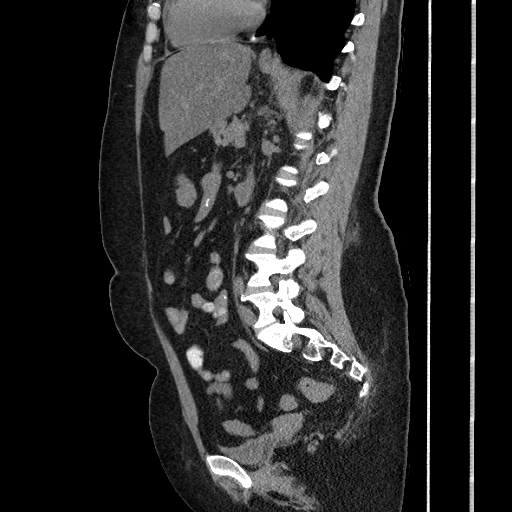

[16 of 46 positions shown; findings below may reference images not displayed]

FINDINGS: The lung bases are clear.
There is mild hepatomegaly with fatty infiltration of the liver. There is an apparent hyperdense lesion in the subcapsular region of the right lobe of liver (coronal image 83/149), measuring 1.8 cm, likely subcapsular hemangioma for focal fat sparing. The gallbladder, pancreas, spleen, adrenals and kidneys are unremarkable on this noncontrast study. No renal/ureteric calculus or hydronephrosis.
There is no evidence of bowel obstruction. The appendix is not visualized, likely surgically absent. There is underdistention versus wall thickening of the descending colon with subtle pericolonic fat stranding. There is a small fat containing umbilical hernia.
The urinary bladder is incompletely distended. There is a 3 mm calcific focus at the posterior aspect of the urinary bladder, which appears to be extraluminal, likely uterine calcification. The uterus otherwise appears unremarkable. There is no free fluid or free air.
Mild degenerative changes are identified in the spine.
IMPRESSION: 
IMPRESSION: 1. No evidence of renal/ureteric calculus or hydronephrosis.
2. Underdistention versus mild wall thickening of the descending colon with subtle pericolonic fat stranding, mild colitis cannot be entirely excluded. Recommend clinical correlation.
3. Mild hepatomegaly with fatty infiltration.
4. Other findings as described above.
All CT scans at this facility use iterative reconstruction technique, dose modulation and/or weight based dosing when appropriate to reduce radiation dose to as low as reasonably achievable.
Is the patient pregnant?
No

## 2021-07-14 IMAGING — US US HEAD NECK SOFT TISSUE
1 series · 14 of 25 positions shown · non-contrast
Comparison: none

FINAL REPORT:
INDICATION: Generalized enlarged lymph nodes
EXAM: Ultrasound of the head and neck

[Series 1: us head neck soft tissue · 14 of 31 slices shown]
[im 1/31]
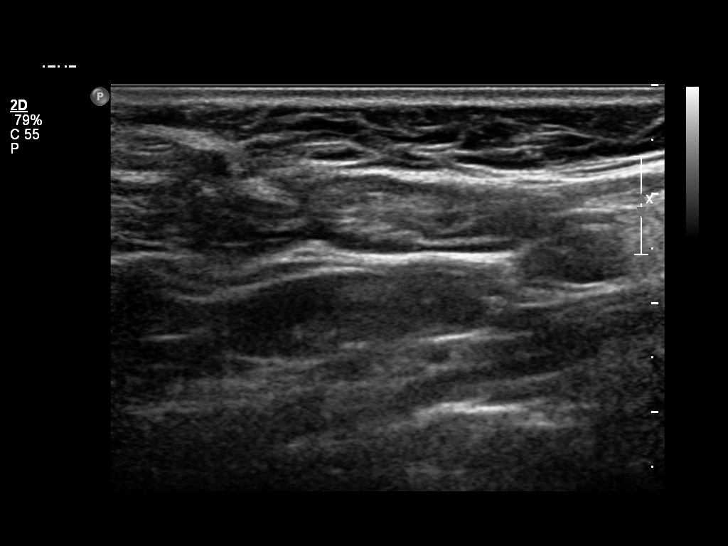
[im 3/31]
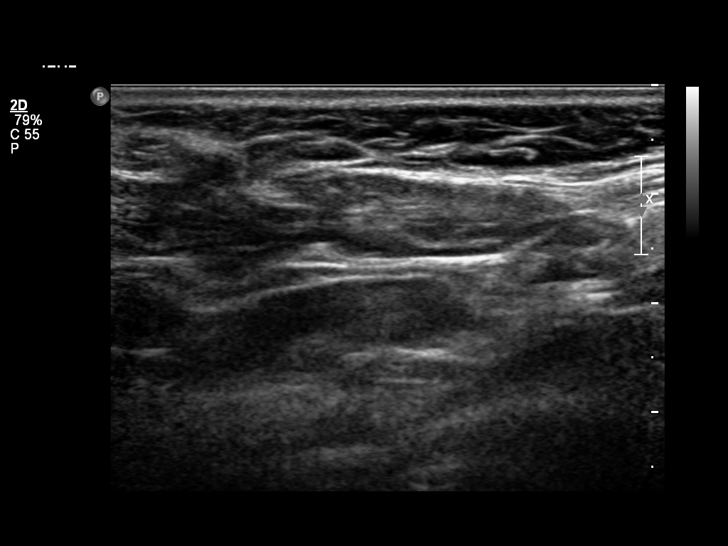
[im 6/31]
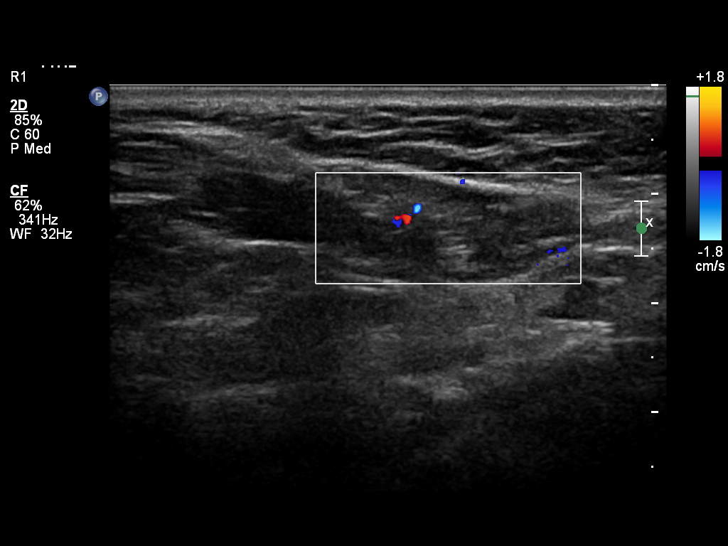
[im 8/31]
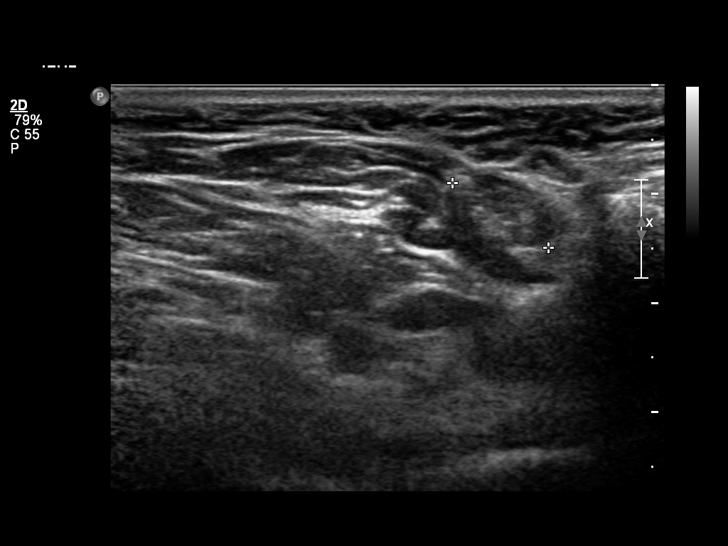
[im 11/31]
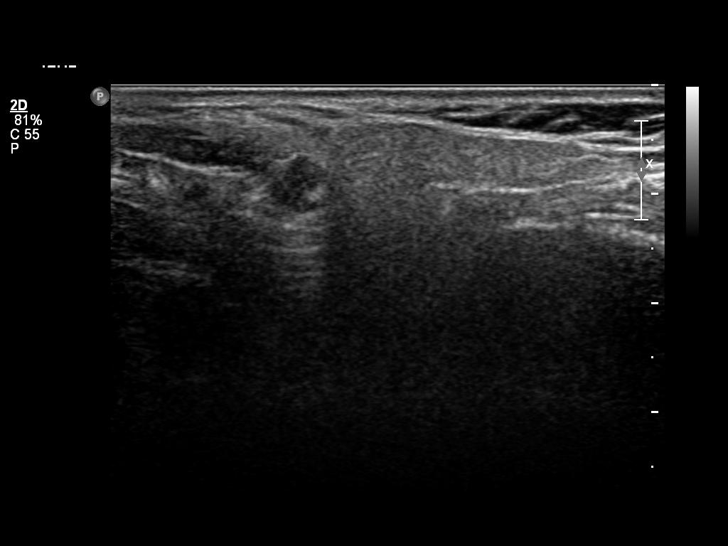
[im 12/31]
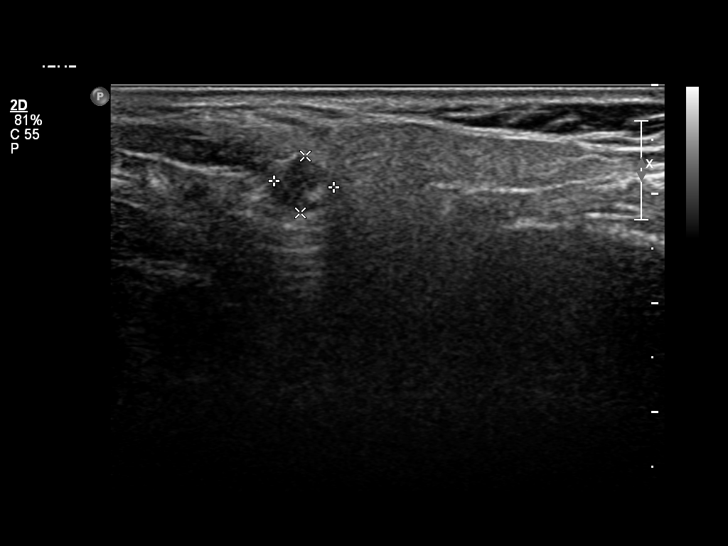
[im 14/31]
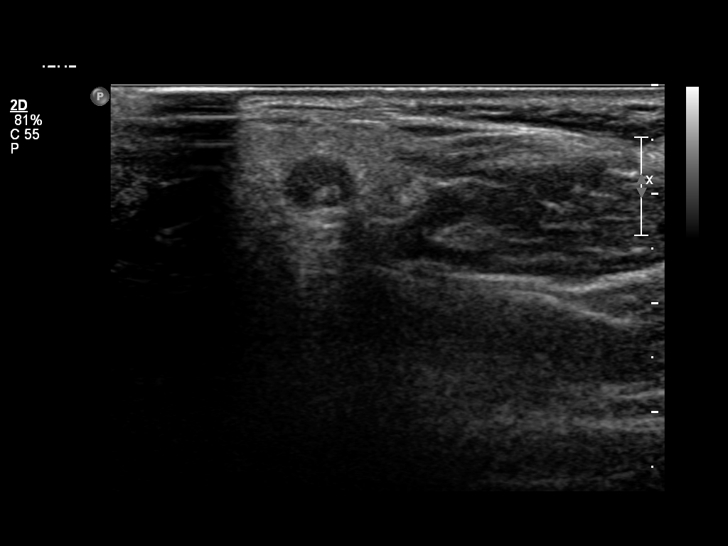
[im 17/31]
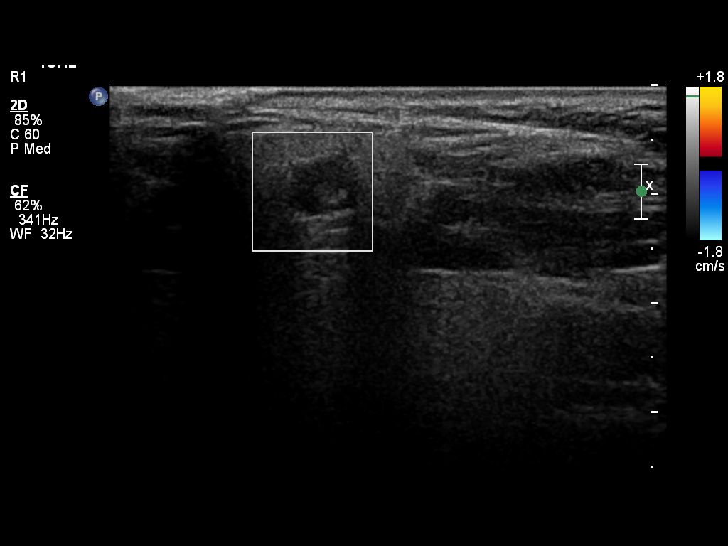
[im 19/31]
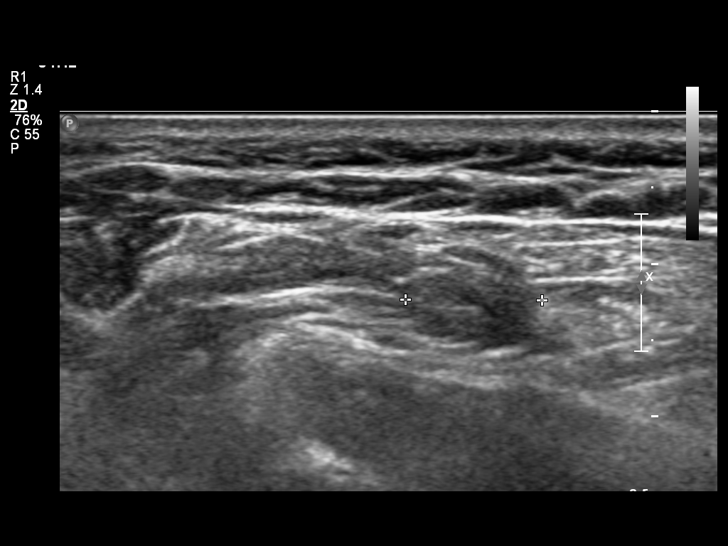
[im 21/31]
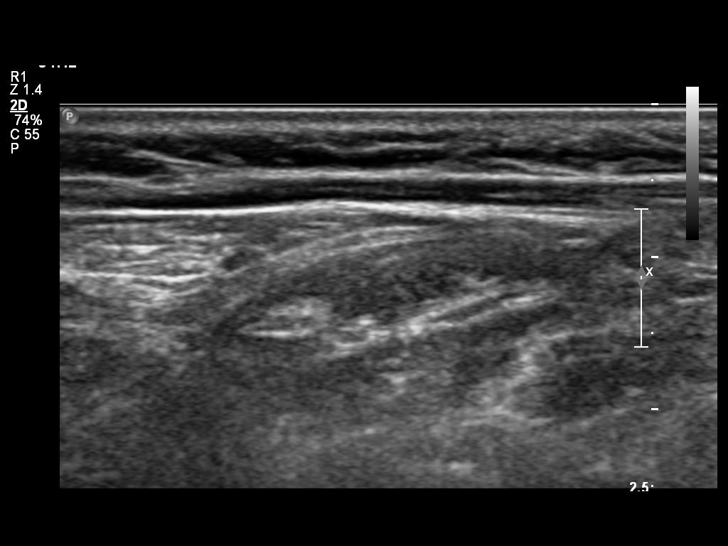
[im 23/31]
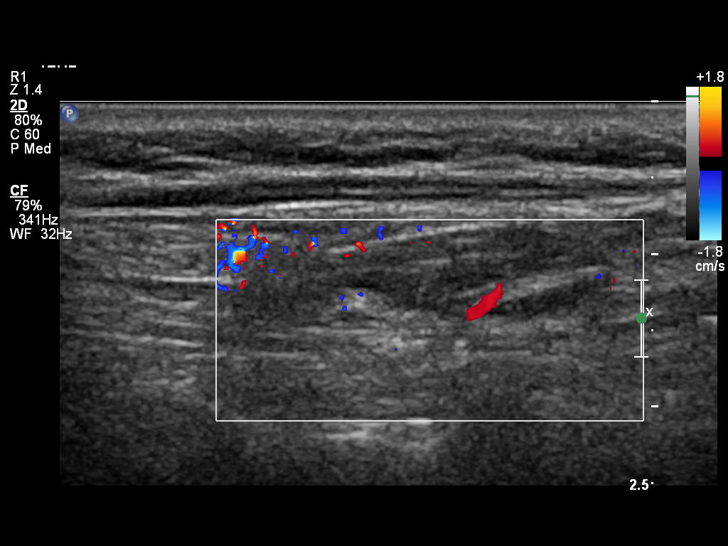
[im 26/31]
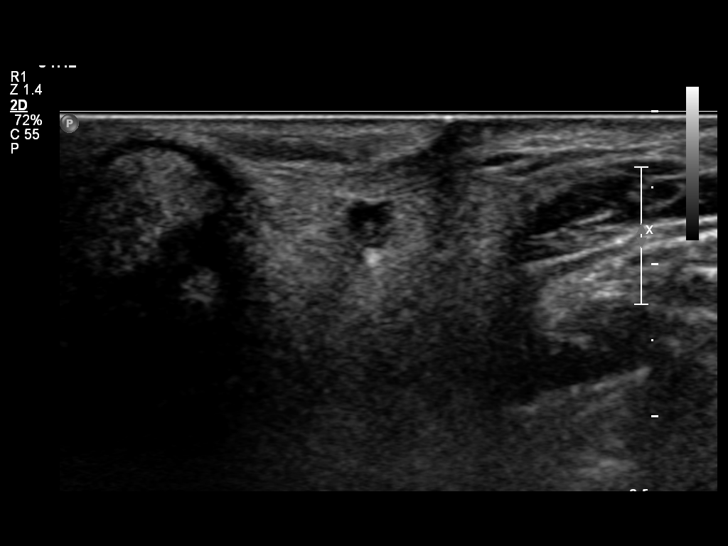
[im 28/31]
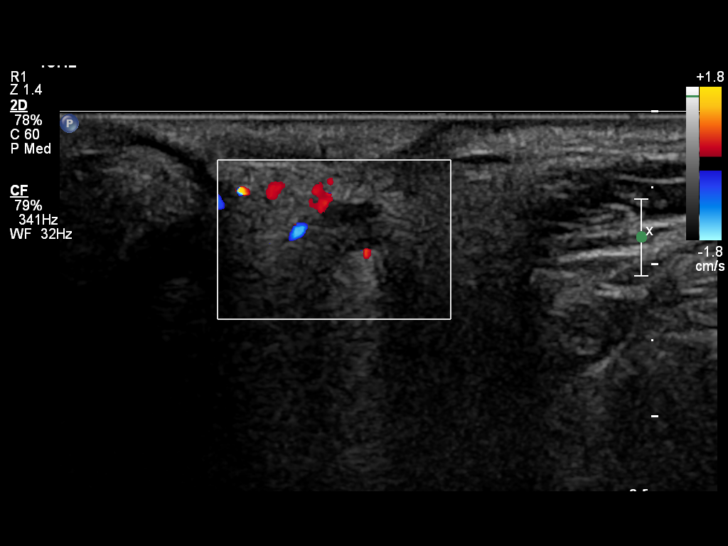
[im 31/31]
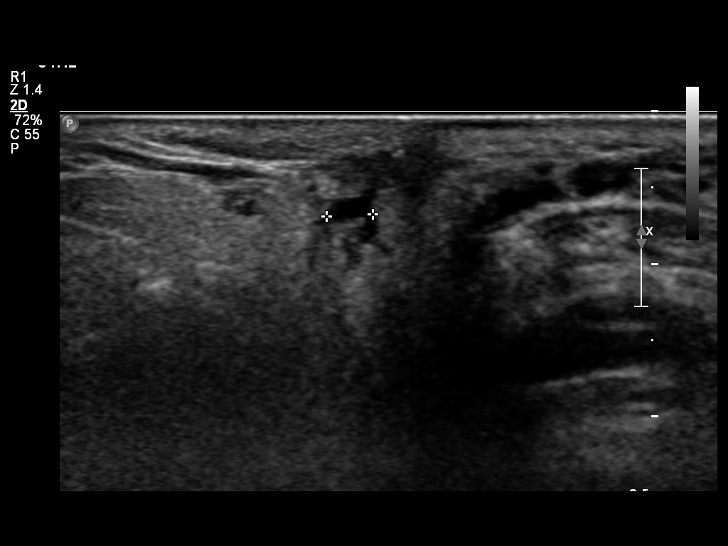

[14 of 25 positions shown; findings below may reference images not displayed]

FINDINGS: Ultrasound bilateral head and neck shows a few mildly enlarged lymph nodes particularly in the inferior periauricular region measuring about 2.1 x 0.7 x 1.1 cm on the right and 2.6 x 0.8 x 0.9 cm on the left. A few additional lymph nodes in the lower neck have mildly thickened cortices. Overall however these appear fairly benign with visualized central fatty hila.
IMPRESSION: 1. Mild nonspecific adenopathy. These may be reactive.

## 2021-07-28 IMAGING — US US RENAL COMPLETE
1 series · 14 of 25 positions shown · non-contrast
Comparison: none

FINAL REPORT:
US Renal
INDICATION: Unspecified hydronephrosis Encounter type initial
Procedure: Renal ultrasound
TECHNIQUE: Multiplanar sonographic images were obtained of the complete abdomen.

[Series 1: us renal complete · 14 of 58 slices shown]
[im 1/58]
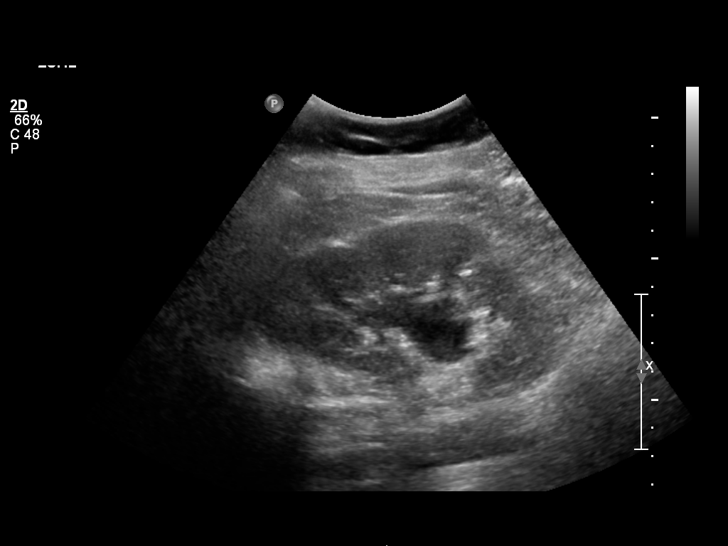
[im 5/58]
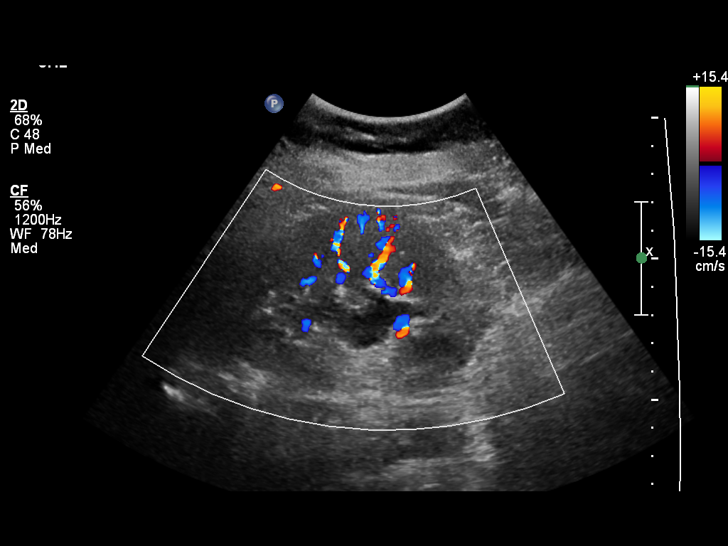
[im 10/58]
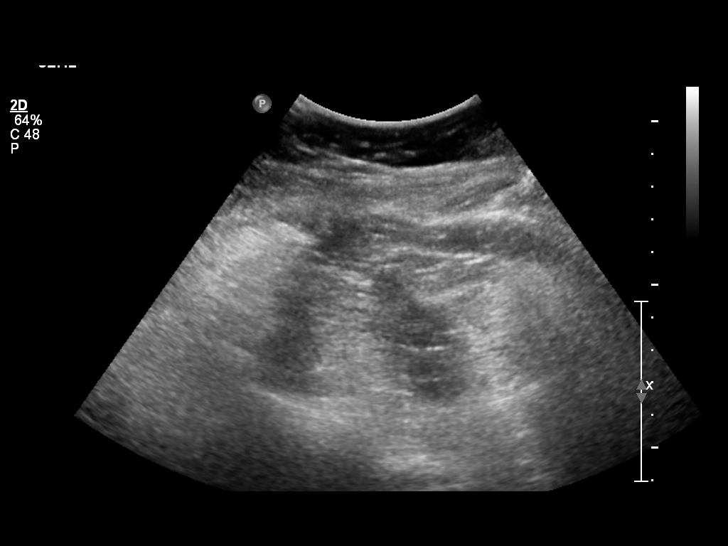
[im 15/58]
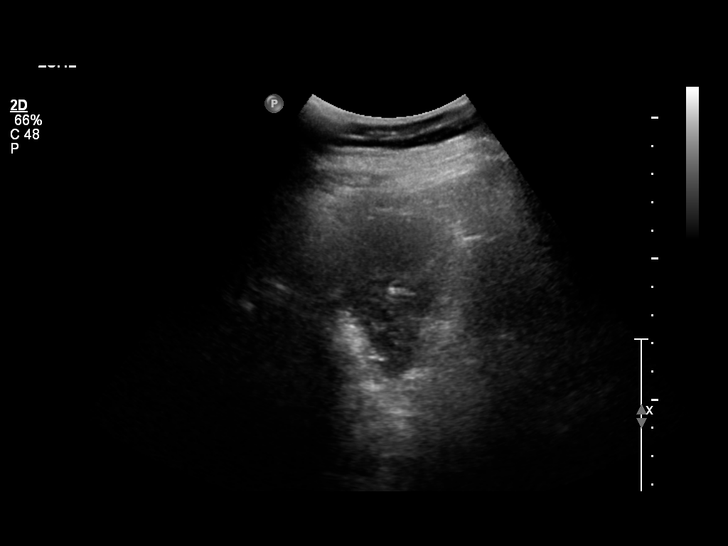
[im 20/58]
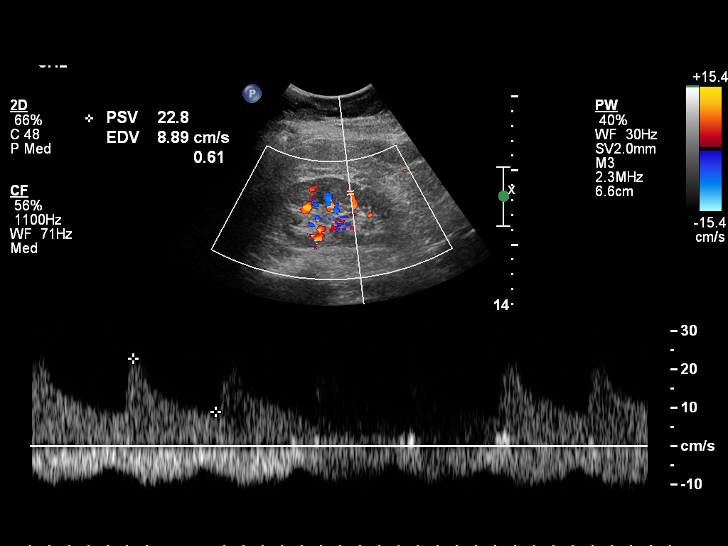
[im 22/58]
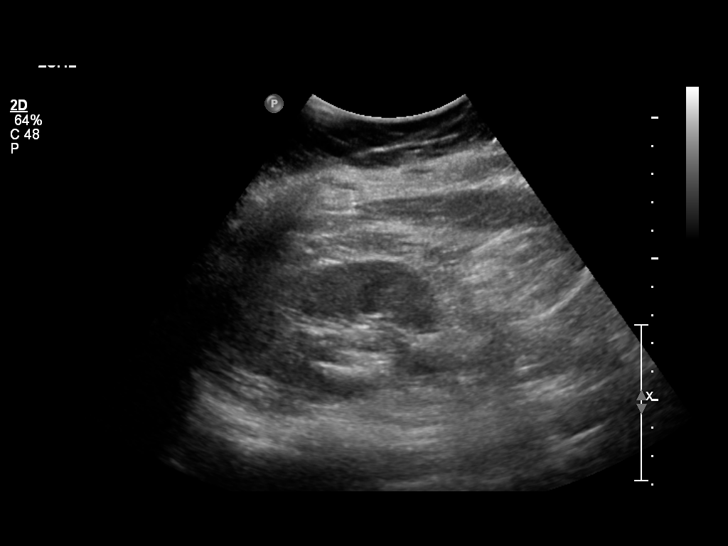
[im 27/58]
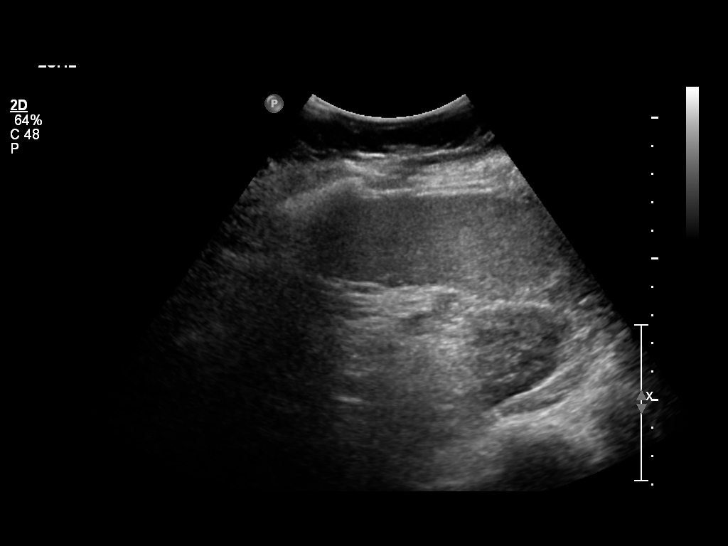
[im 31/58]
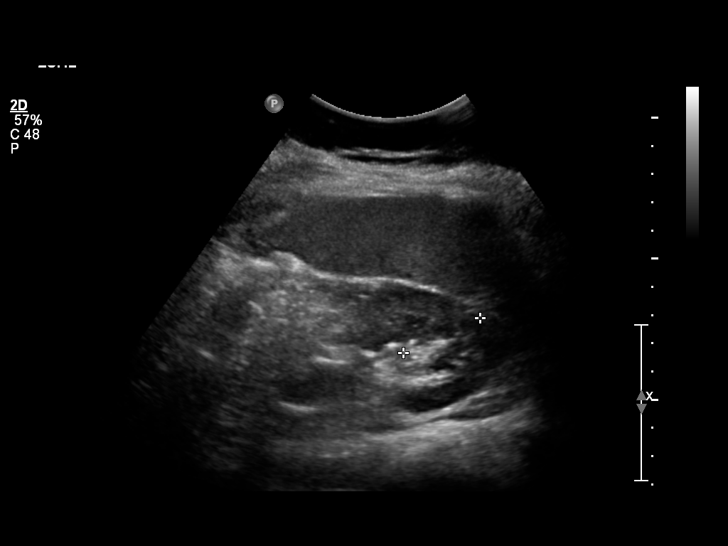
[im 36/58]
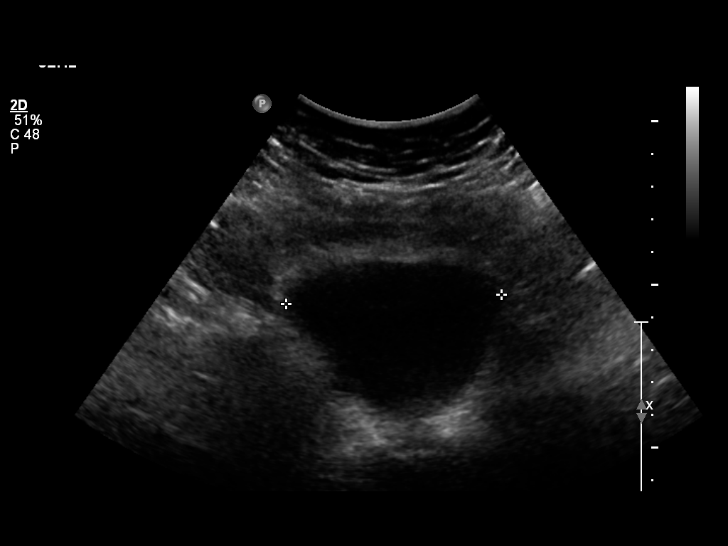
[im 39/58]
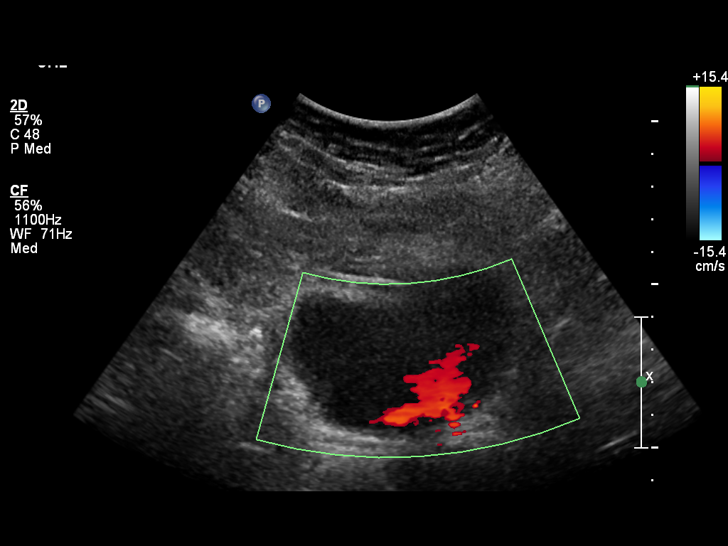
[im 43/58]
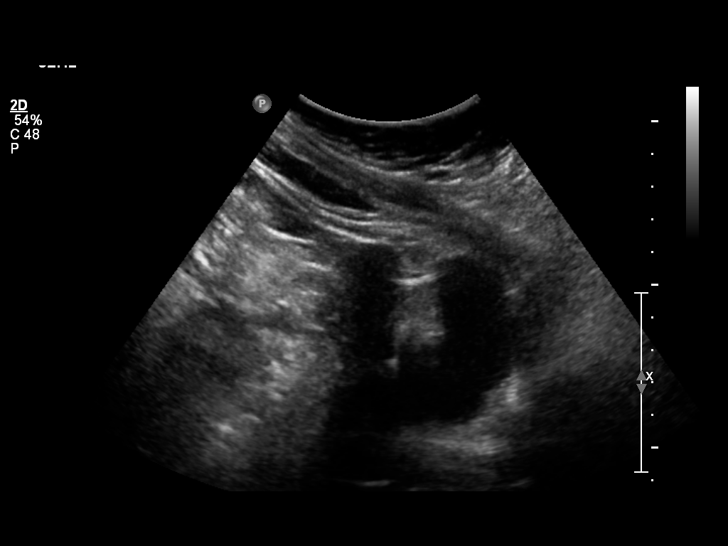
[im 48/58]
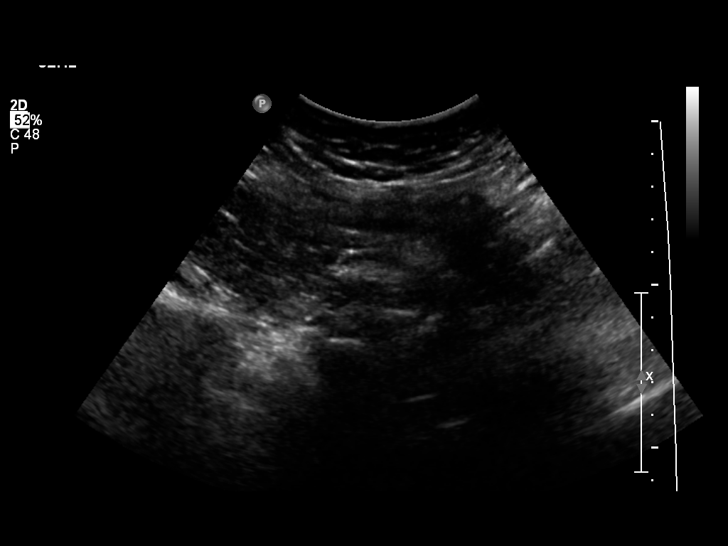
[im 53/58]
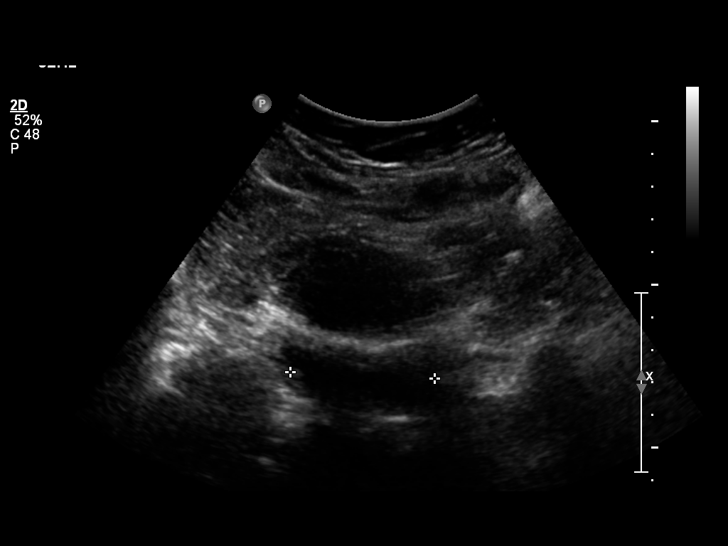
[im 58/58]
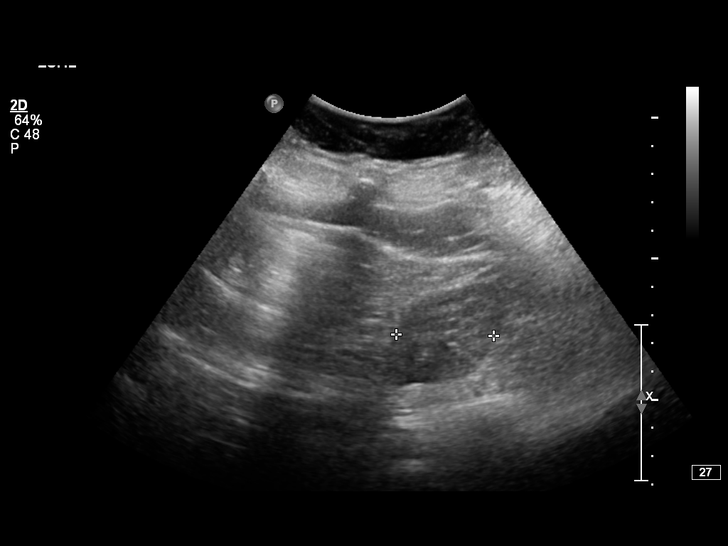

[14 of 25 positions shown; findings below may reference images not displayed]

FINDINGS: Right and left kidneys are 10.9 and 11.7 cm in length respectively. No solid renal mass or perinephric fluid. No stones are seen. Right-sided hydronephrosis is moderate.
IMPRESSION: 
IMPRESSION: Moderate right renal hydronephrosis. No solid mass seen

## 2021-09-04 IMAGING — CR XR CHEST 1 VIEW
1 series · 1 of 1 positions shown · non-contrast
Comparison: 05/01/2021.

FINAL REPORT:
EXAM: Portable chest x-ray
INDICATION: pain. . .
TECHNIQUE: Single, portable, AP digital radiograph of the chest.

[AP]
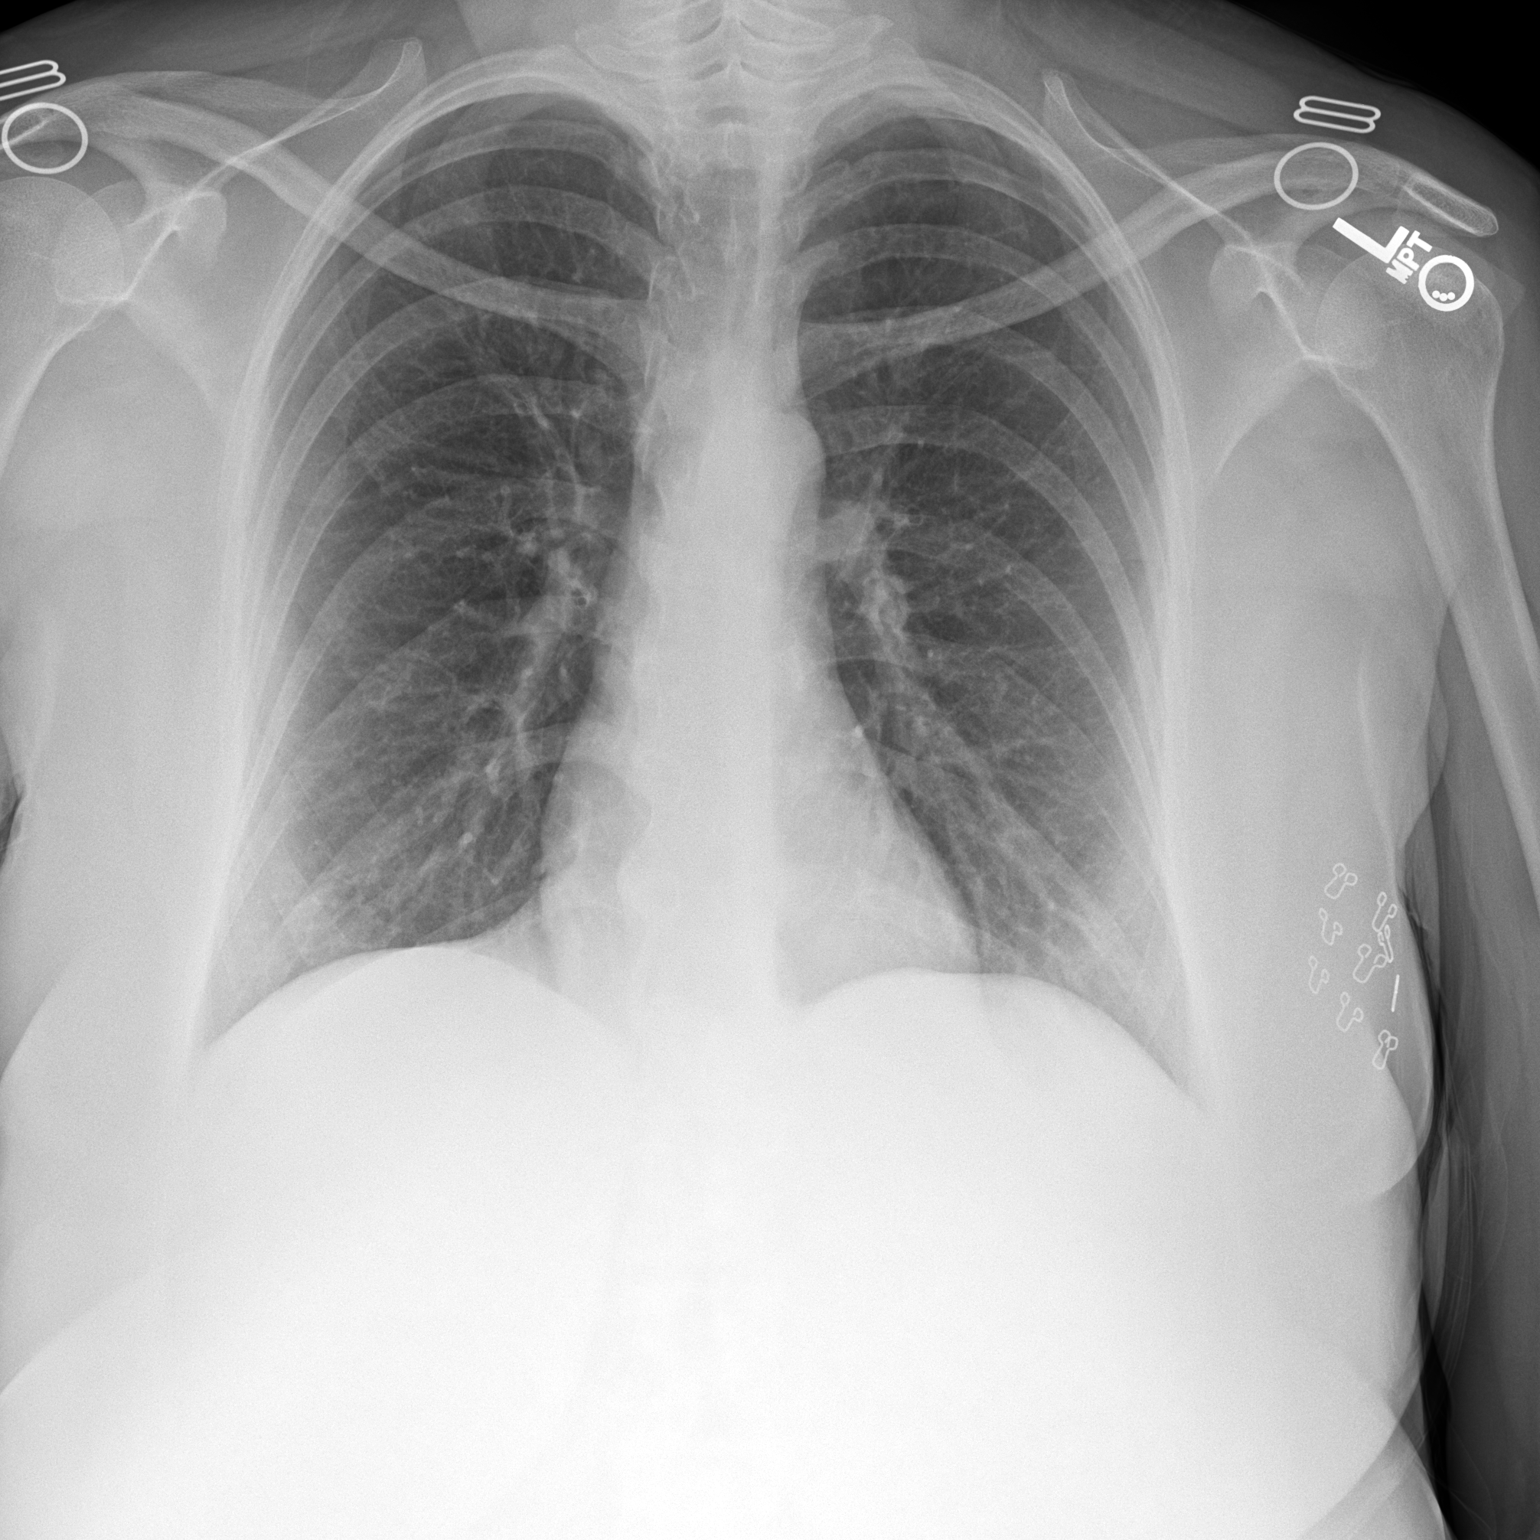

[1 of 1 positions shown; findings below may reference images not displayed]

FINDINGS: No focal consolidation, pleural effusion, or pneumothorax. Cardiomediastinal silhouette is unremarkable. No pulmonary edema pattern.
IMPRESSION: No features of an acute intrathoracic process.
Is the patient pregnant?
No

## 2021-09-05 IMAGING — CT CT CHEST PULMONARY EMBOLISM WITH IV CONTRAST
2 of 5 series · 13 of 36 positions shown · non-contrast
Comparison: No prior study is available for comparison.

FINAL REPORT:
CT angiogram of the chest. [DATE], 9890 [id number] hours
CLINICAL HISTORY: Pulmonary embolism (PE) suspected, positive D-dimer
TECHNIQUE: Helical axial sections with sagittal and coronal reformats of the chest were obtained with intravenous contrast. Iterative reconstruction technique was employed to reduce patient radiation exposure. 3D reconstructed images were also provided.
Contrast Dose: NA.
Radiation Dose: Total exam DLP 428.82 mGy/cm.

[Series 2: pe chest · axial · 0.75mm/px · z∈[-143,+107]mm · 10 of 124 slices shown]
[im 12/124  lung]
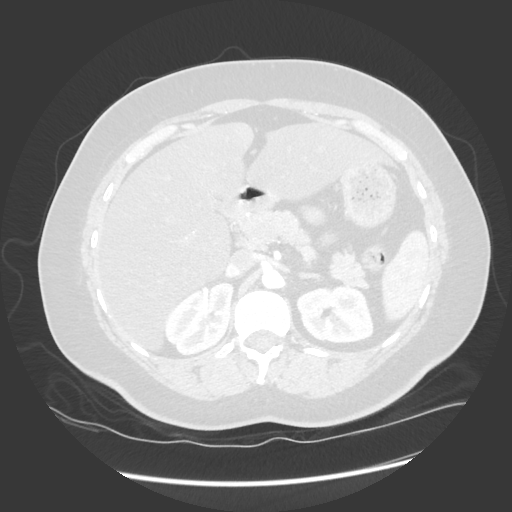
[im 23/124  mediastinal]
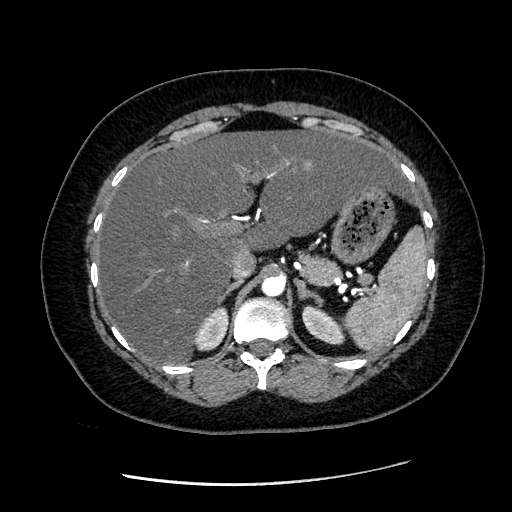
[im 34/124  lung]
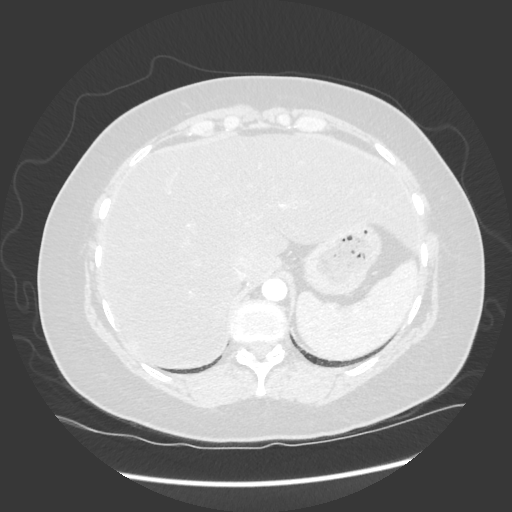
[im 45/124  mediastinal]
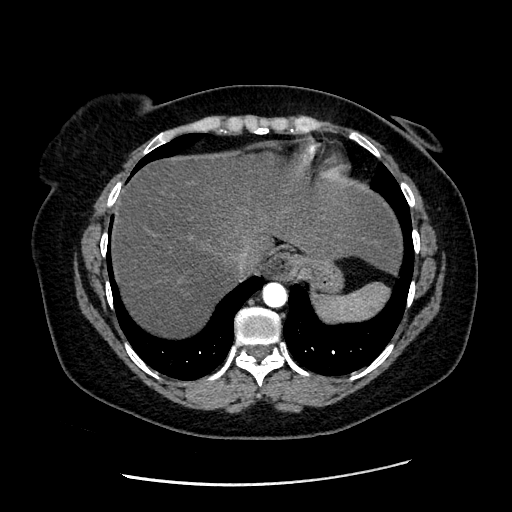
[im 56/124  lung]
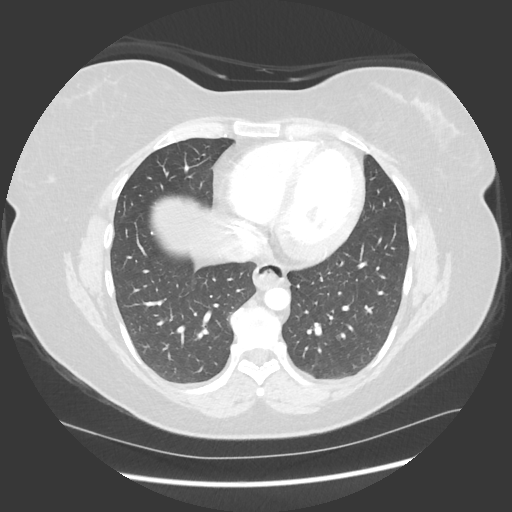
[im 68/124  mediastinal]
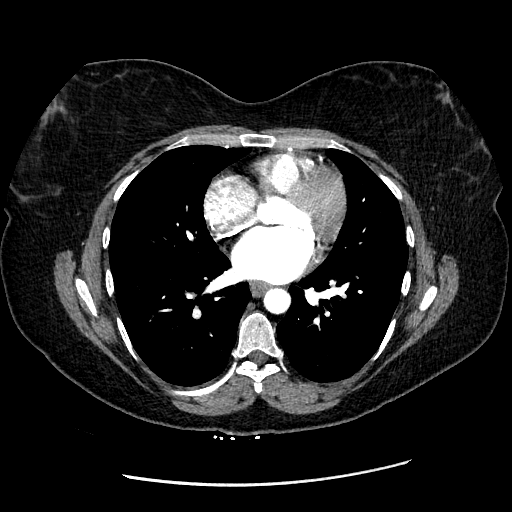
[im 79/124  lung]
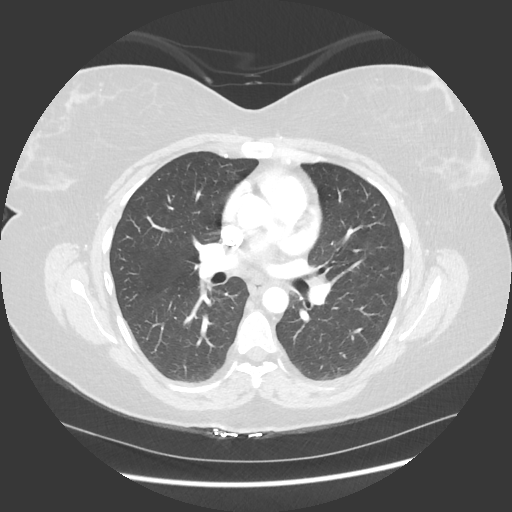
[im 90/124  mediastinal]
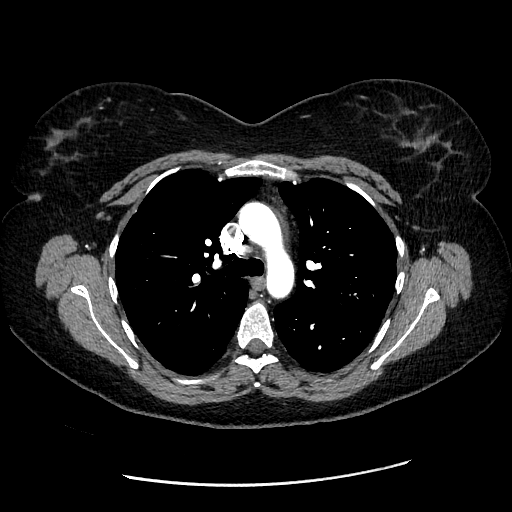
[im 101/124  lung]
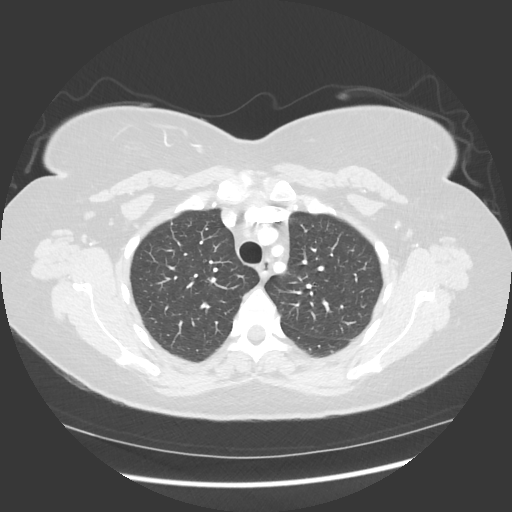
[im 112/124  mediastinal]
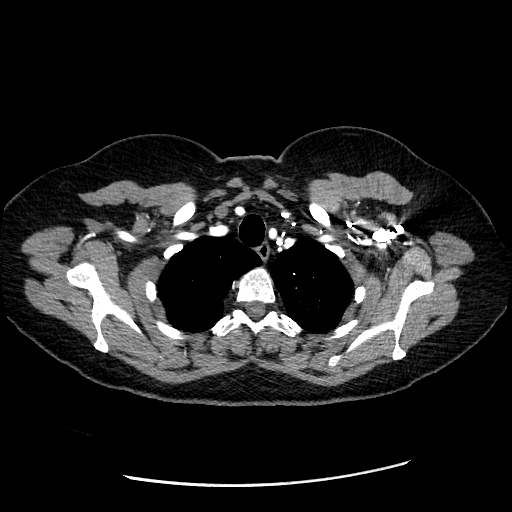

[Series 601: sag standard 2x2 · sagittal · 0.75mm/px · 3 of 193 slices shown]
[im 39/193  mediastinal]
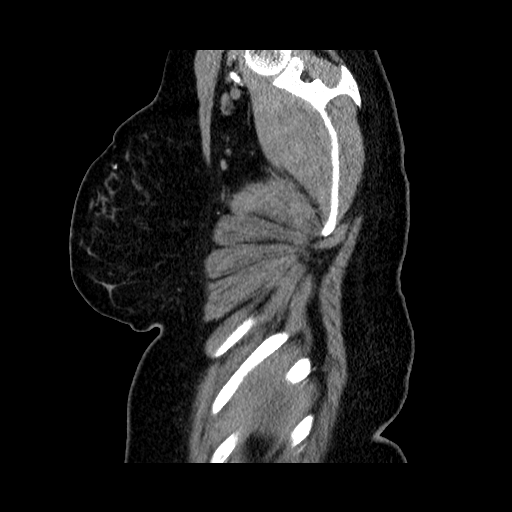
[im 77/193  mediastinal]
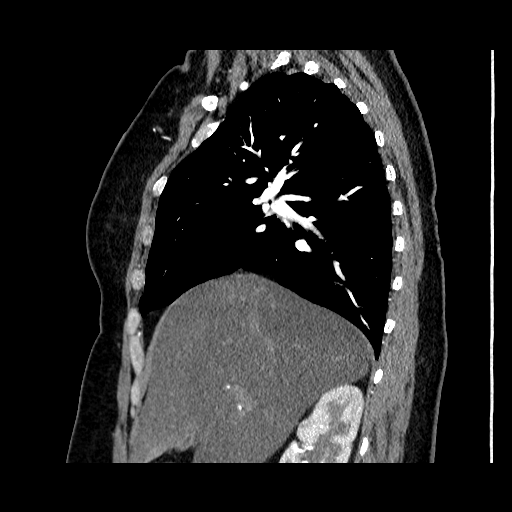
[im 116/193  mediastinal]
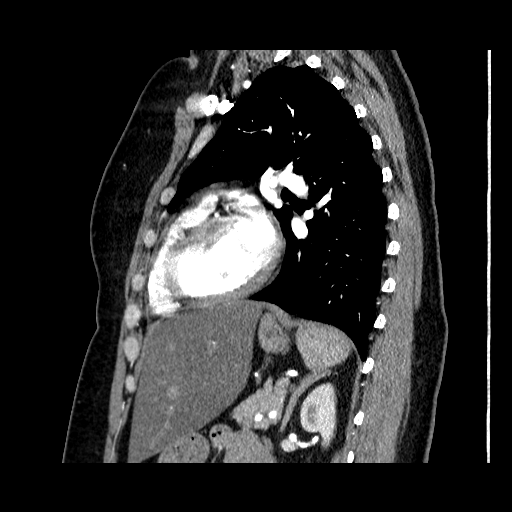

[13 of 36 positions shown; findings below may reference images not displayed]

FINDINGS: There is no filling defect within the pulmonary artery divisions to suggest pulmonary thromboembolism. The mediastinum demonstrates no evidence of mass or lymphadenopathy. The thoracic aorta is unremarkable. There is no pericardial effusion. A small hiatal hernia is present.
Bibasilar atelectasis is seen. No evidence of pleural effusion or pneumothorax.
The osseous structures are unremarkable.
Fatty infiltration of the liver is noted. There is a 1 cm hyperdensity in the liver, which may represent a hemangioma.
The other visualized upper abdominal viscera are unremarkable.
IMPRESSION: 
IMPRESSION: No CT evidence of pulmonary thromboembolism or other acute intrathoracic pathology.
Other findings as described above.
Is the patient pregnant?
No

## 2021-10-12 IMAGING — DX XR CHEST 1 VIEW
1 series · 1 of 1 positions shown · non-contrast
Comparison: Chest radiograph September 04, 2021. CTA chest September 05, 2021.

FINAL REPORT:
INDICATION: CHEST PAIN

[AP]
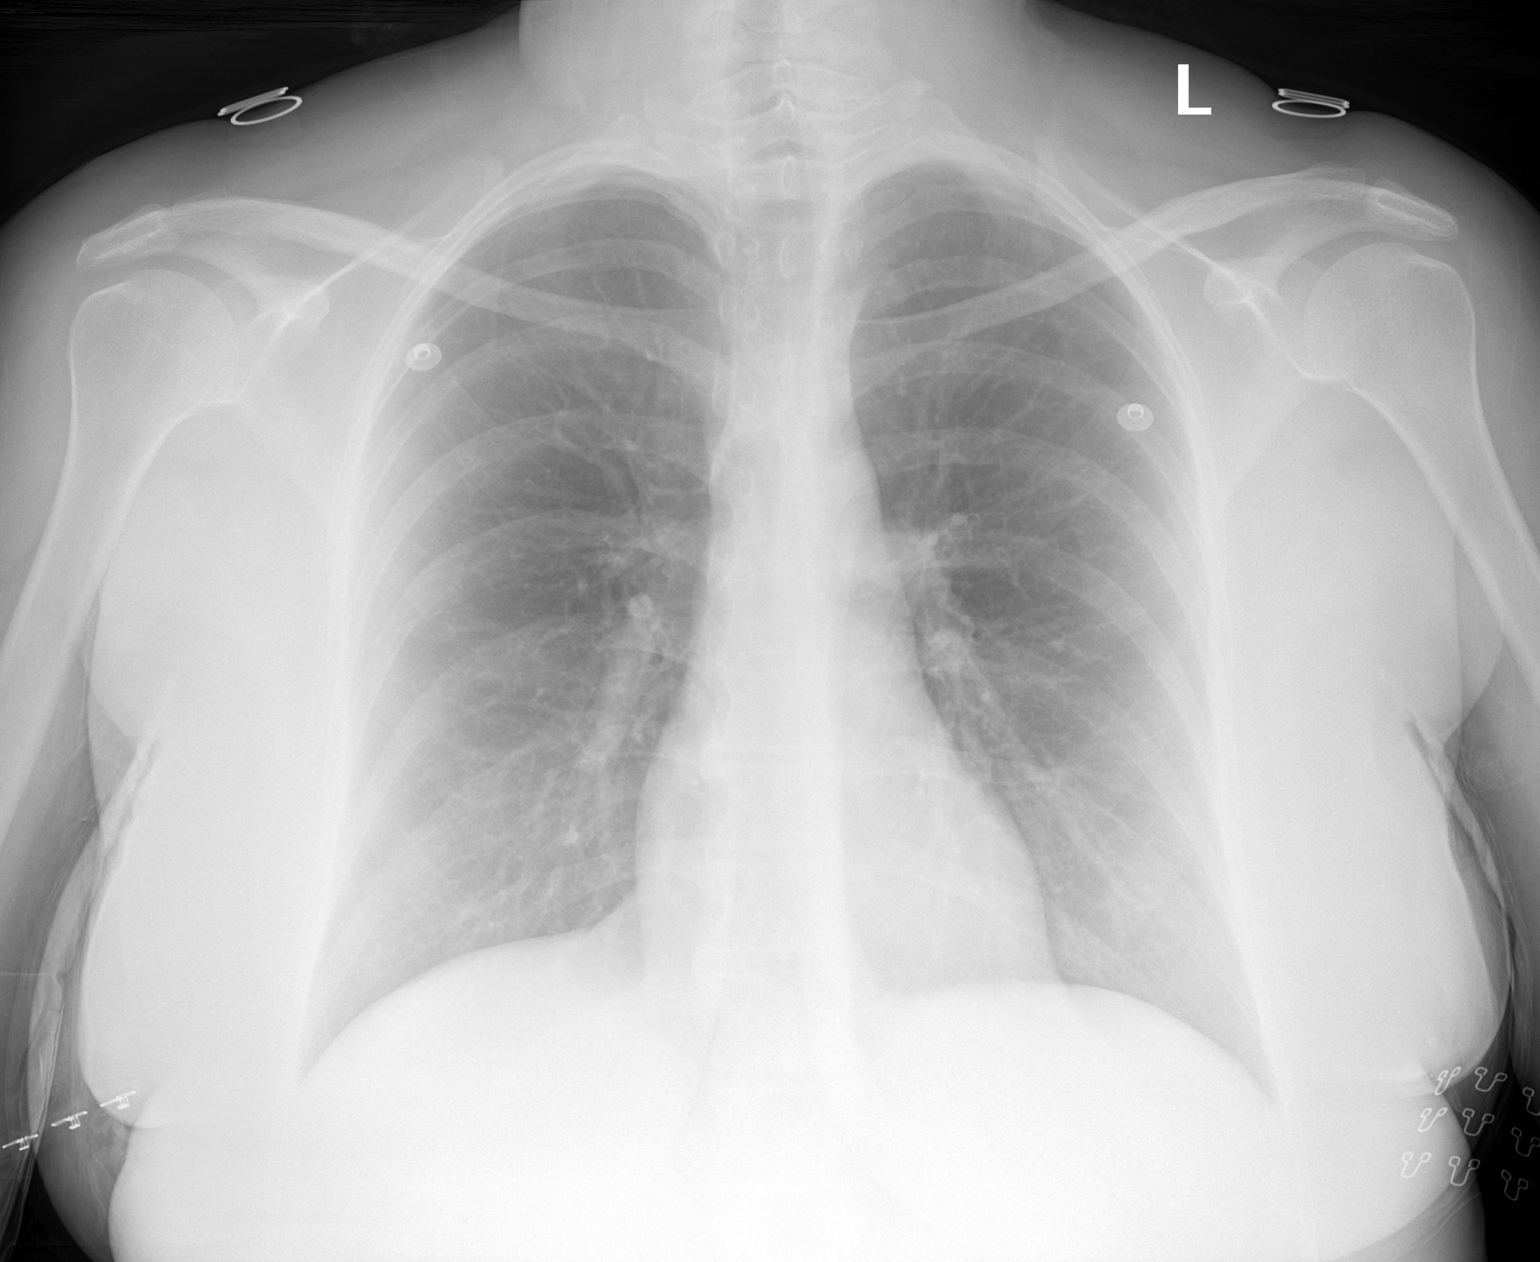

[1 of 1 positions shown; findings below may reference images not displayed]

FINDINGS: 1 view of the chest.
There is no consolidation, pleural effusion, or pneumothorax. The cardiac silhouette is within normal limits. Visualized osseous structures are intact
IMPRESSION: 
IMPRESSION: No acute cardiopulmonary abnormality.
Is the patient pregnant?
No

## 2022-01-22 IMAGING — CT CT ABDOMEN PELVIS WITH CONTRAST
2 of 3 series · 17 of 46 positions shown, 19 images · non-contrast
Comparison: 06/30/2021
Axial spiral CT acquisition was performed from the lung bases through the pubic symphysis following IV contrast.

Abdomen and chest pain
Hx of appendectomy, cardiac cath,
FINAL REPORT:
CT abdomen and pelvis:
CLINICAL INDICATION: Abdominal pain

[Series 3: abd/pel · axial · 0.90mm/px · z∈[-623,-153]mm · 14 of 216 slices shown, 16 images]
[im 14/216  soft-tissue]
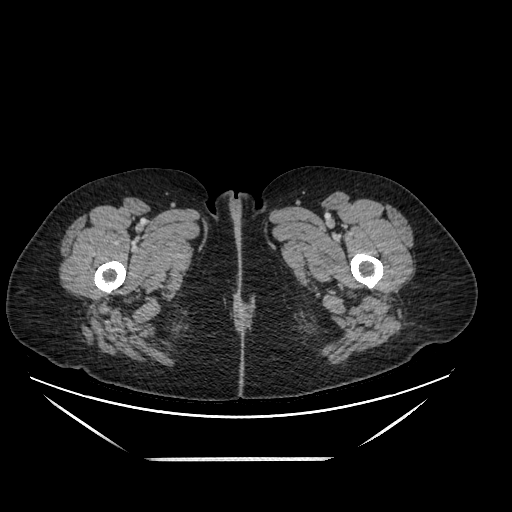
[im 14/216  bone]
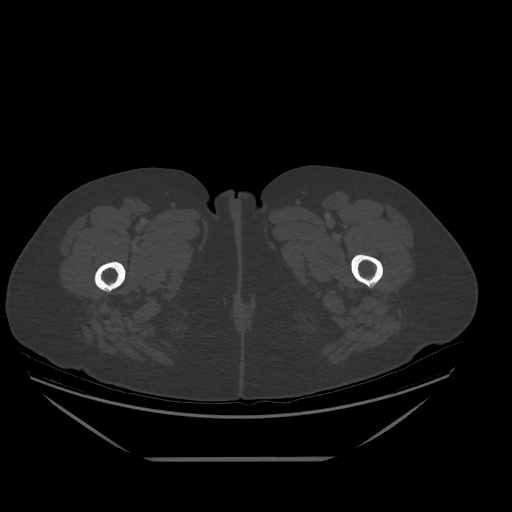
[im 28/216  soft-tissue]
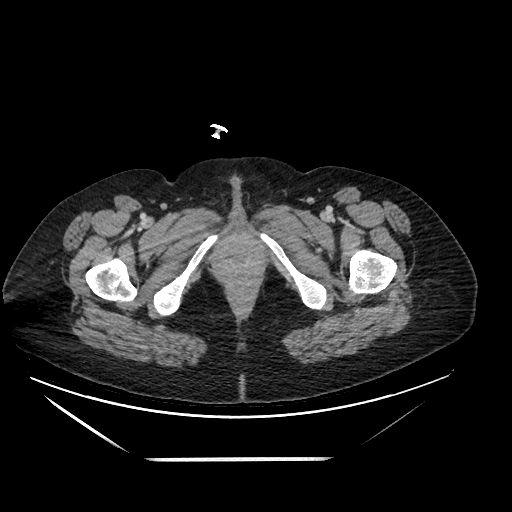
[im 42/216  soft-tissue]
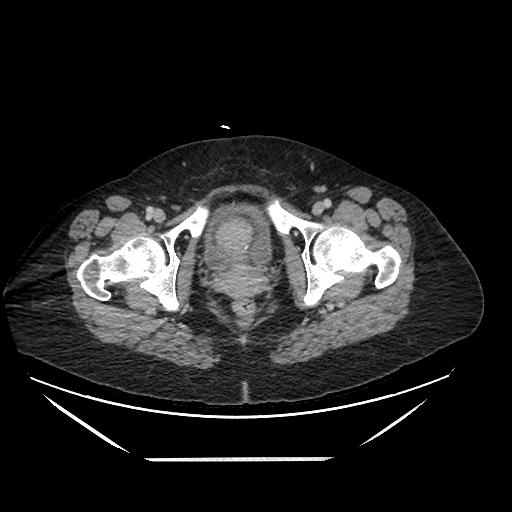
[im 56/216  soft-tissue]
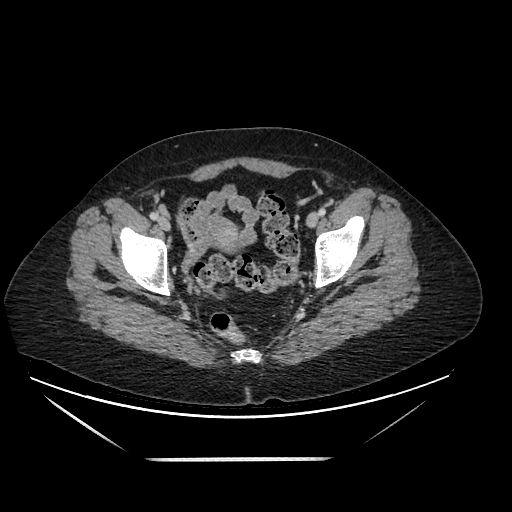
[im 70/216  soft-tissue]
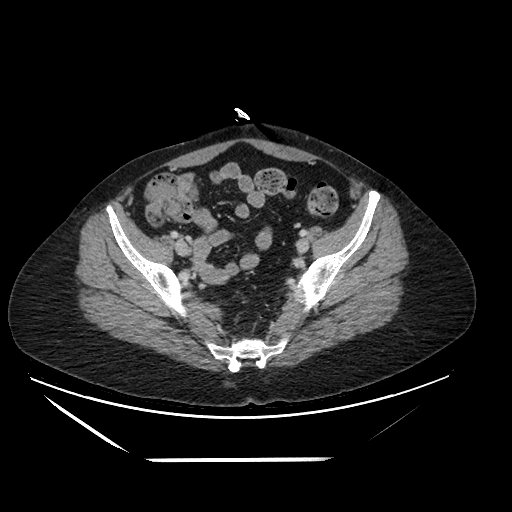
[im 84/216  soft-tissue]
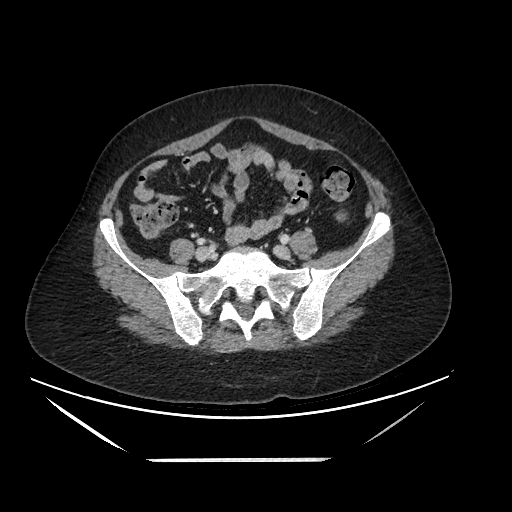
[im 98/216  soft-tissue]
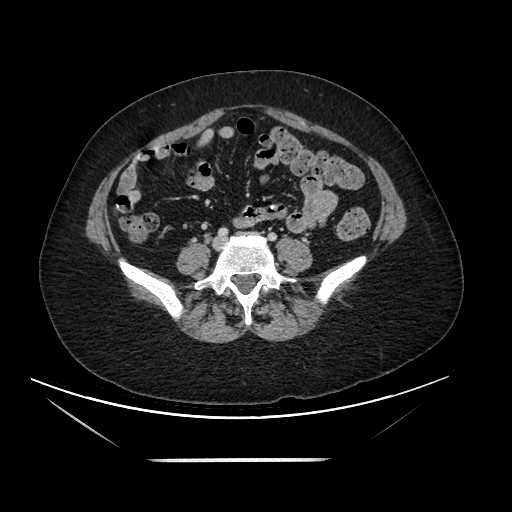
[im 118/216  soft-tissue]
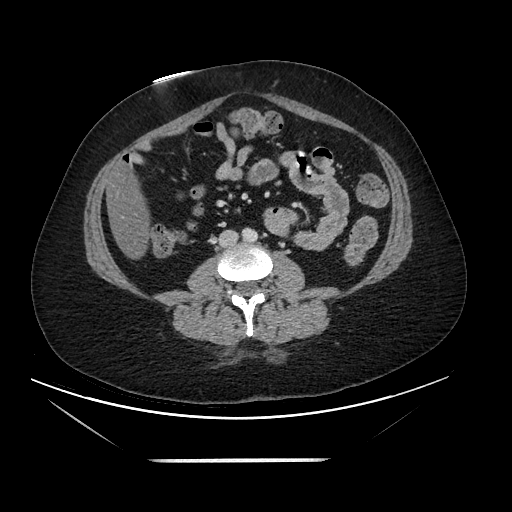
[im 132/216  soft-tissue]
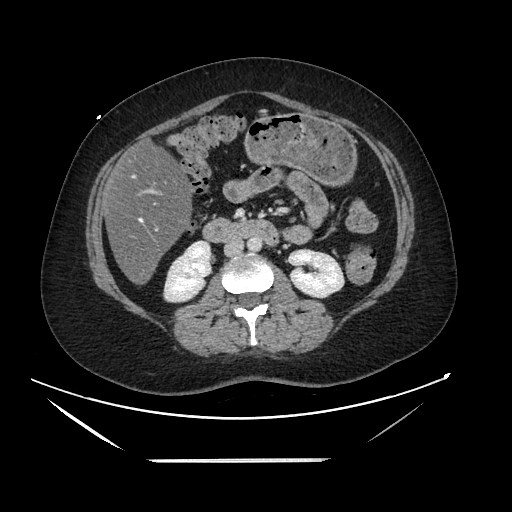
[im 132/216  bone]
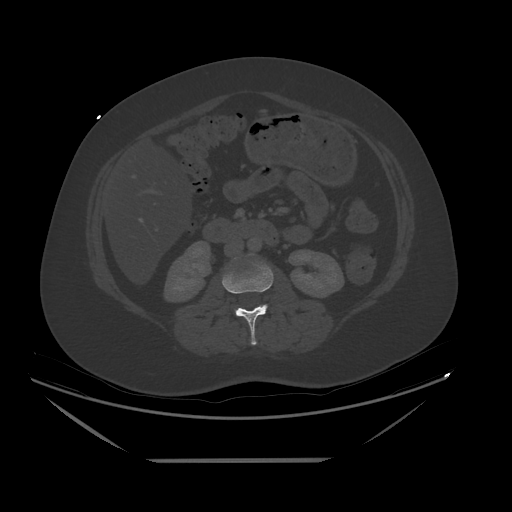
[im 146/216  soft-tissue]
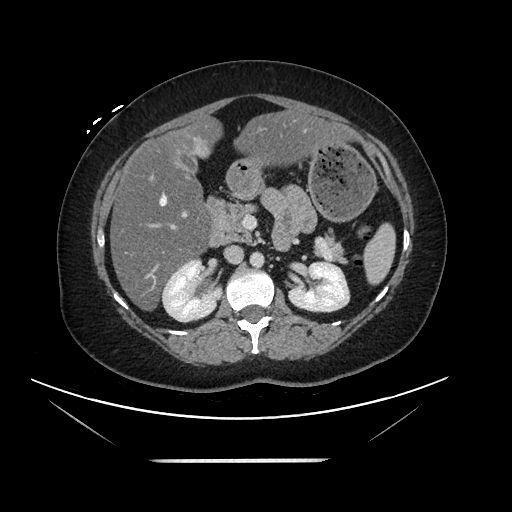
[im 160/216  soft-tissue]
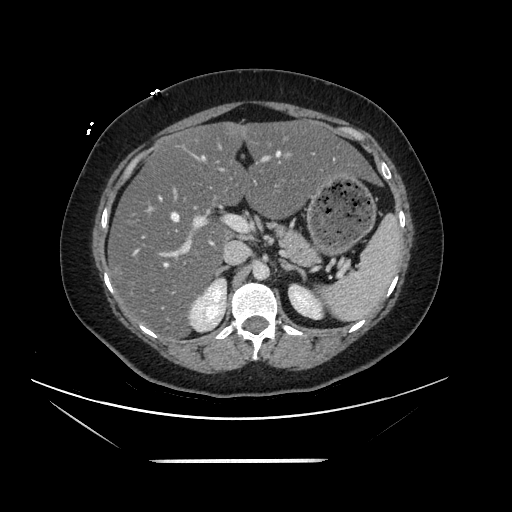
[im 174/216  soft-tissue]
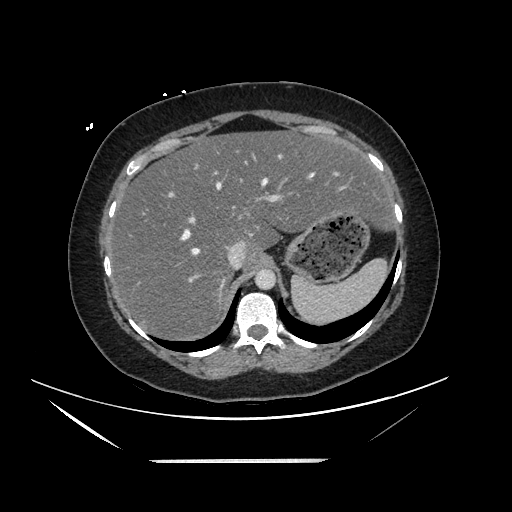
[im 188/216  soft-tissue]
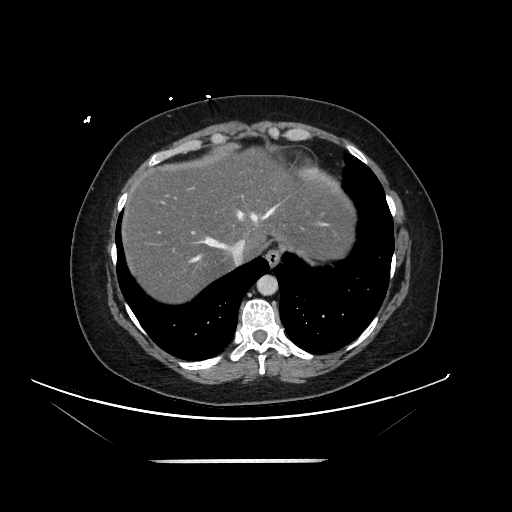
[im 202/216  soft-tissue]
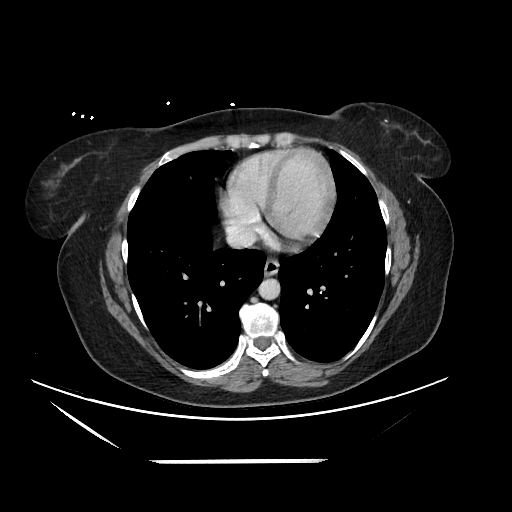

[Series 607: sag standard 2x2 · sagittal · 1.05mm/px · 3 of 219 slices shown]
[im 73/219  soft-tissue]
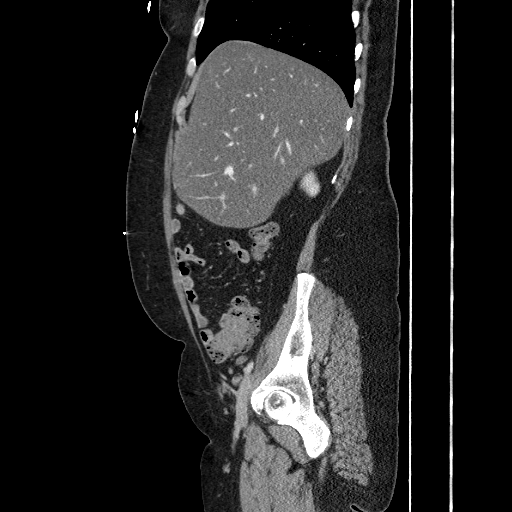
[im 97/219  soft-tissue]
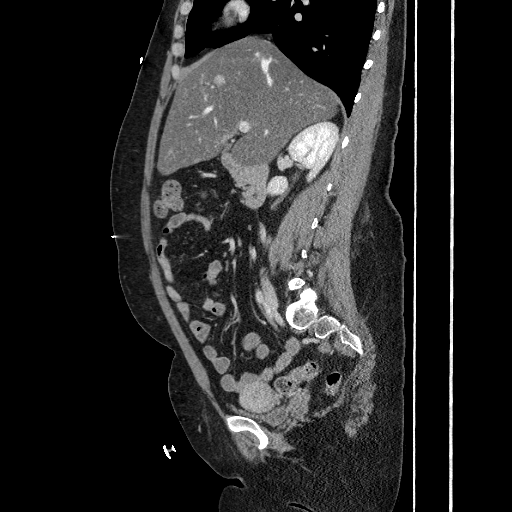
[im 122/219  soft-tissue]
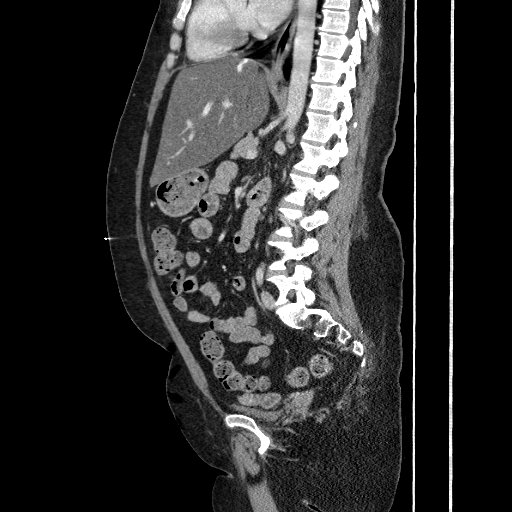

[17 of 46 positions shown; findings below may reference images not displayed]

All CT scans at this facility use iterative reconstruction technique, dose modulation and/or weight based dosing when appropriate to reduce radiation dose to as low as reasonably achievable.
There is diffuse hepatic fatty infiltration. Round nodules are demonstrated in the left hepatic lobe measuring up to 11 mm. These are unchanged from previous examination. This could represent areas of focal sparing. The spleen, gallbladder, adrenal glands, and pancreas appear unremarkable. The kidneys show symmetric contrast excretion. There is no hydronephrosis. No retroperitoneal adenopathy is seen. The vascular structures appear unremarkable. There is no free intraperitoneal air or bowel wall thickening. The uterus and adnexal structures appear unremarkable. The urinary bladder is poorly distended. No pelvic or inguinal adenopathy is seen. Skeletal structures appear intact.
IMPRESSION: No acute process in the abdomen or pelvis.
Diffuse hepatic fatty infiltration.
Round nodules in the left hepatic lobe could represent areas of focal fatty sparing. These are unchanged from previous examination. These could be further characterized with MRI.

## 2022-01-22 IMAGING — DX XR CHEST 1 VIEW
1 series · 1 of 1 positions shown · non-contrast
Comparison: 10/12/2021
There is no acute parenchymal consolidation.

FINAL REPORT:
Chest AP portable:
CLINICAL INDICATION: Chest pain and shortness of breath

[AP]
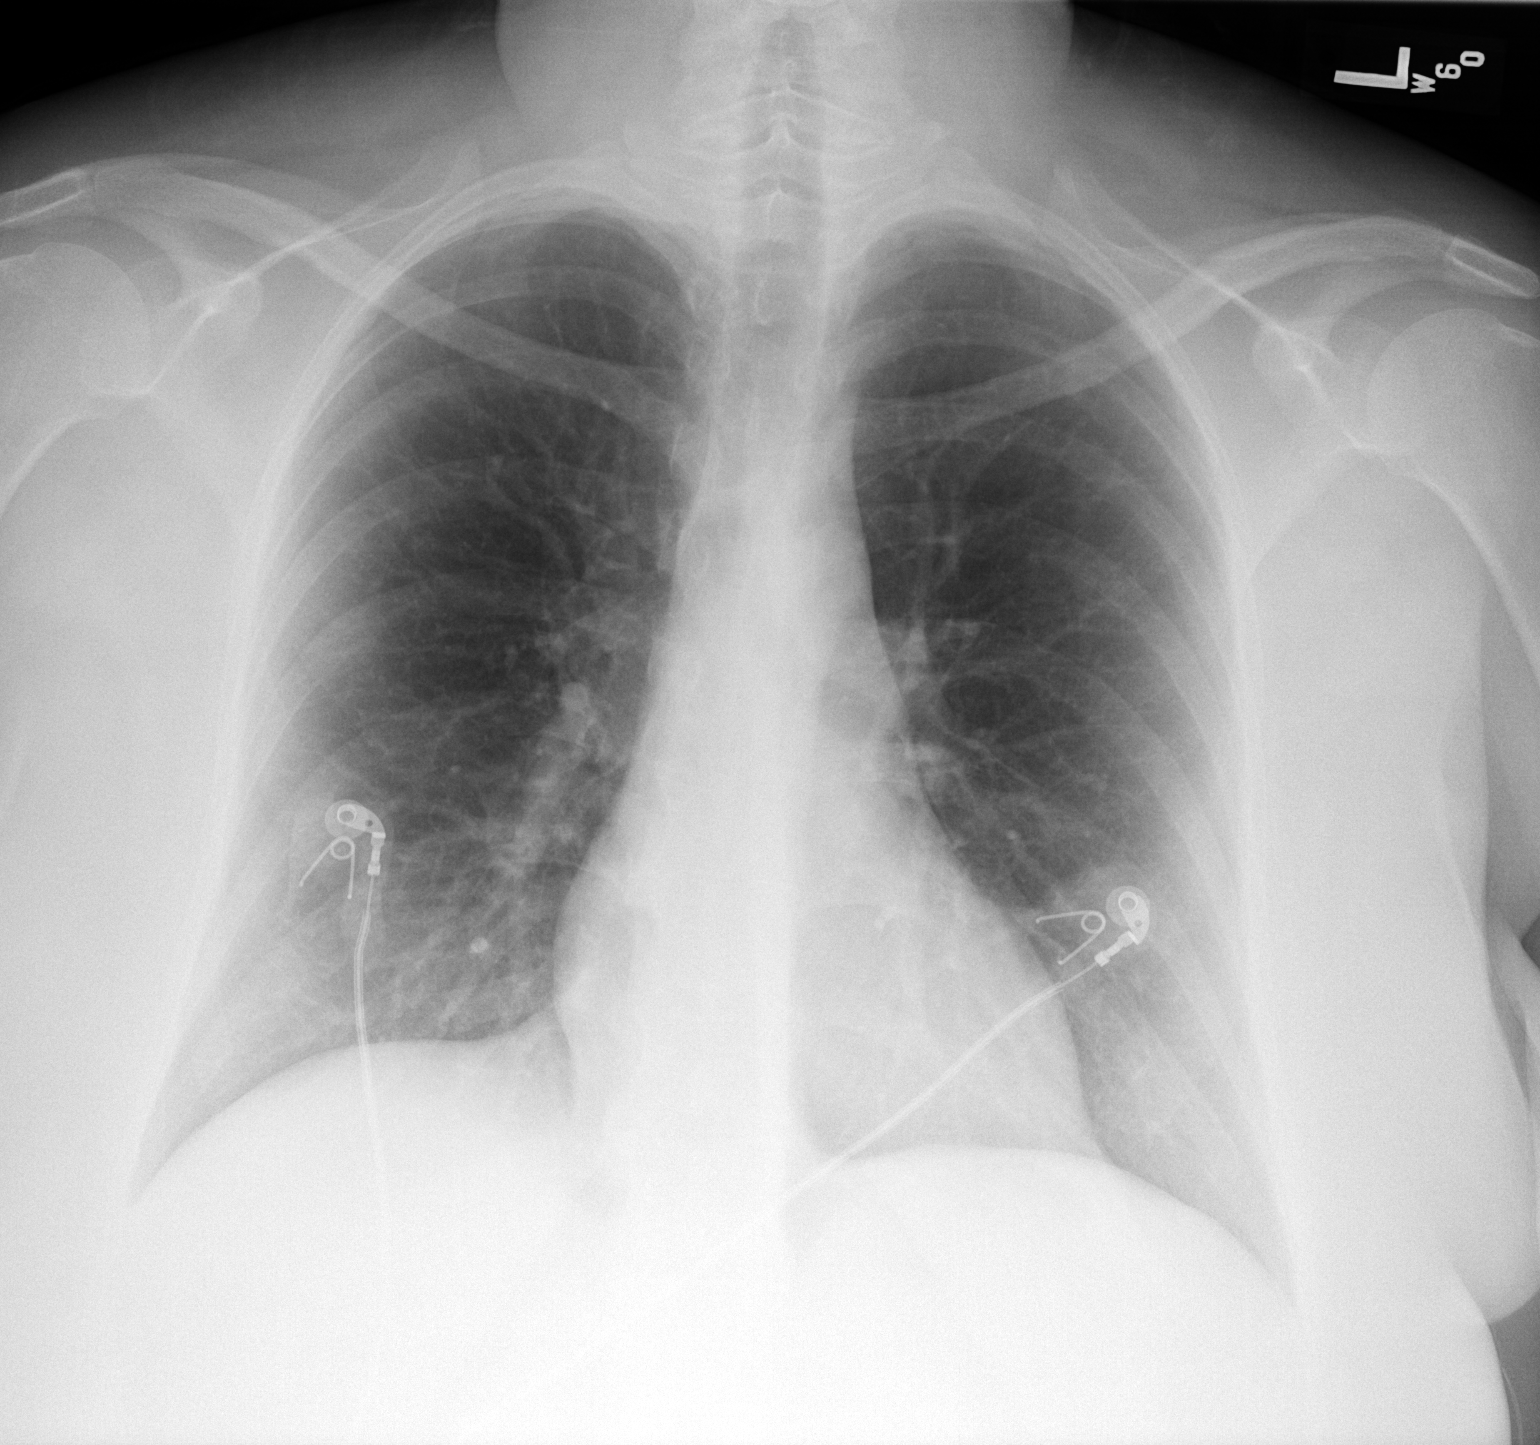

[1 of 1 positions shown; findings below may reference images not displayed]

No vascular congestion, pneumothorax or gross effusion is seen. Heart appears of normal size. The skeletal structures appear intact.
IMPRESSION: No acute intrathoracic process. No significant change.
Is the patient pregnant?
No

## 2022-01-22 IMAGING — CT CT CHEST PULMONARY EMBOLISM WITH IV CONTRAST
2 of 5 series · 13 of 36 positions shown · non-contrast
Comparison: None
Axial spiral CT acquisition was performed from the base of the neck through the upper abdomen following intravenous contrast.

Abdomen and chest pain
FINAL REPORT:
CT arteriogram chest:
CLINICAL INDICATION: Chest pain,

[Series 2: pe chest · axial · 0.79mm/px · z∈[-243,+19]mm · 10 of 129 slices shown]
[im 12/129  lung]
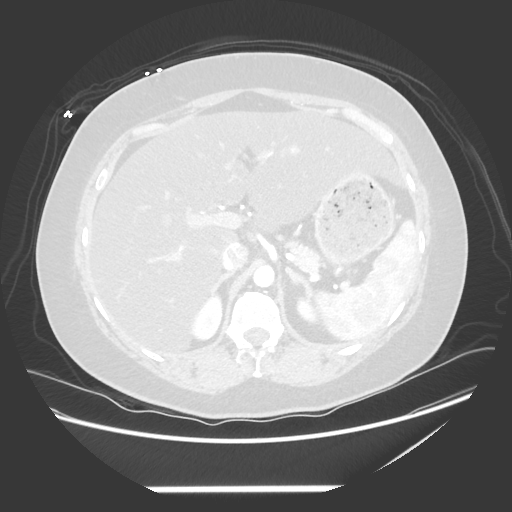
[im 24/129  mediastinal]
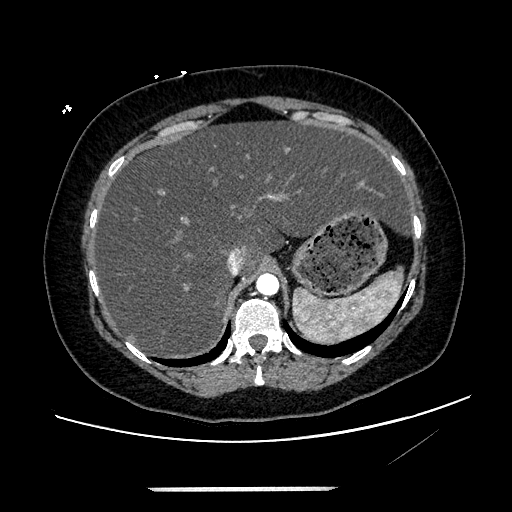
[im 35/129  lung]
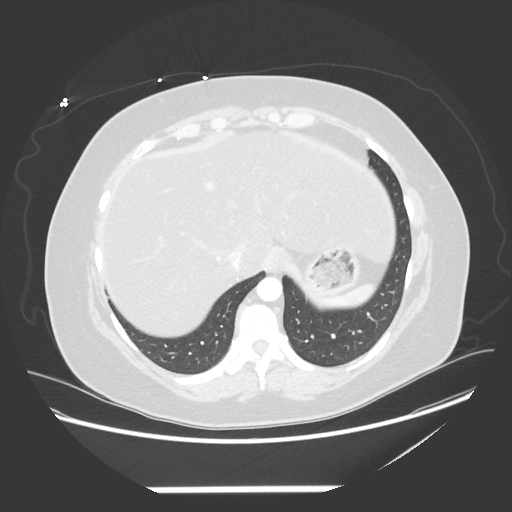
[im 47/129  mediastinal]
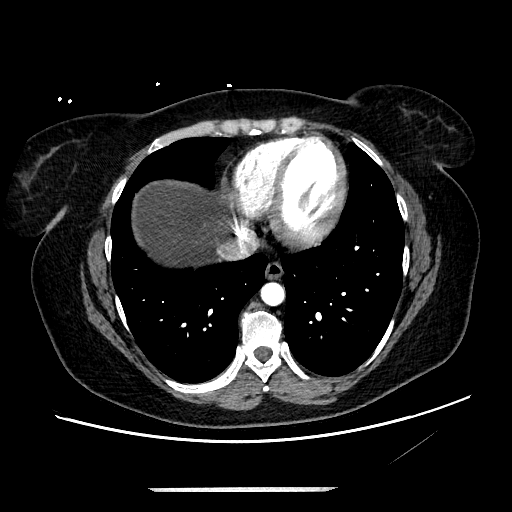
[im 59/129  lung]
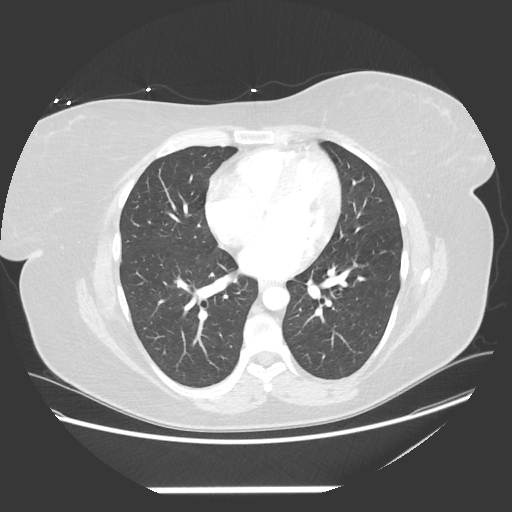
[im 70/129  mediastinal]
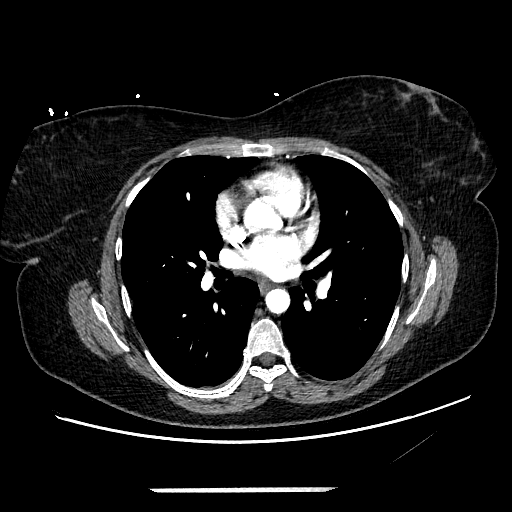
[im 82/129  lung]
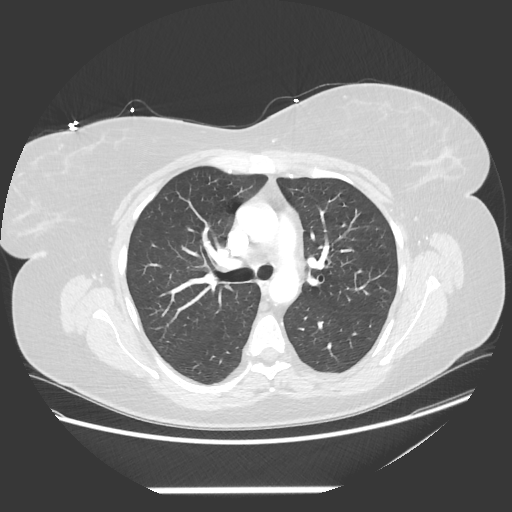
[im 94/129  mediastinal]
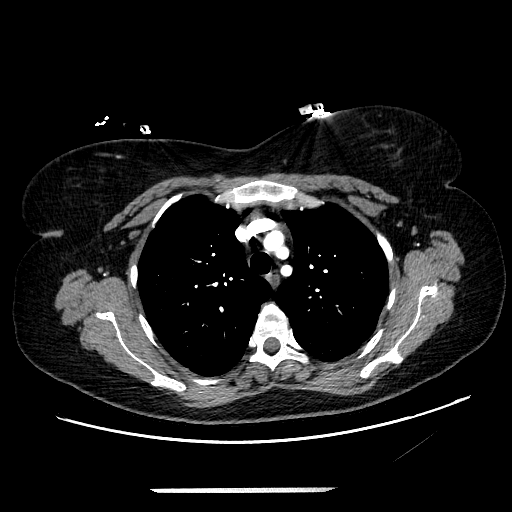
[im 105/129  lung]
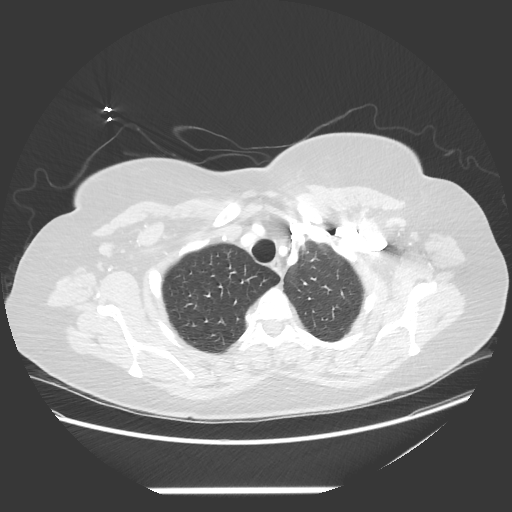
[im 117/129  mediastinal]
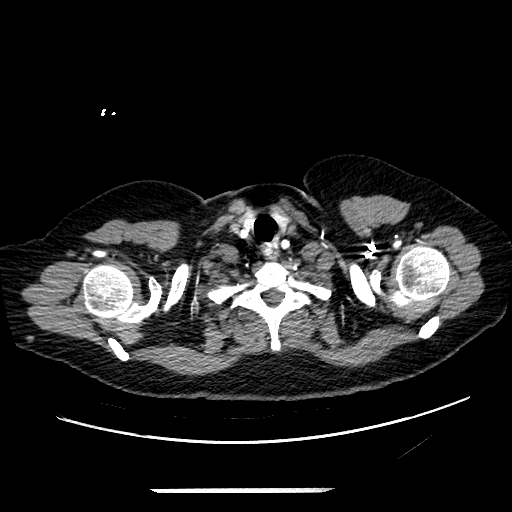

[Series 602: sag standard 2x2 · sagittal · 0.79mm/px · 3 of 205 slices shown]
[im 41/205  mediastinal]
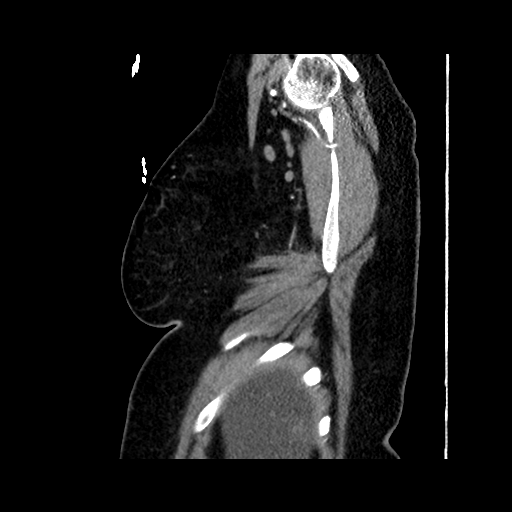
[im 82/205  mediastinal]
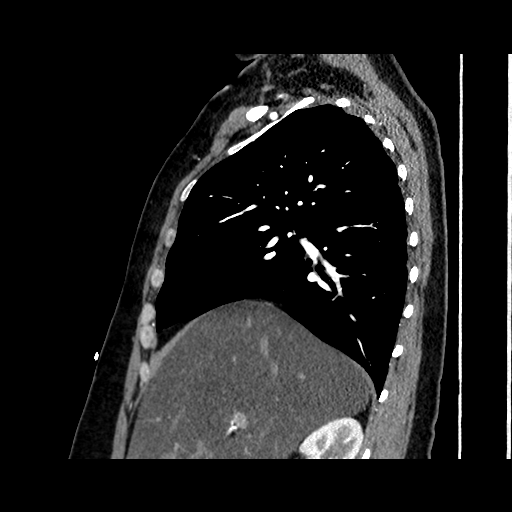
[im 123/205  mediastinal]
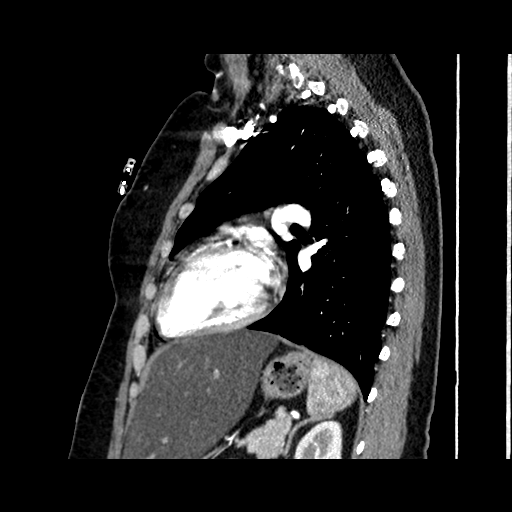

[13 of 36 positions shown; findings below may reference images not displayed]

Multiplanar reconstructions are provided. MIP reconstructions were generated and reviewed.
All CT scans at this facility use iterative reconstruction technique, dose modulation and/or weight based dosing when appropriate to reduce radiation dose to as low as reasonably achievable.
There is no pulmonary arterial filling defect appreciated to indicate a pulmonary embolism. There is no aortic aneurysm or dissection appreciated.
No chest wall mass or axillary lymphadenopathy is seen. The heart appears normal size. No mediastinal adenopathy is appreciated. The central airways and esophagus appear within normal limits.
The lungs are well expanded without acute parenchymal consolidation or focal soft tissue mass or nodule. No pneumothorax or effusion is seen.
IMPRESSION: No pulmonary embolism or other acute intrathoracic process.
Is the patient pregnant?
Unknown

## 2022-05-13 IMAGING — CT CT ABDOMEN PELVIS WITHOUT CONTRAST
2 of 3 series · 13 of 32 positions shown, 19 images · non-contrast
Comparison: 01/22/2022.

Left flank pain; hx appendectomy, cardiac sx
FINAL REPORT:
INDICATION: Abdominal pain, acute, nonlocalized
Flank pain, kidney stone suspected
EXAM: CT of the abdomen and pelvis without IV Contrast
Submitted images:
All CT scans at this facility use iterative reconstruction technique, dose modulation and/or weight based dosing when appropriate to reduce radiation dose to as low as reasonably achievable.

[Series 2: renal stone · axial · 0.75mm/px · z∈[-389,-11]mm · 9 of 189 slices shown, 15 images]
[im 19/189  soft-tissue]
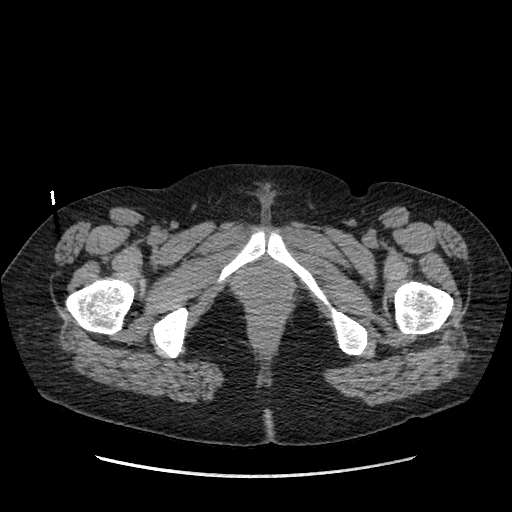
[im 19/189  bone]
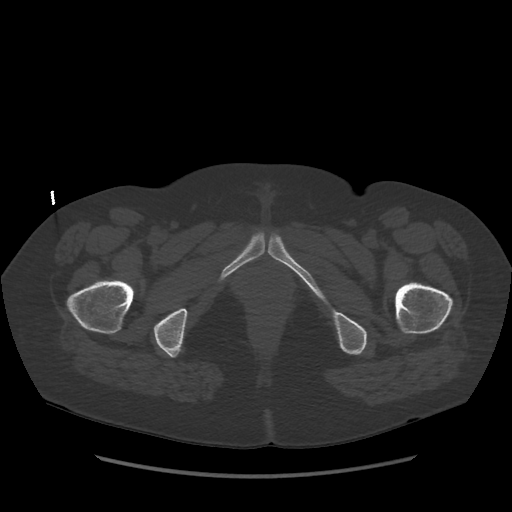
[im 38/189  soft-tissue]
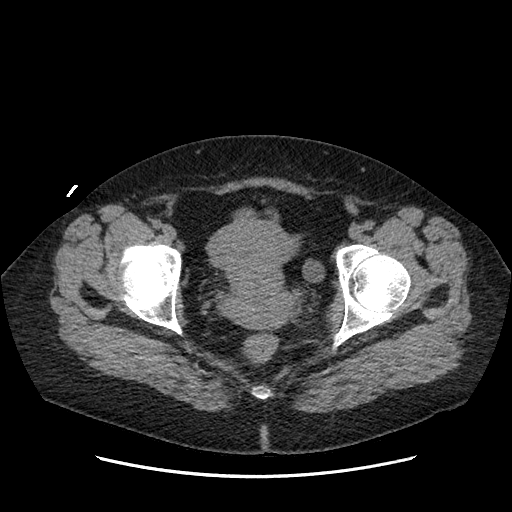
[im 57/189  soft-tissue]
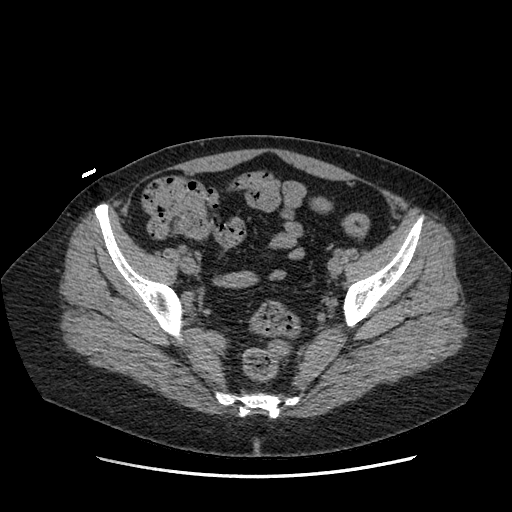
[im 76/189  soft-tissue]
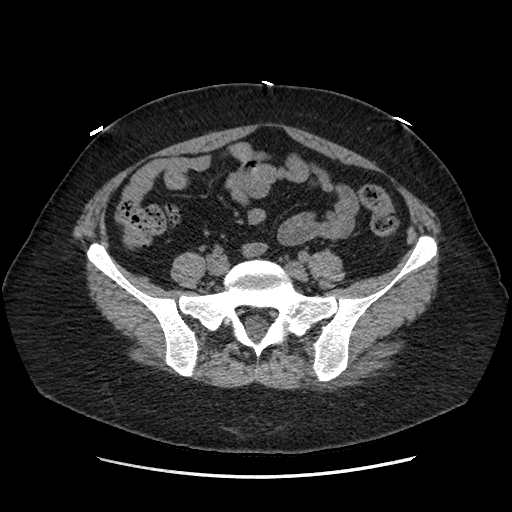
[im 95/189  soft-tissue]
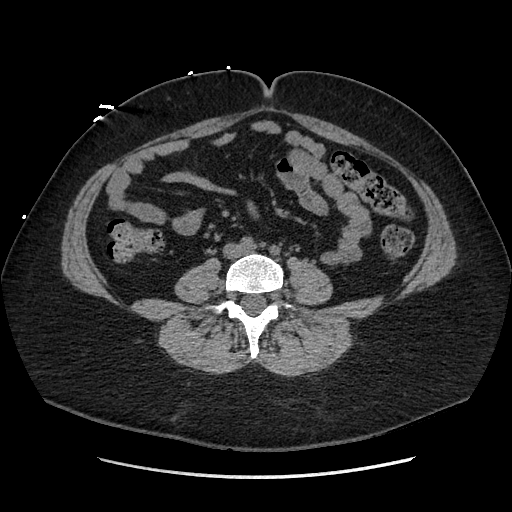
[im 113/189  soft-tissue]
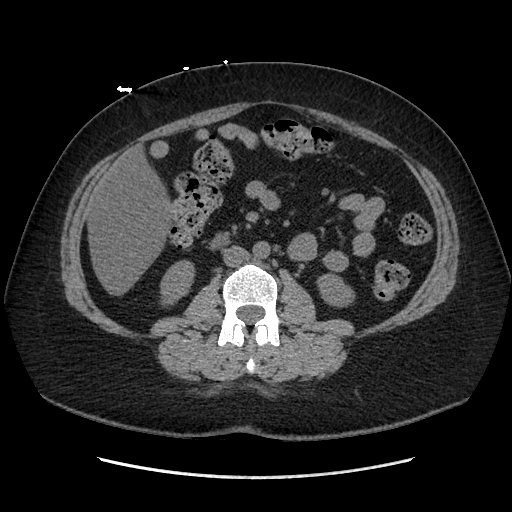
[im 113/189  lung]
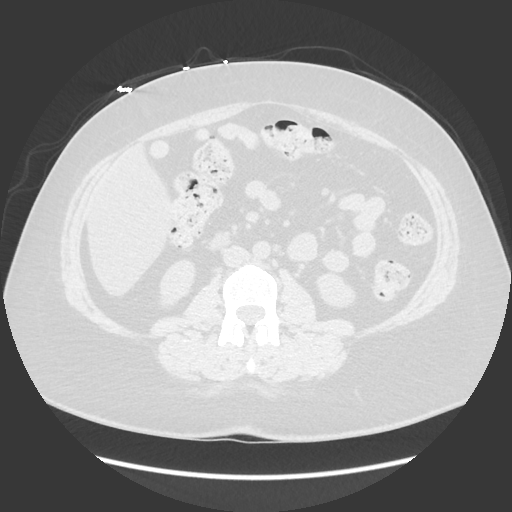
[im 132/189  soft-tissue]
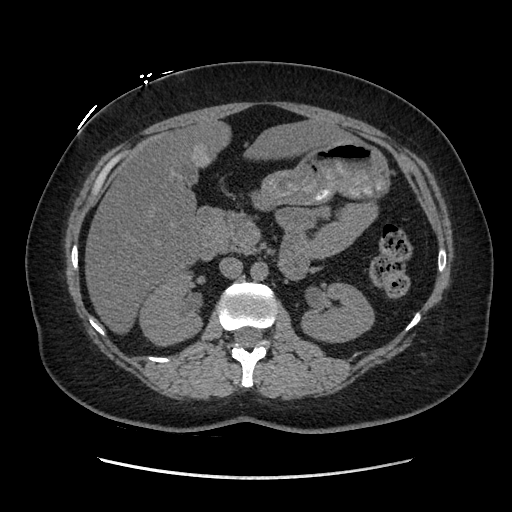
[im 132/189  lung]
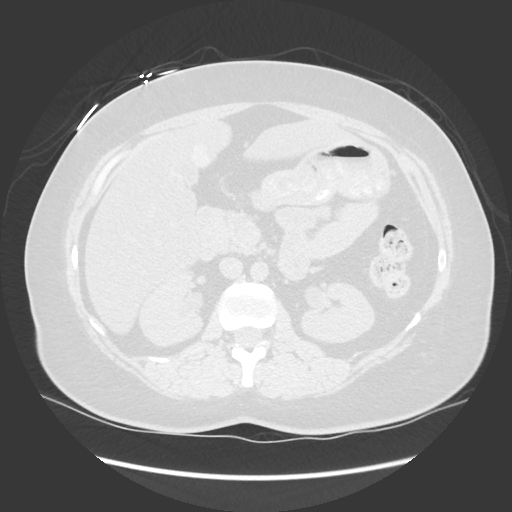
[im 151/189  soft-tissue]
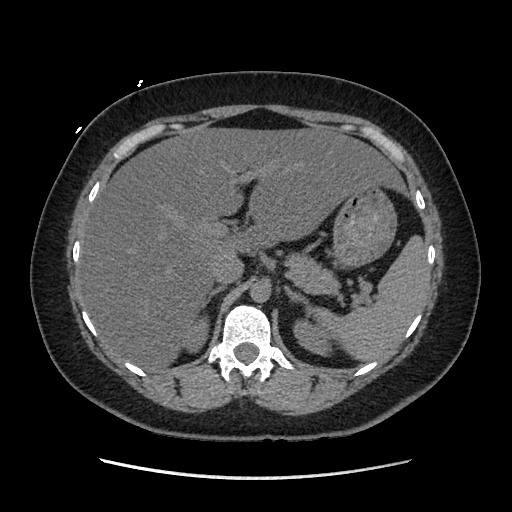
[im 151/189  lung]
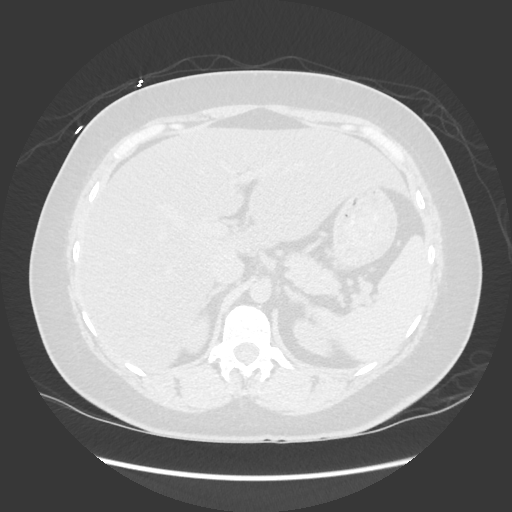
[im 170/189  soft-tissue]
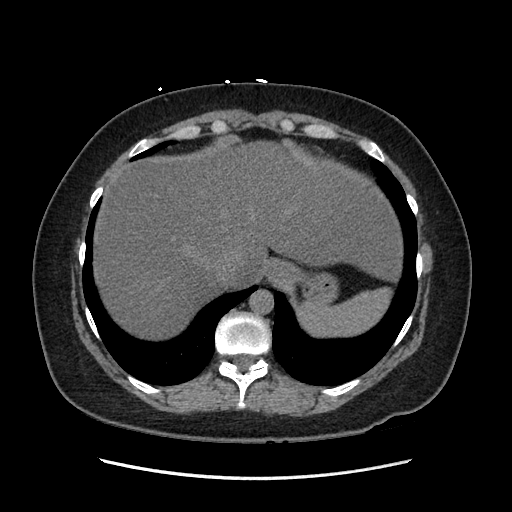
[im 170/189  lung]
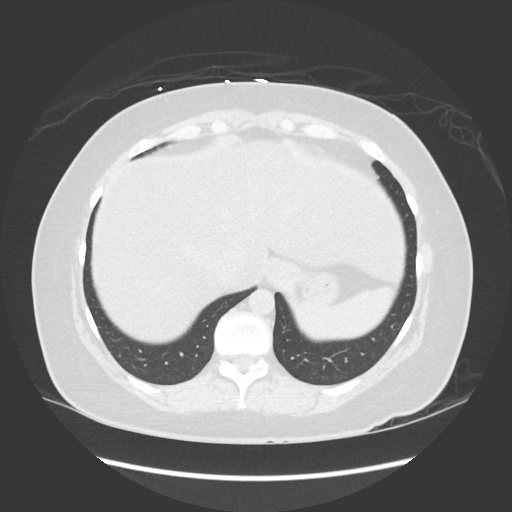
[im 170/189  bone]
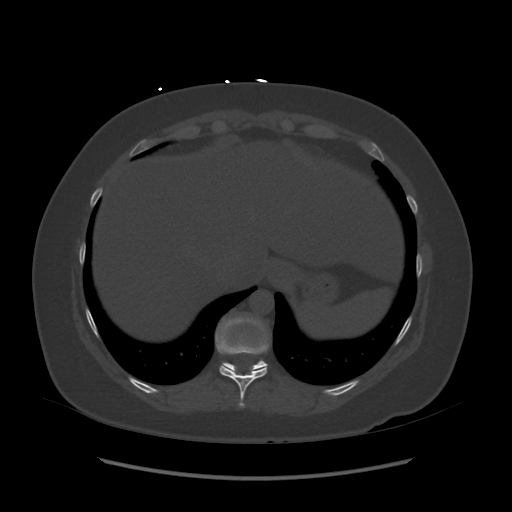

[Series 602: sagittal · sagittal · 0.92mm/px · 4 of 192 slices shown]
[im 18/192  soft-tissue]
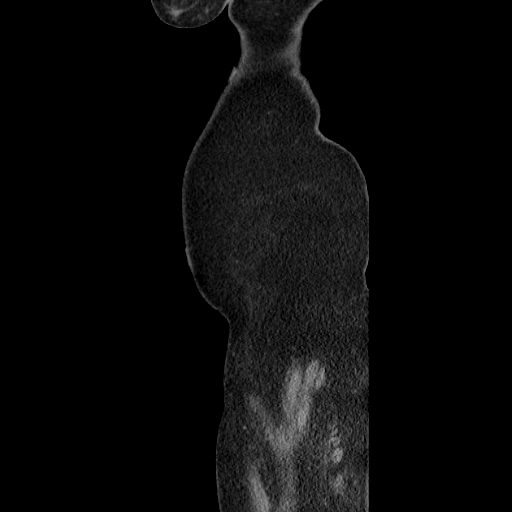
[im 35/192  soft-tissue]
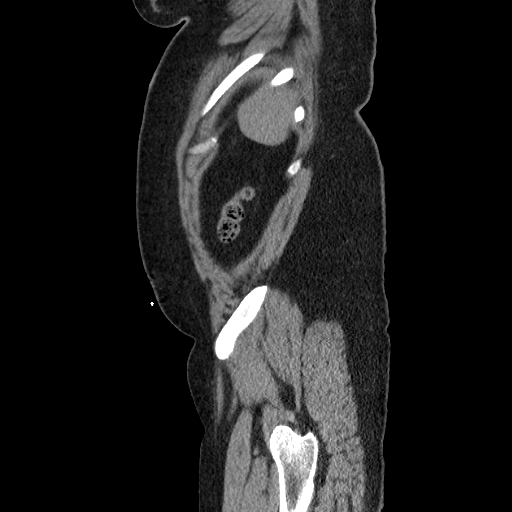
[im 70/192  soft-tissue]
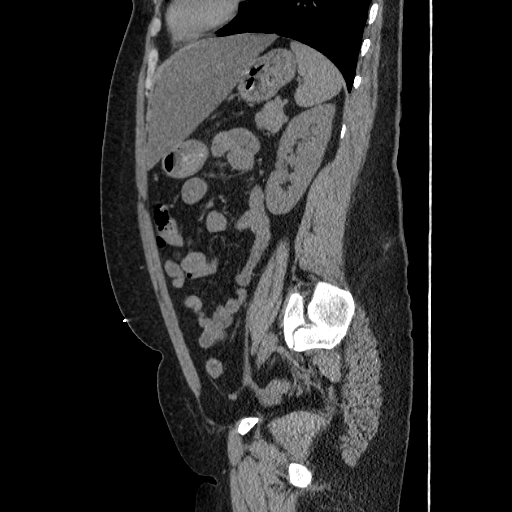
[im 87/192  soft-tissue]
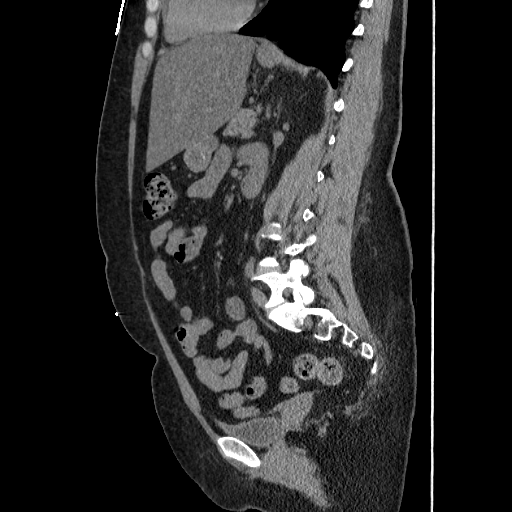

[13 of 32 positions shown; findings below may reference images not displayed]

FINDINGS: The lung bases are clear. Bone window evaluation demonstrates no acute process. The vertebral bodies demonstrate normal height and alignment. Soft tissue evaluation demonstrates unremarkable abdominal wall. Heart size within normal limits. There is hepatomegaly with fatty infiltration of the liver. The gallbladder pancreas spleen and adrenal glands unremarkable. The kidneys demonstrate no acute process. The stomach and small bowel loops unremarkable. Bladder is unremarkable. Uterus is within normal limits. The colon is within normal limits. There is no retroperitoneal lymphadenopathy. Coronal imaging supports the above findings.
IMPRESSION: 1. Hepatomegaly with fatty infiltration.
2. No definite evidence of an acute process.
Is the patient pregnant?
No

## 2022-06-07 IMAGING — DX XR CHEST 1 VIEW
1 series · 1 of 1 positions shown · non-contrast
Comparison: 01/22/2022

Known heart issues.  For the last week or so she has felt more tired and lethargic, more sob, and her heart fills like its skipping beats
FINAL REPORT:
HISTORY: Noncardiac issues
Number of views:1

[AP]
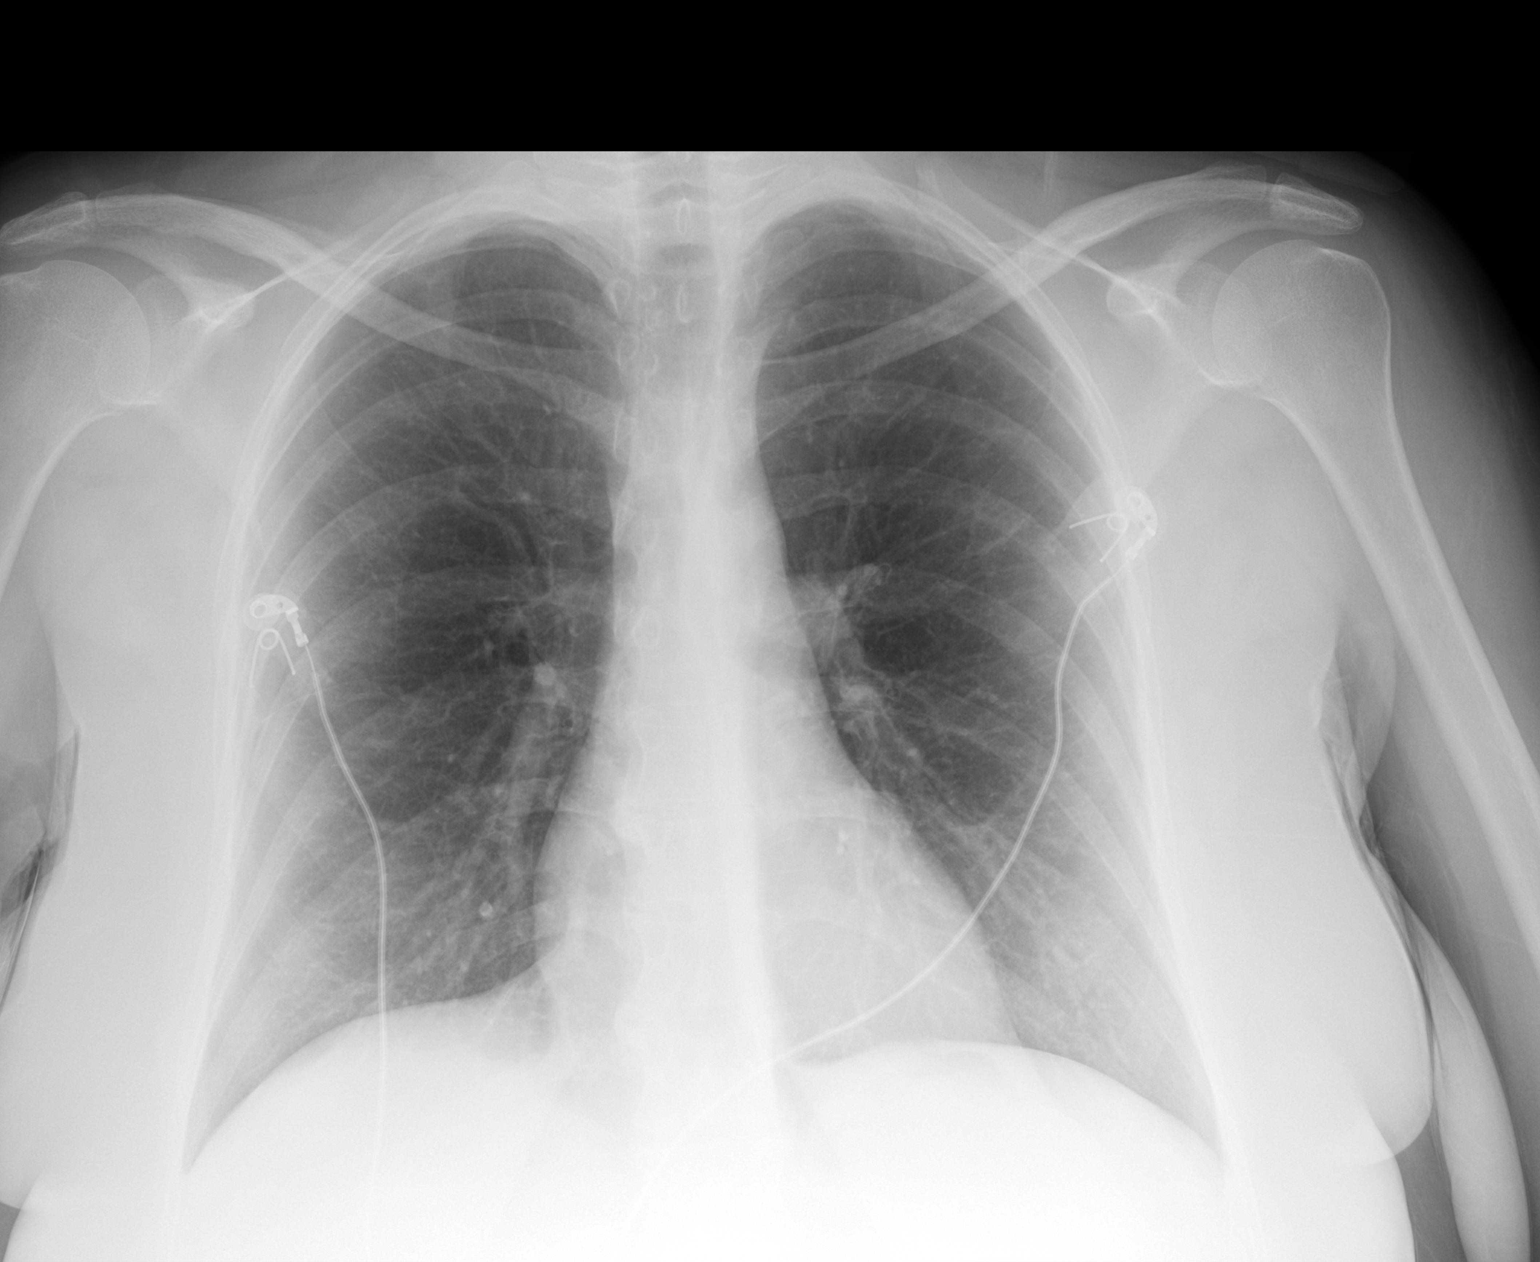

[1 of 1 positions shown; findings below may reference images not displayed]

FINDINGS: Cardiomediastinal contour is stable. No aggressive bone lesions are seen. Lungs are symmetrically aerated and clear
IMPRESSION: 
IMPRESSION: No acute findings
Is the patient pregnant?
Yes

## 2022-07-13 IMAGING — DX XR HUMERUS LEFT
1 series · 2 of 2 positions shown · non-contrast
Comparison: None available.

FINAL REPORT:
XR HUMERUS LEFT
INDICATION: Pain in left arm

[Series 7092: AP · left · 0.19mm/px · 2 of 2 slices shown]
[im 1/2]
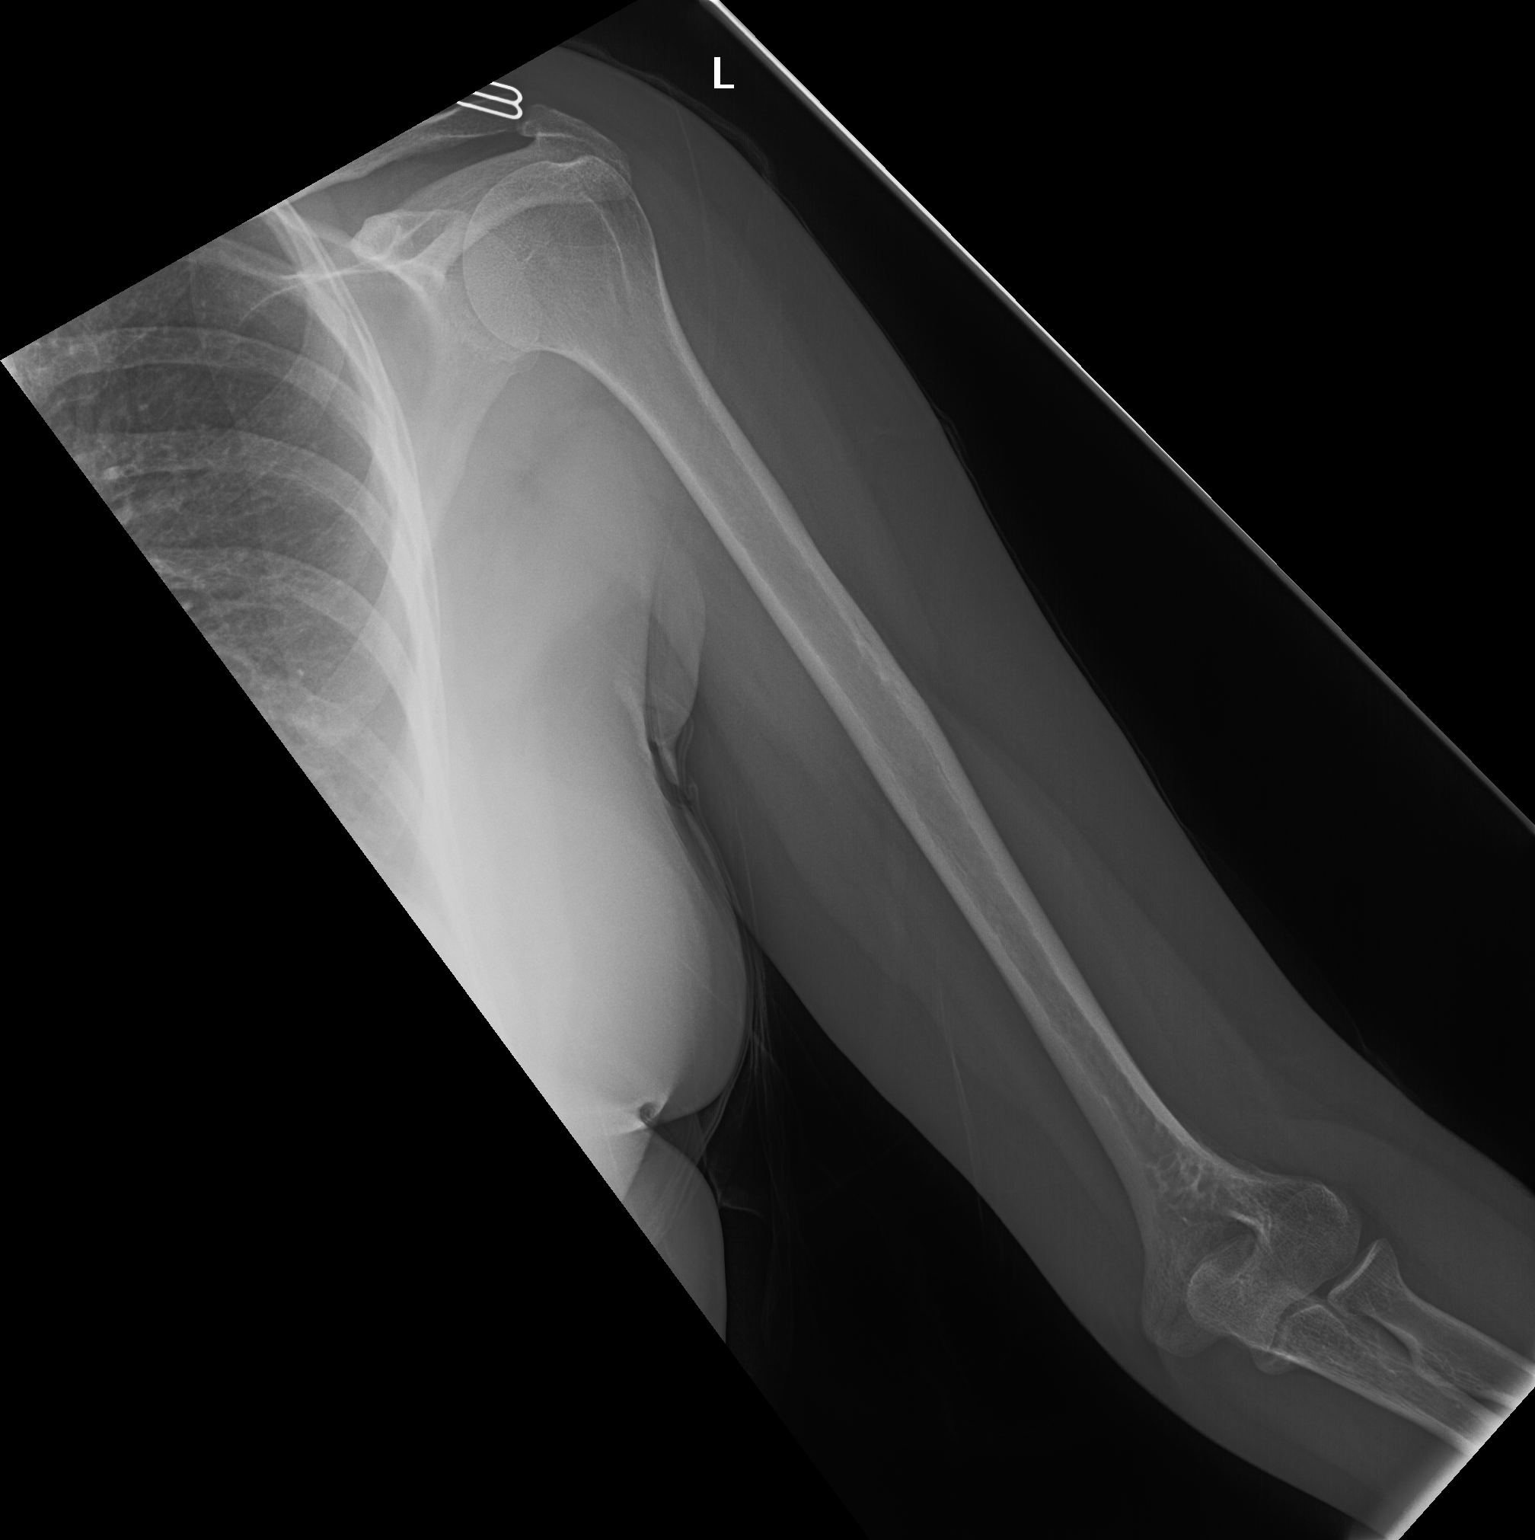
[im 2/2]
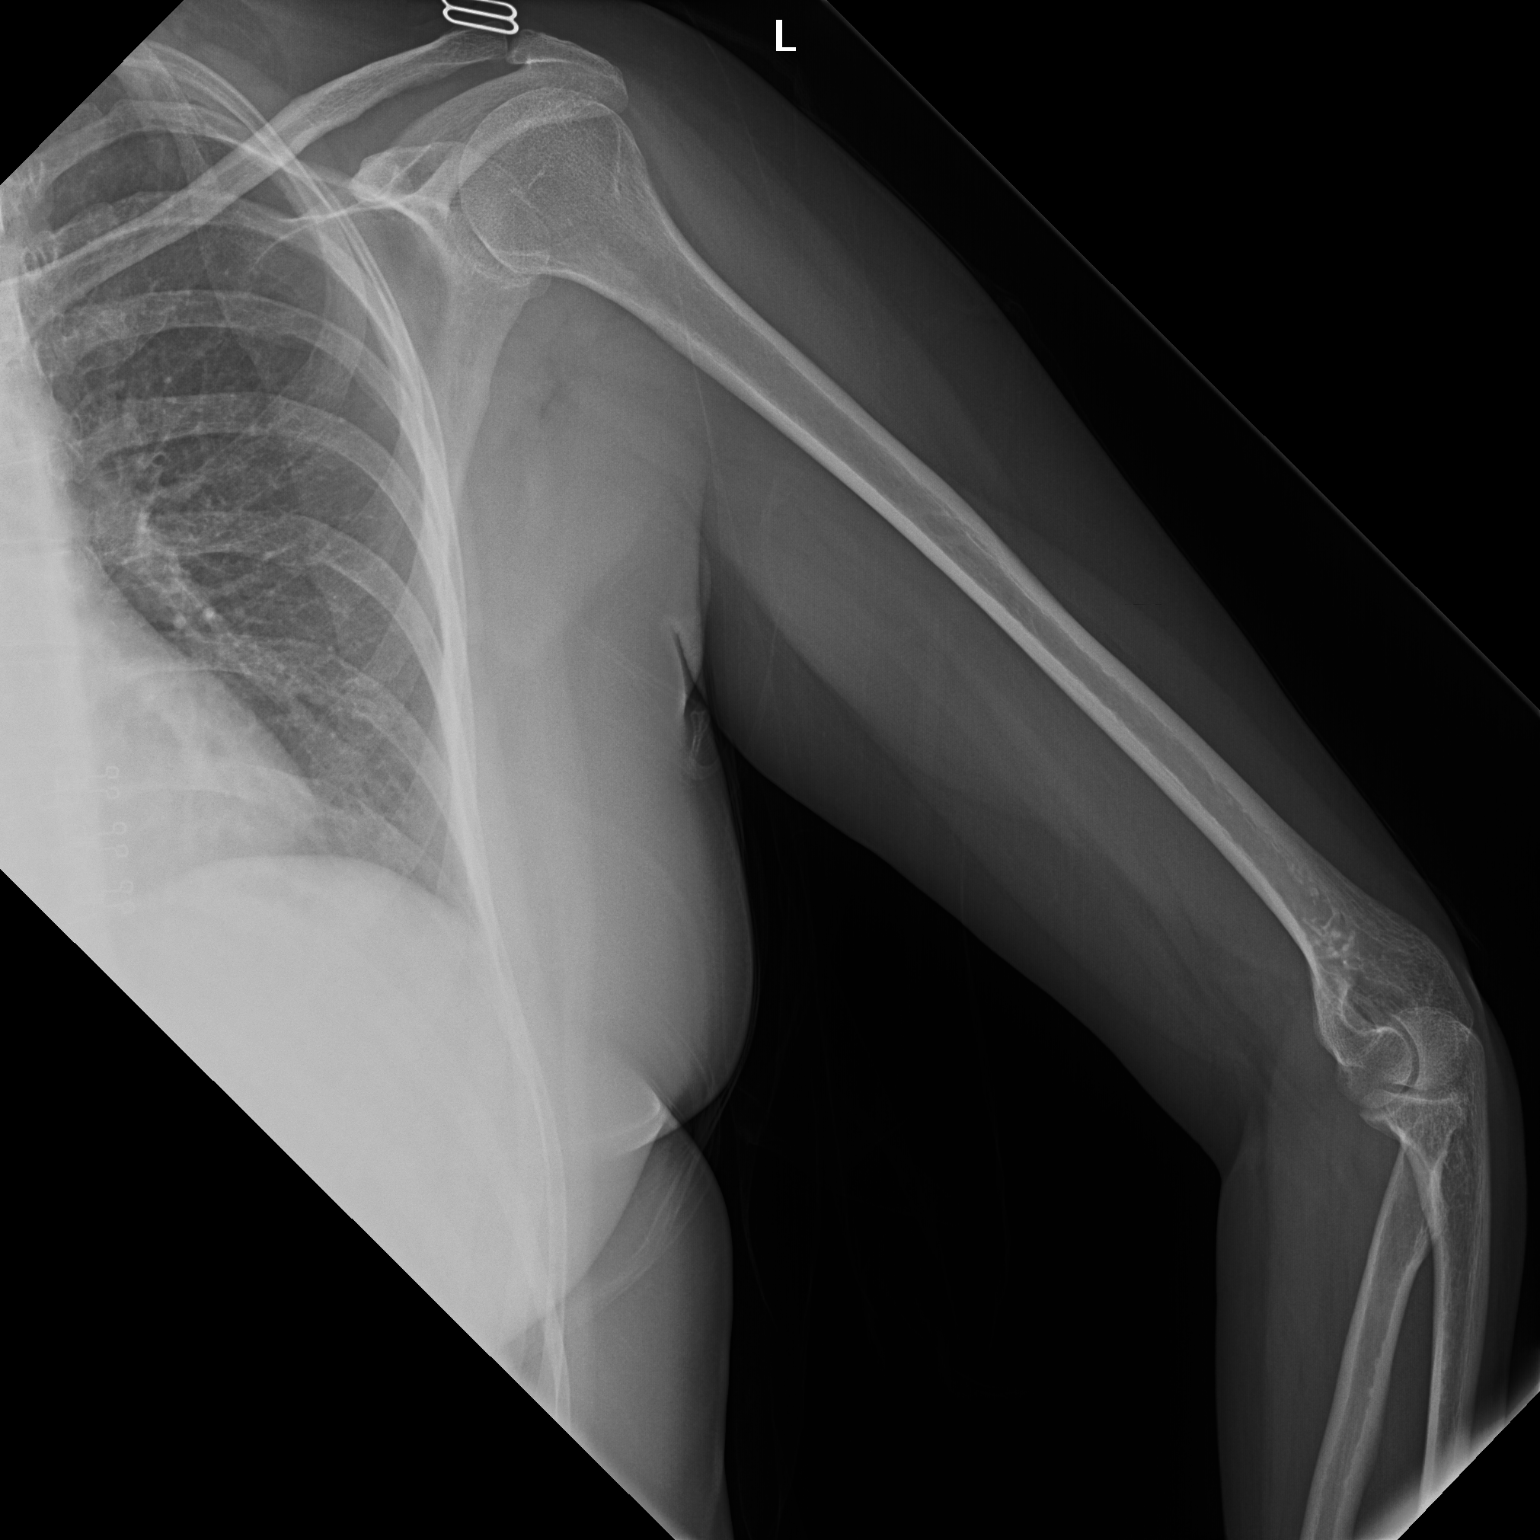

[2 of 2 positions shown; findings below may reference images not displayed]

FINDINGS: 3 views were obtained.
No acute fracture or malalignment. Joint spaces are maintained. No soft tissue swelling, radiopaque foreign body or gas.
IMPRESSION: No acute osseous abnormality.
Is the patient pregnant?
Unknown

## 2022-07-20 IMAGING — US US UP EXT NONVAS LEFT
1 series · 13 of 13 positions shown · non-contrast
Comparison: None

FINAL REPORT:
Ultrasound upper extremity nonvascular limited left
INDICATION: Pain in left arm , recent flu shot
TECHNIQUE: Grayscale and color ultrasound images obtained of the left upper extremity.

[Series 1: us up ext nonvas left · 13 acquisitions, 13 frames shown]
[im 1/13]
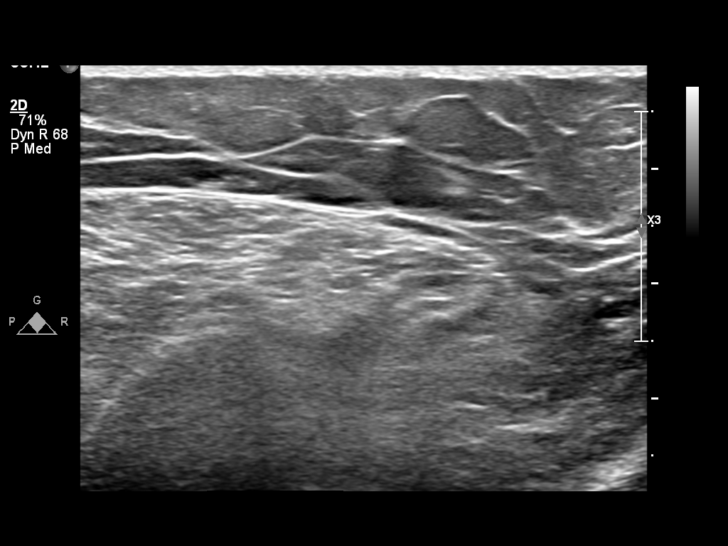
[im 2/13]
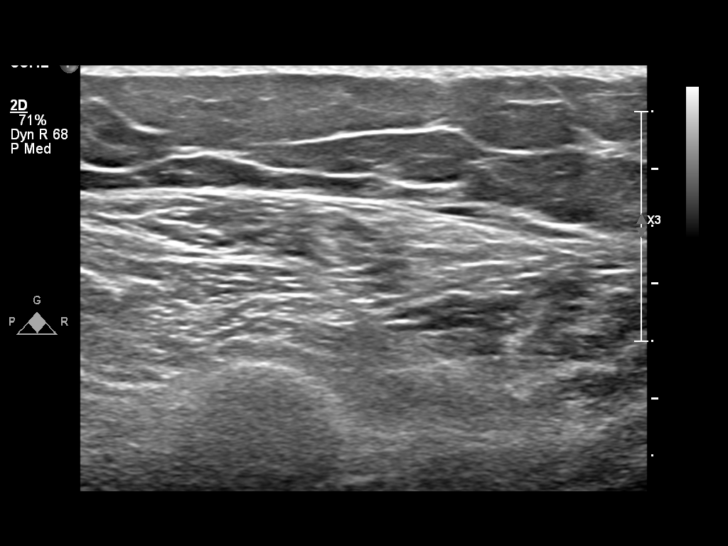
[im 3/13]
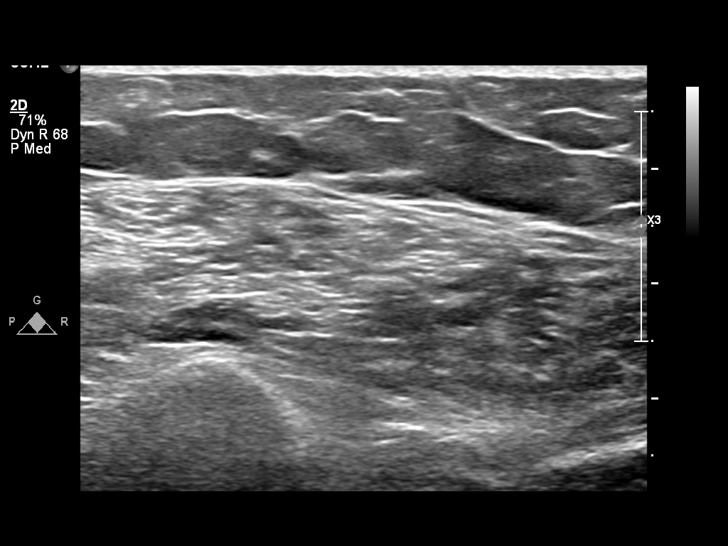
[im 4/13]
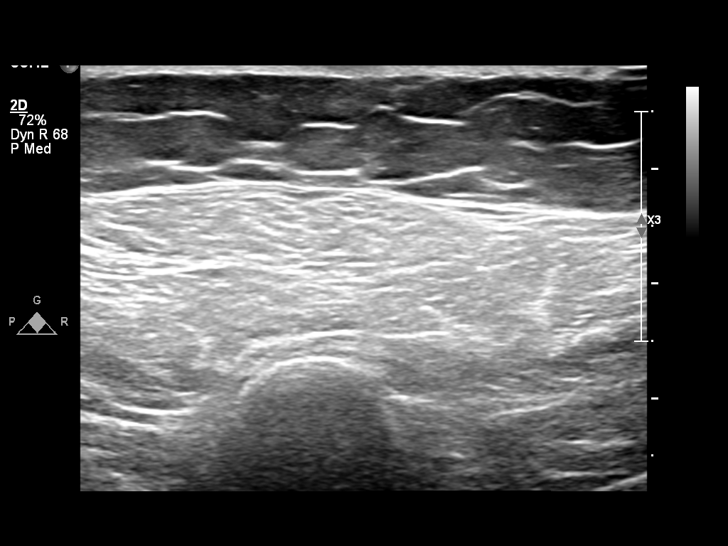
[im 5/13]
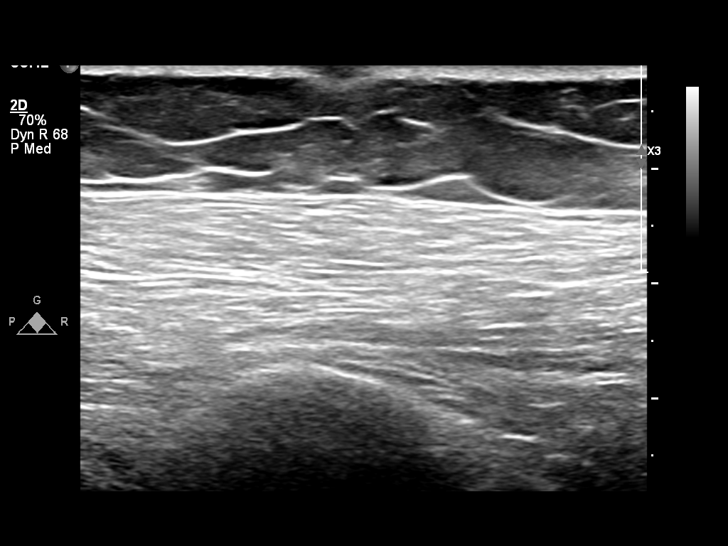
[im 6/13]
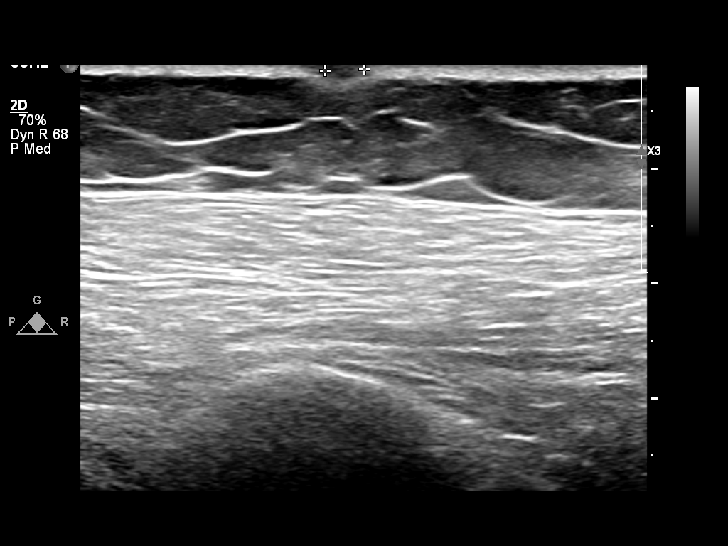
[im 7/13]
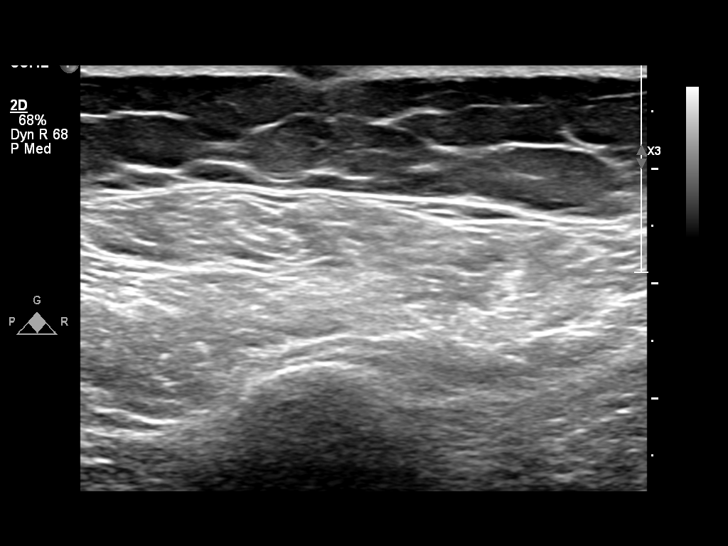
[im 8/13]
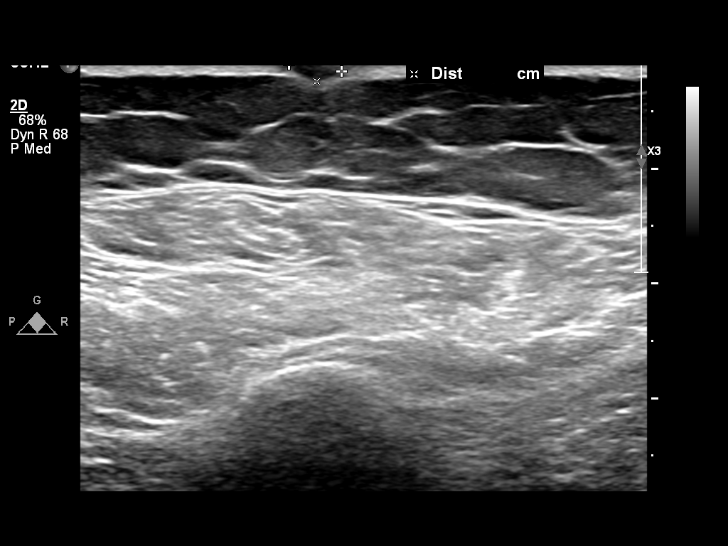
[im 9/13]
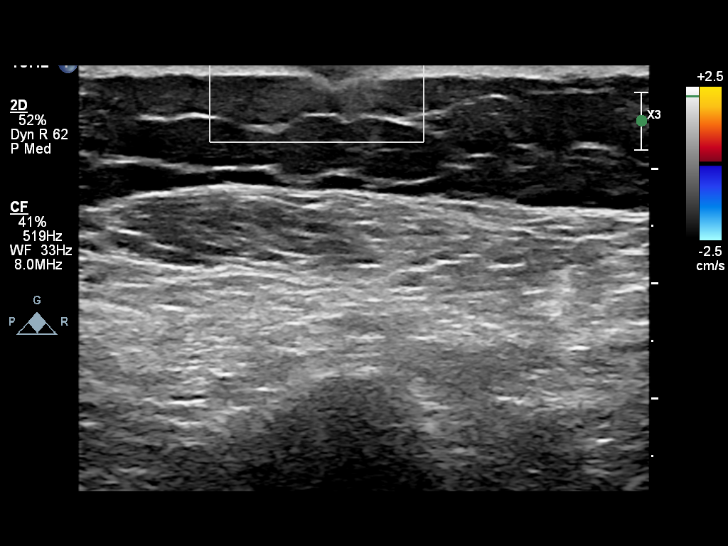
[im 10/13]
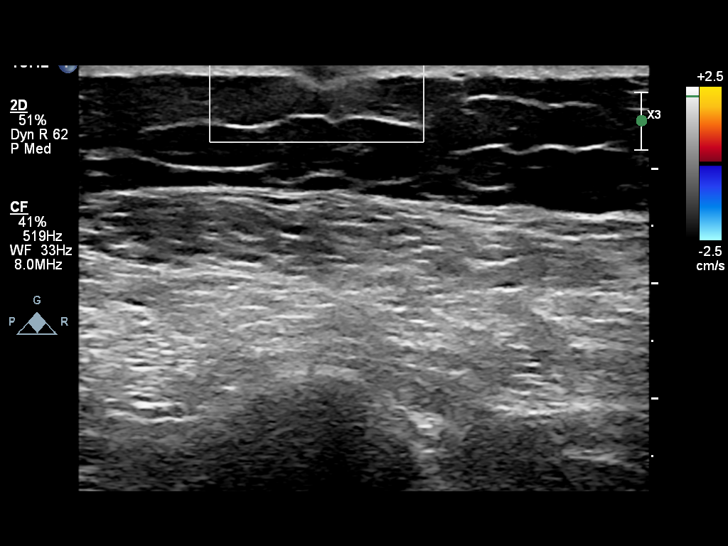
[im 11/13]
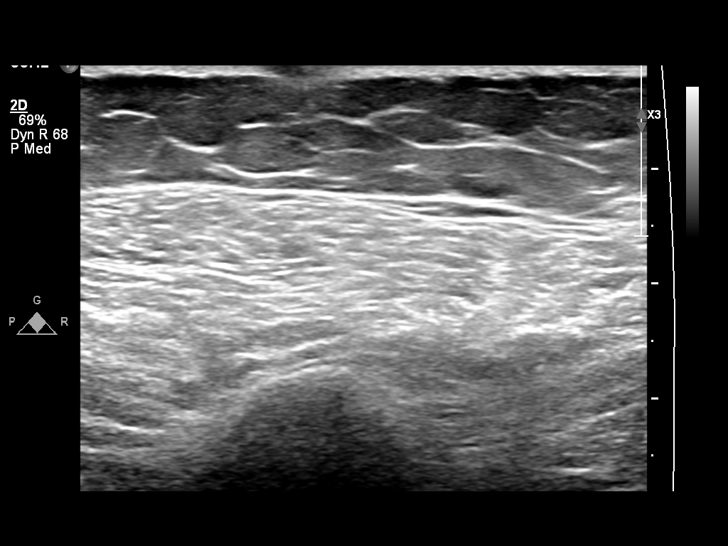
[im 12/13]
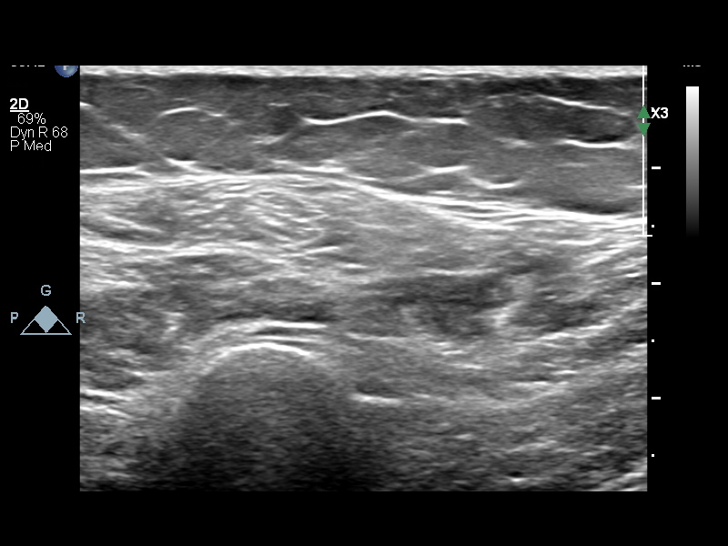
[im 13/13]
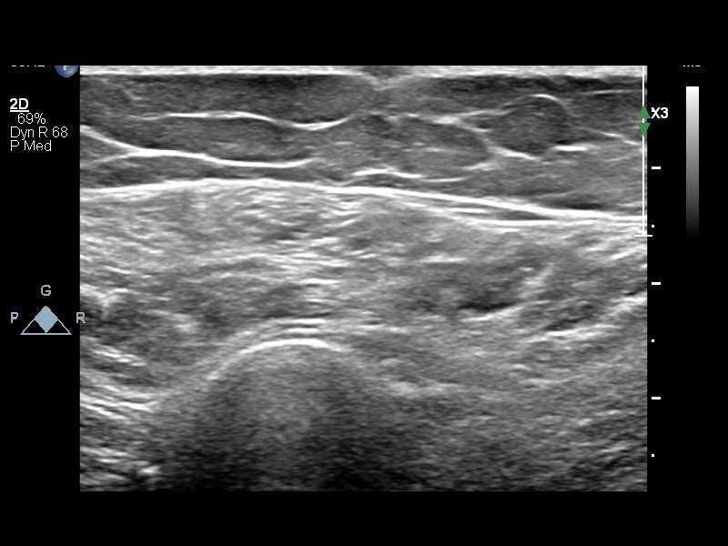

[13 of 13 positions shown; findings below may reference images not displayed]

FINDINGS: Small cutaneous hypoechoic area within the left upper extremity soft tissues, measuring 5 x 2 x 3 mm. This appears oblong and wider than tall. The underlying fascial planes appear intact. No subcutaneous air.
IMPRESSION: 
IMPRESSION: Nonspecific hypoechoic area within the cutaneous left upper extremity soft tissues. This could represent a small pustule. Recommend correlation on physical exam.

## 2022-08-12 IMAGING — MR MRI SHOULDER LEFT WITHOUT IV CONTRAST
11 series · 40 of 40 positions shown · non-contrast
Comparison: Radiographs of the left humerus 07/13/2022

FINAL REPORT:
MRI SHOULDER LEFT WITHOUT IV CONTRAST
INDICATION: Pain.
TECHNIQUE: Multiplanar multisequence MR imaging of the left shoulder was performed without contrast.

[Series 2: aa survey · coronal · left · 1.6mm · 1.56mm/px · 12 of 120 slices shown]
[im 1/120]
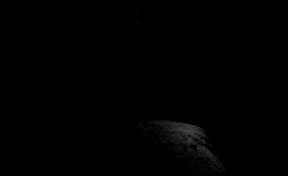
[im 11/120]
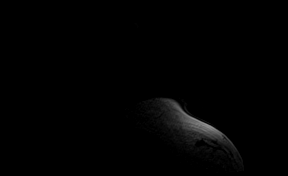
[im 22/120]
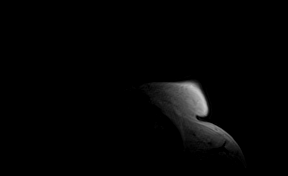
[im 33/120]
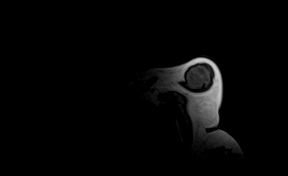
[im 44/120]
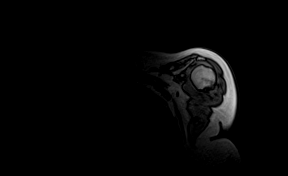
[im 55/120]
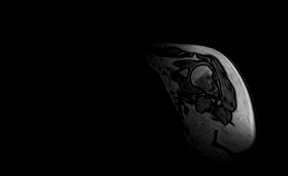
[im 65/120]
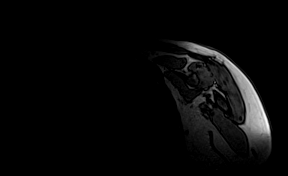
[im 76/120]
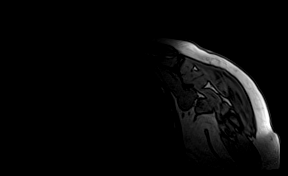
[im 87/120]
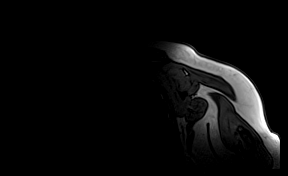
[im 98/120]
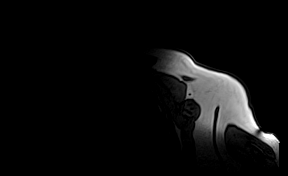
[im 109/120]
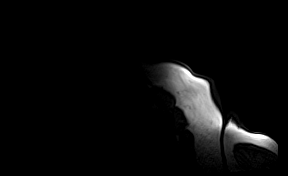
[im 120/120]
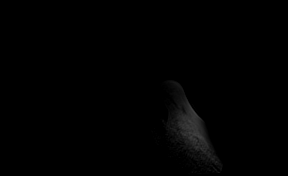

[Series 3: aa survey_mpr_sag · oblique · left · 1.6mm · 1.56mm/px · 1 of 9 slices shown]
[im 1/9]
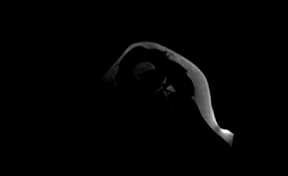

[Series 4: aa survey_mpr_cor · oblique · left · 1.6mm · 1.56mm/px · 1 of 11 slices shown]
[im 1/11]
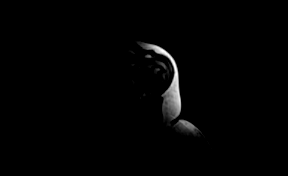

[Series 5: aa survey_mpr_(person_name) · axial · left · 1.6mm · 1.56mm/px · z∈[-0,+42]mm · 2 of 15 slices shown]
[im 1/15]
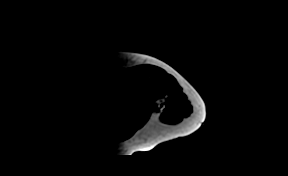
[im 15/15]
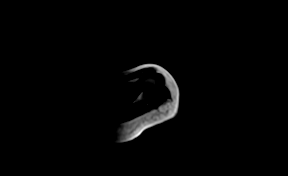

[Series 6: T1 · axial · left · 3.0mm · 0.47mm/px · z∈[+2,+96]mm · 4 of 32 slices shown (1 of 3)]
[im 1/32]
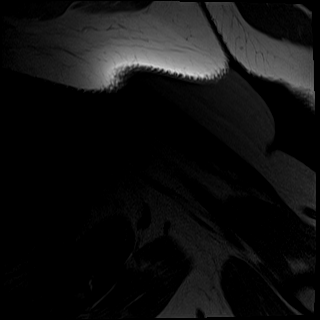
[im 11/32]
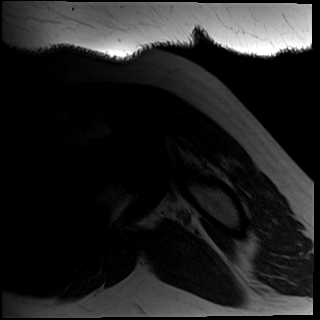
[im 21/32]
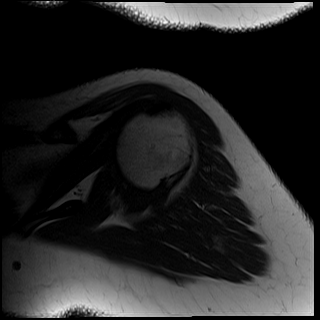
[im 32/32]
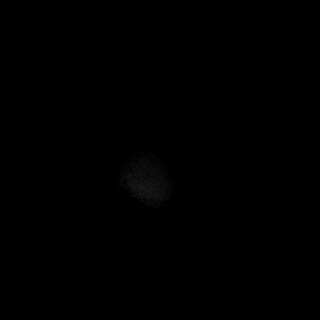

[Series 7: T2 fat-sat · axial · left · 3.0mm · 0.47mm/px · z∈[+2,+96]mm · 4 of 32 slices shown (1 of 2)]
[im 1/32]
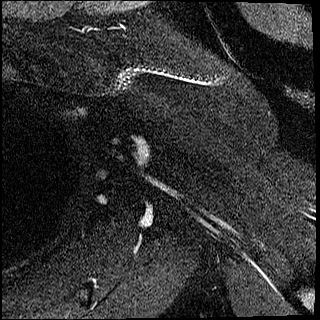
[im 11/32]
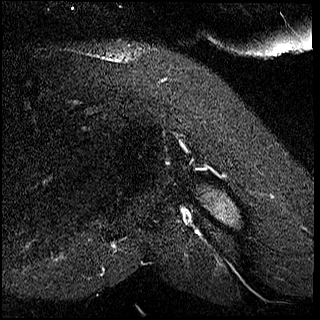
[im 21/32]
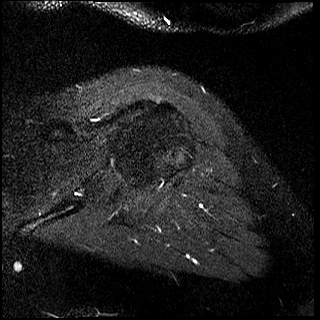
[im 32/32]
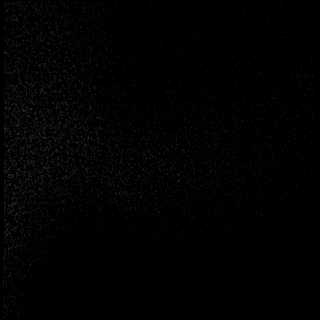

[Series 8: T1 · oblique · left · 3.0mm · 0.40mm/px · 3 of 26 slices shown (2 of 3)]
[im 1/26]
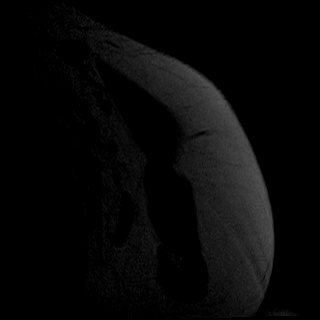
[im 13/26]
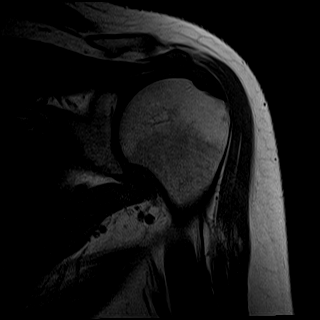
[im 26/26]
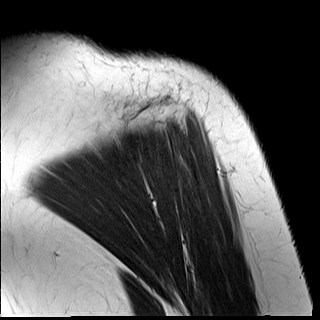

[Series 9: T2 fat-sat · oblique · left · 3.0mm · 0.40mm/px · 3 of 26 slices shown (2 of 2)]
[im 1/26]
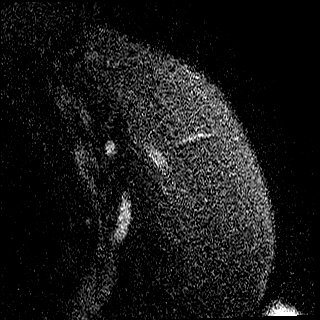
[im 13/26]
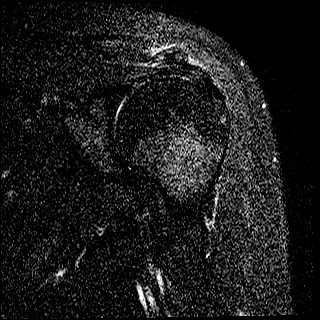
[im 26/26]
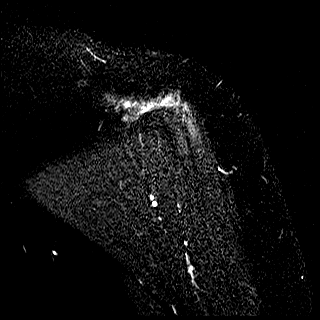

[Series 10: T1 · oblique · left · 3.0mm · 0.59mm/px · 3 of 27 slices shown (3 of 3)]
[im 1/27]
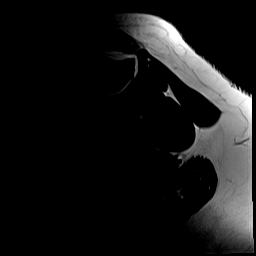
[im 14/27]
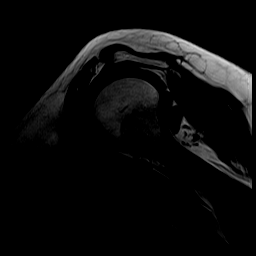
[im 27/27]
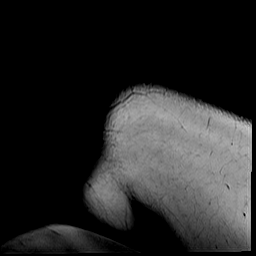

[Series 11: STIR · oblique · left · 3.0mm · 0.59mm/px · 3 of 27 slices shown]
[im 1/27]
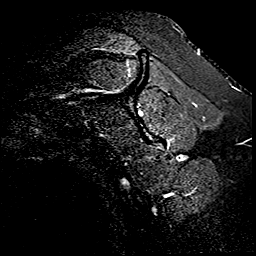
[im 14/27]
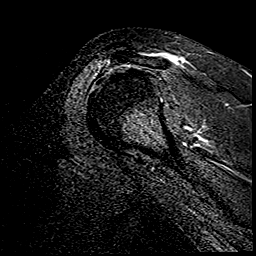
[im 27/27]
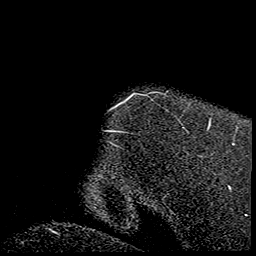

[Series 12: T1 fat-sat · axial · non-contrast · left · 3.0mm · 0.47mm/px · z∈[+2,+96]mm · 4 of 32 slices shown]
[im 1/32]
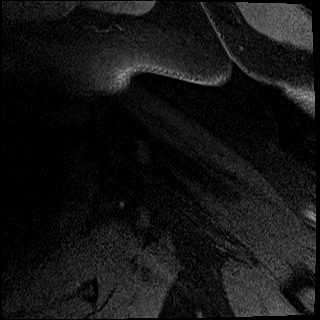
[im 11/32]
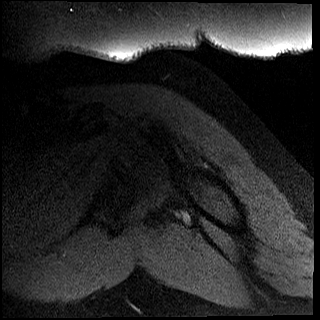
[im 21/32]
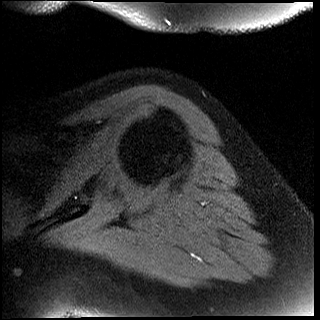
[im 32/32]
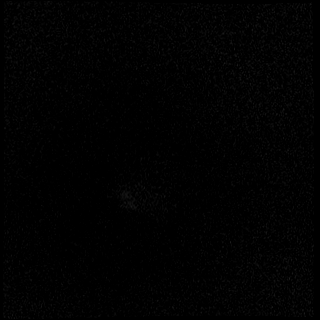

[40 of 40 positions shown; findings below may reference images not displayed]

FINDINGS: The rotator cuff tendons and long head of the biceps tendon are intact. There is no discrete labral tear. No high-grade chondral loss in the glenohumeral joint.
The acromioclavicular joint is normal. There is a type II acromial undersurface.
There is a physiologic amount of joint fluid. Minimal fluid signal in the subacromial subdeltoid bursa.. There is soft tissue edema at the acromial attachment of the posterior deltoid muscle (image 13 series 11, images 24-25 series 9). No muscle atrophy.
IMPRESSION: 
IMPRESSION: 1. Soft tissue edema along the posterior aspect of the acromion at the origin/attachment of the posterior deltoid muscle which may be consistent with muscle strain.
2. No evidence of a rotator cuff or labral tear.
Is the patient pregnant?
Unknown

## 2022-09-12 IMAGING — US US ABDOMEN RUQ
1 series · 14 of 25 positions shown · non-contrast
Comparison: CT abdomen and pelvis May 13, 2022

FINAL REPORT:
EXAM: Limited abdominal ultrasound.
HISTORY: pain
TECHNIQUE: Grayscale, color Doppler, and spectral Doppler sonography was performed of the abdomen.

[Series 1: us abdomen ruq · 14 of 88 slices shown]
[im 1/88]
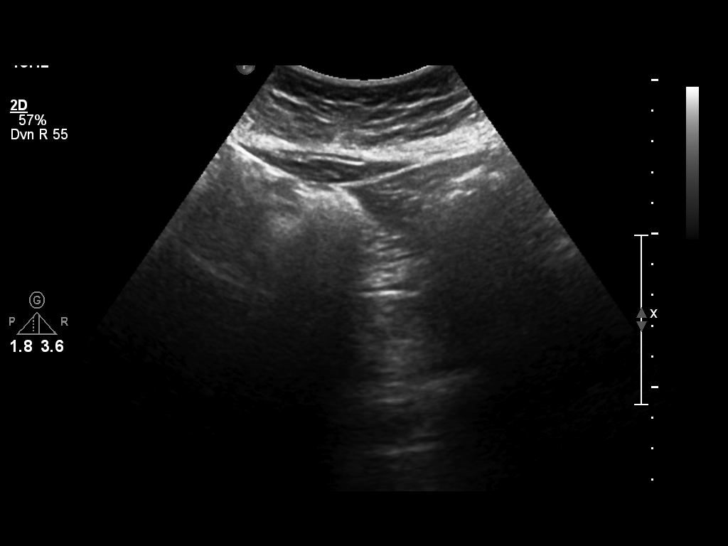
[im 8/88]
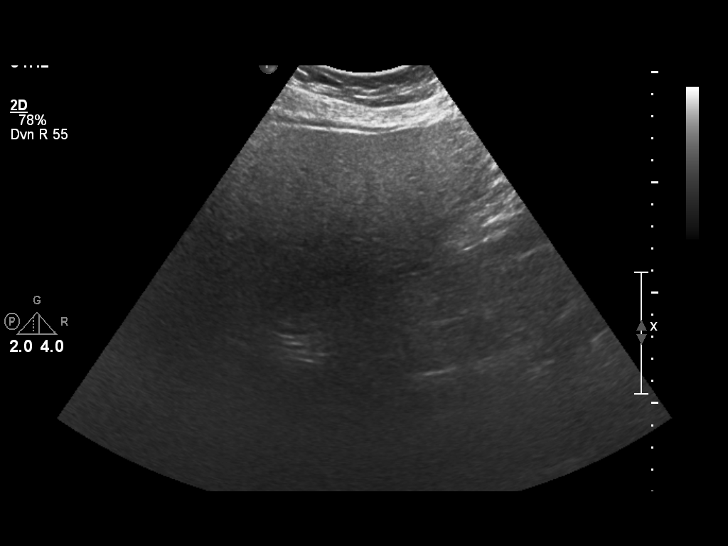
[im 15/88]
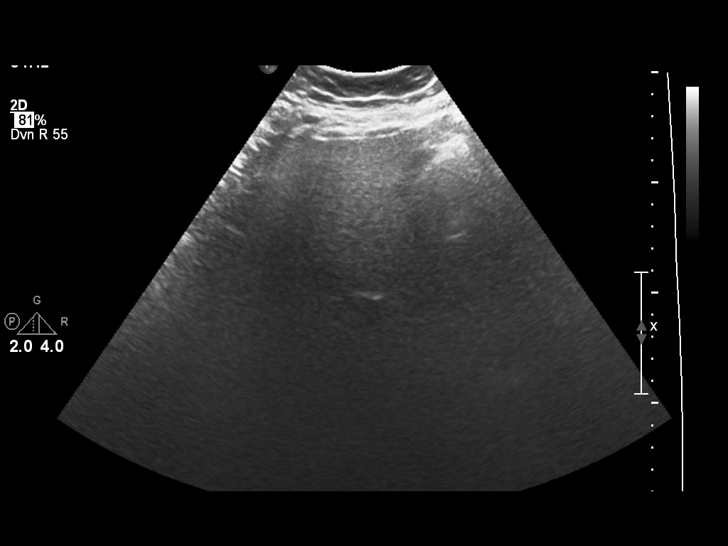
[im 22/88]
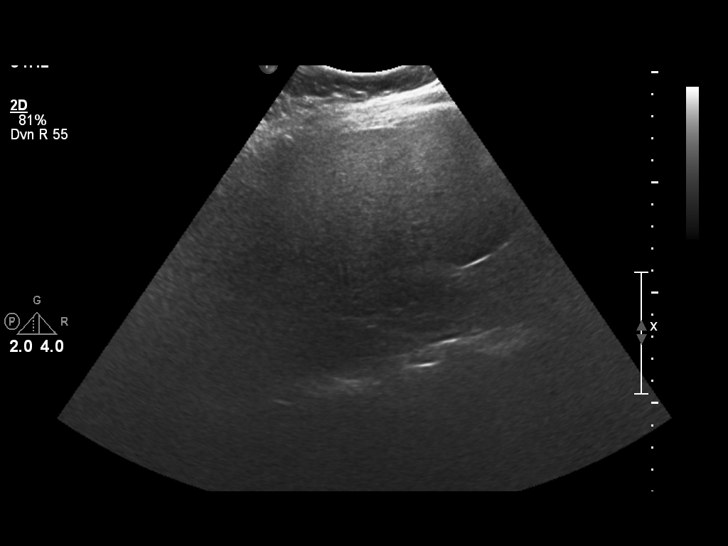
[im 30/88]
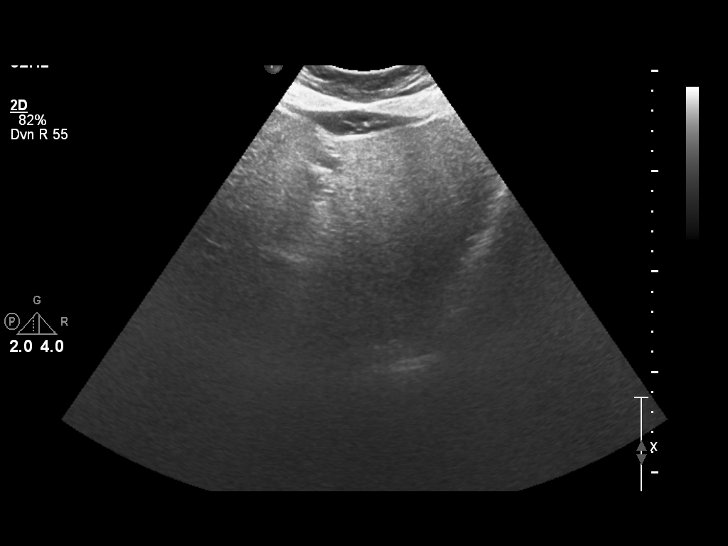
[im 33/88]
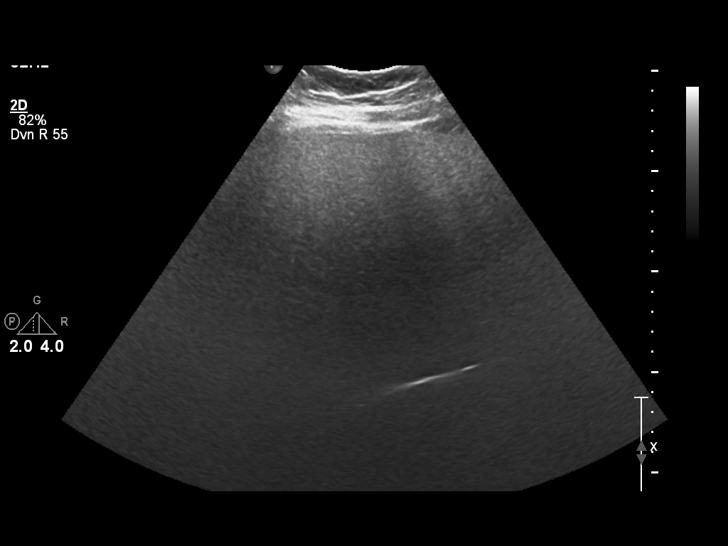
[im 40/88]
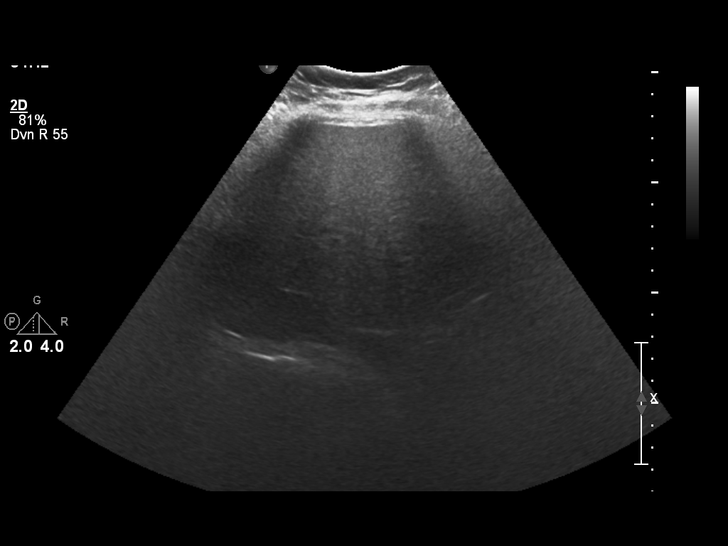
[im 48/88]
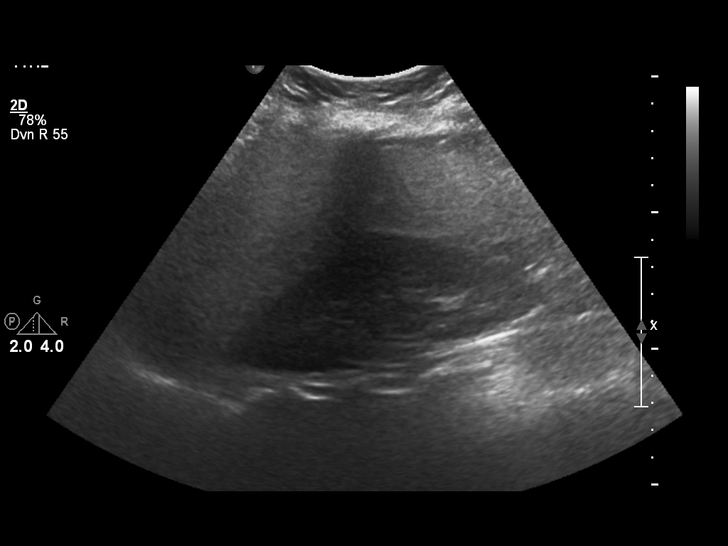
[im 55/88]
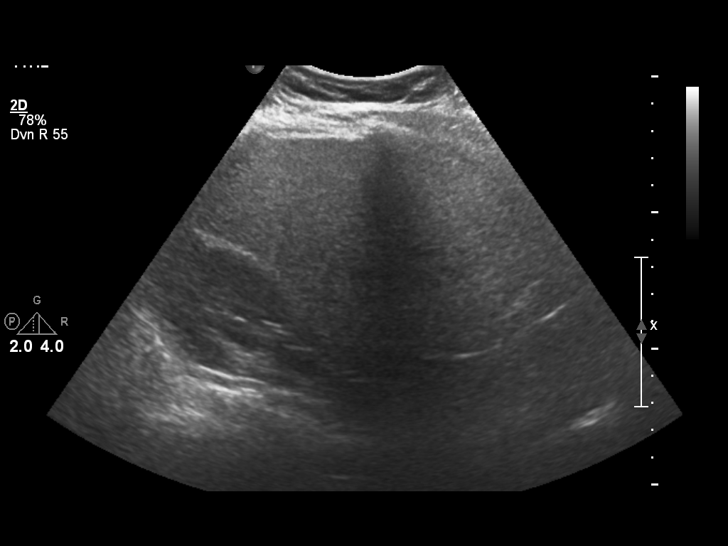
[im 59/88]
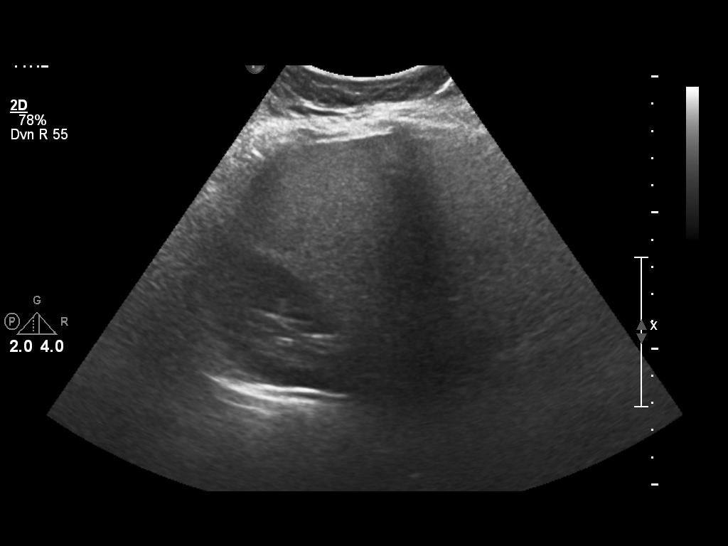
[im 66/88]
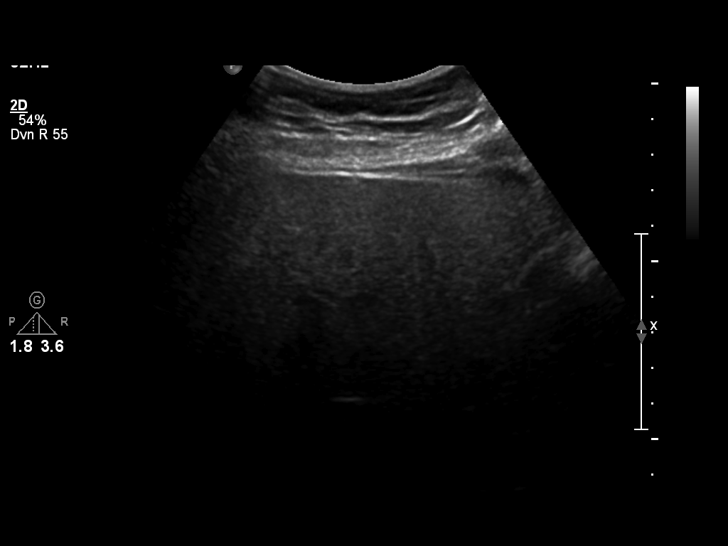
[im 73/88]
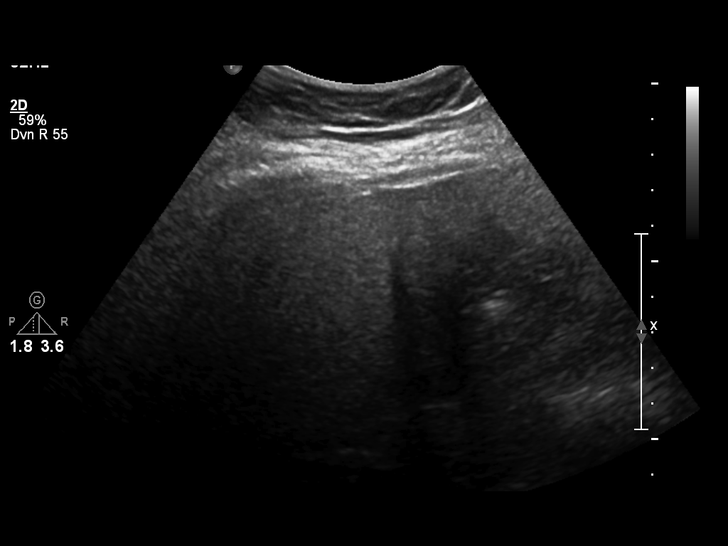
[im 80/88]
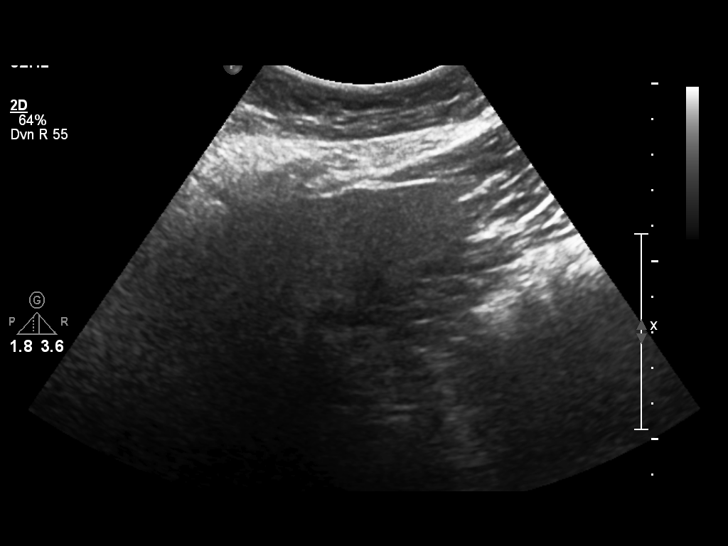
[im 88/88]
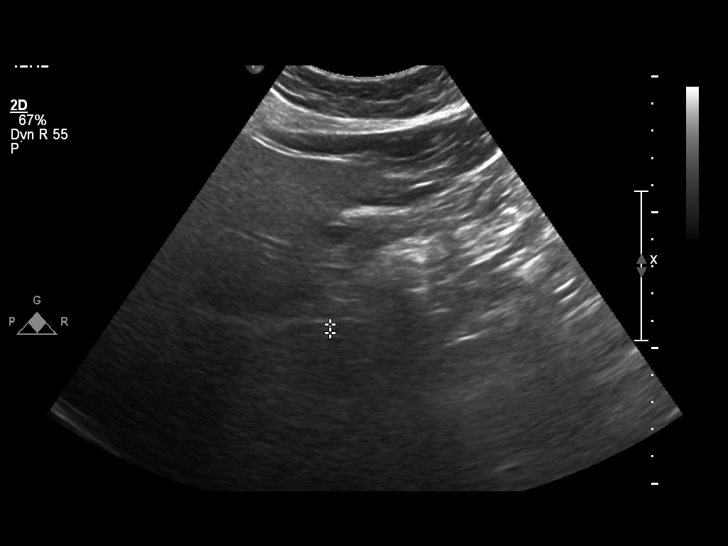

[14 of 25 positions shown; findings below may reference images not displayed]

FINDINGS: The pancreas is not visualized due to overlying bowel gas.
The liver demonstrates increased echogenicity and is enlarged. Liver is partially visualized, but no focal hepatic lesions are identified. The portal vein is is not visualized.
The common bile duct measures 3 mm in transverse diameter.
The gallbladder is contracted. No cholelithiasis or gallbladder sludge.. The gallbladder wall measures 2 mm, and there is a negative sonographic Murphy sign.
The right kidney measures 12.1 cm. There is no hydronephrosis calculus in the right kidney is identified.
The abdominal aorta and IVC are not visualized.
There is no ascites.
IMPRESSION: Liver parenchyma is echogenic, compatible with hepatic steatosis. Liver is enlarged.

## 2022-09-12 IMAGING — CT CT ABDOMEN PELVIS WITHOUT CONTRAST
2 of 3 series · 17 of 46 positions shown, 19 images · non-contrast
Comparison: Abdomen pelvis CT from 05/13/2022.

RIGHT SIDE FLANK PAIN
HX OF APPENDECTOMY
FINAL REPORT:
Abdomen CT without contrast
Pelvis CT without contrast
HISTORY: Right flank pain
TECHNIQUE: Images were acquired through the abdomen and pelvis without IV contrast.

[Series 2: renal stone · axial · 0.83mm/px · z∈[-485,-62]mm · 14 of 195 slices shown, 16 images]
[im 13/195  soft-tissue]
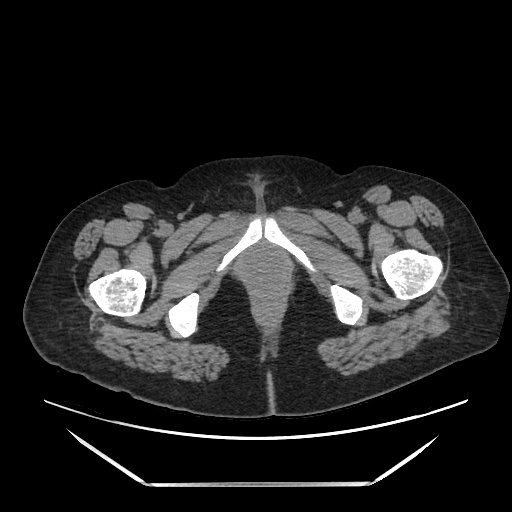
[im 13/195  bone]
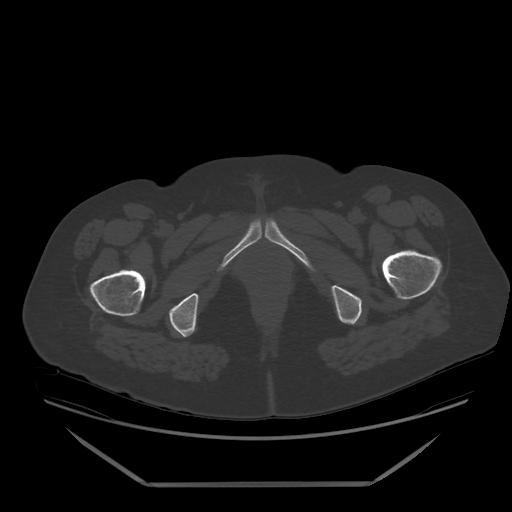
[im 26/195  soft-tissue]
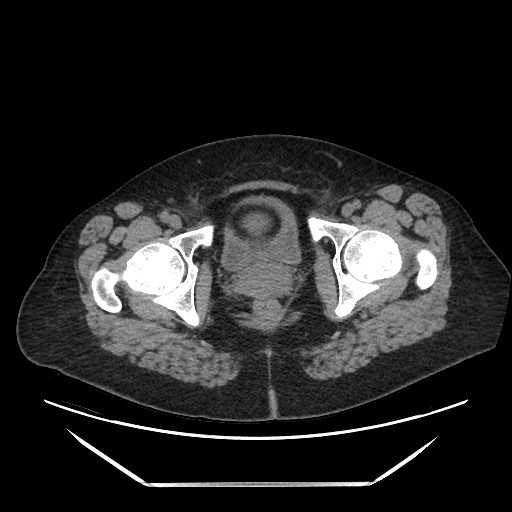
[im 38/195  soft-tissue]
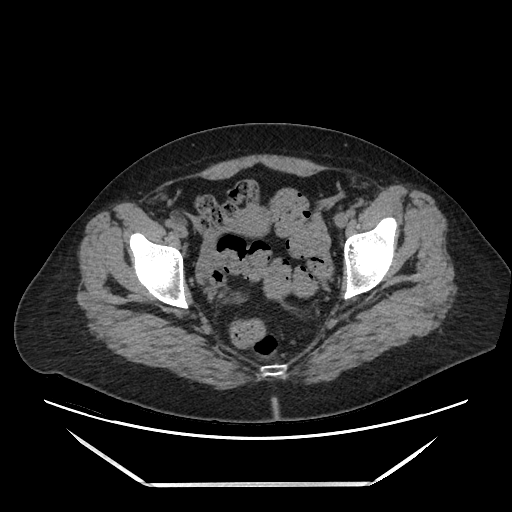
[im 51/195  soft-tissue]
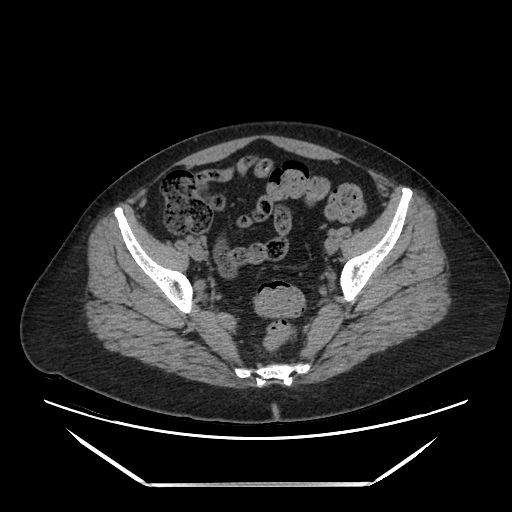
[im 63/195  soft-tissue]
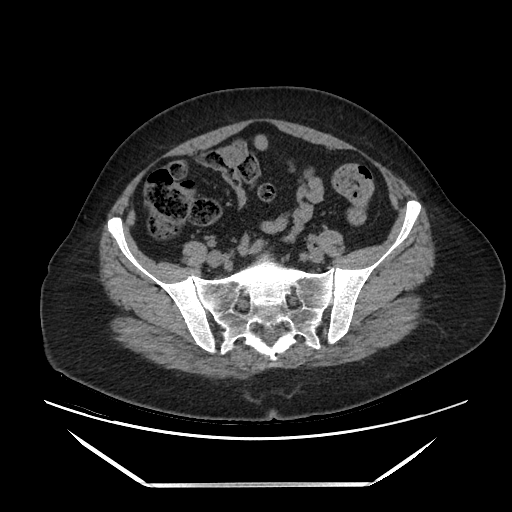
[im 76/195  soft-tissue]
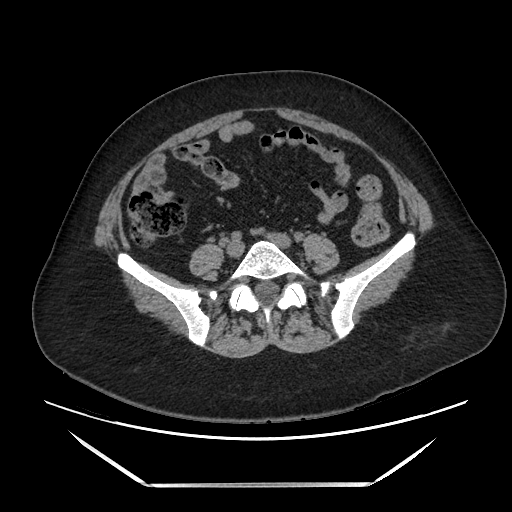
[im 88/195  soft-tissue]
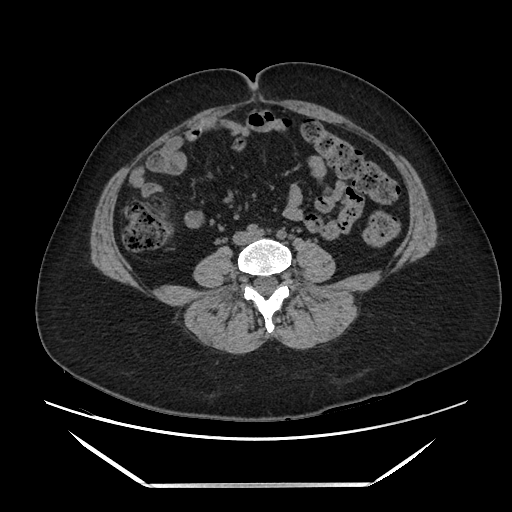
[im 107/195  soft-tissue]
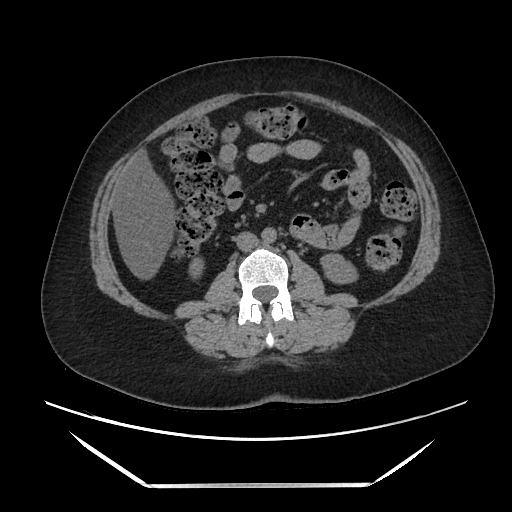
[im 119/195  soft-tissue]
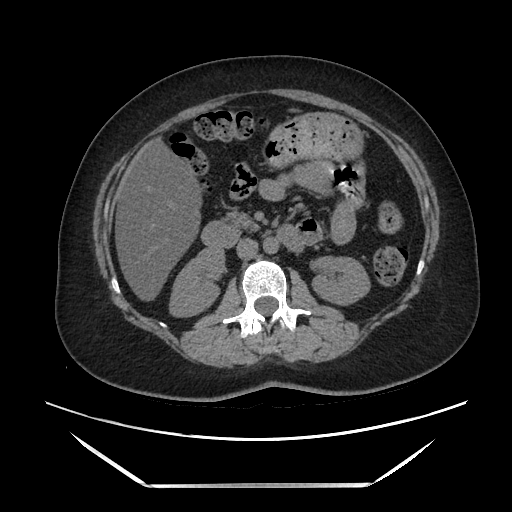
[im 119/195  bone]
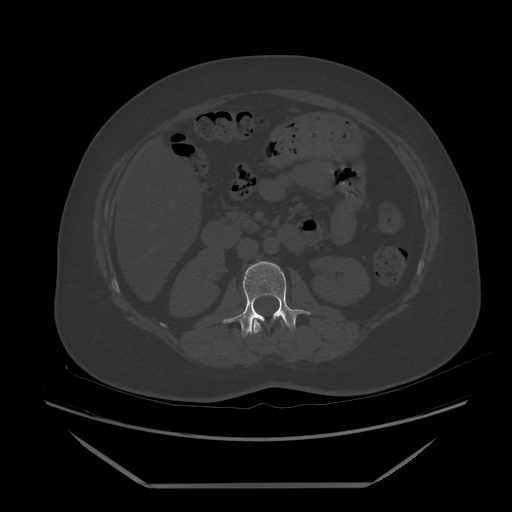
[im 132/195  soft-tissue]
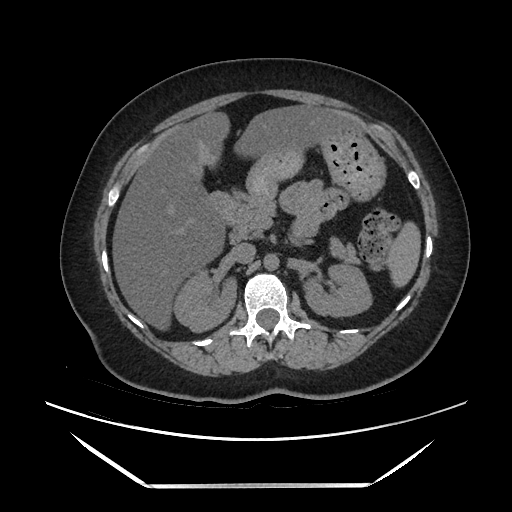
[im 144/195  soft-tissue]
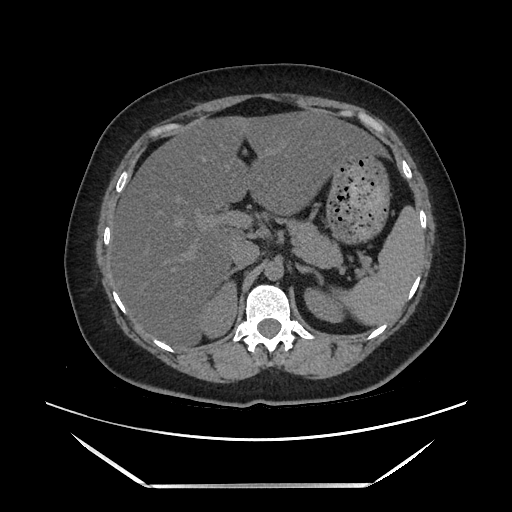
[im 157/195  soft-tissue]
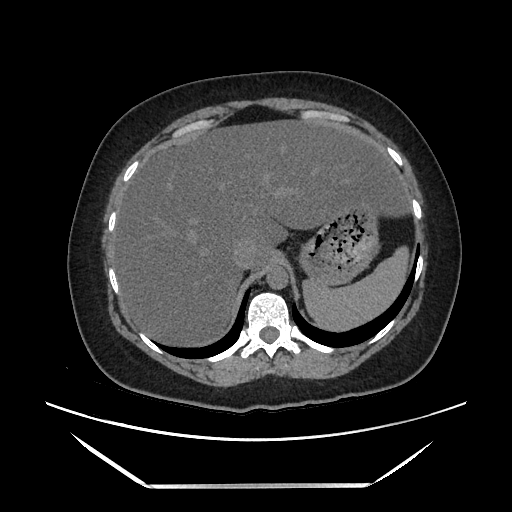
[im 169/195  soft-tissue]
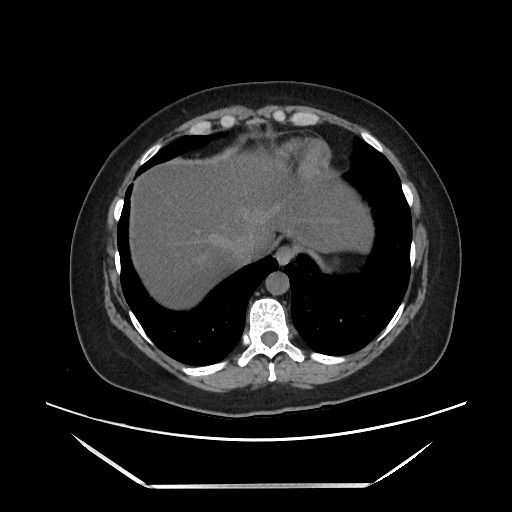
[im 182/195  soft-tissue]
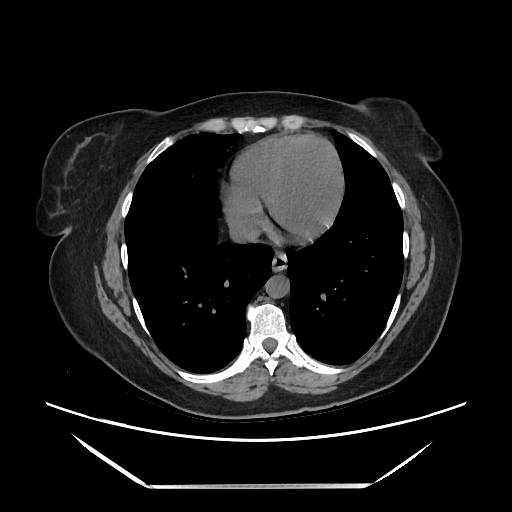

[Series 602: sag standard 2x2 · sagittal · 0.95mm/px · 3 of 207 slices shown]
[im 69/207  soft-tissue]
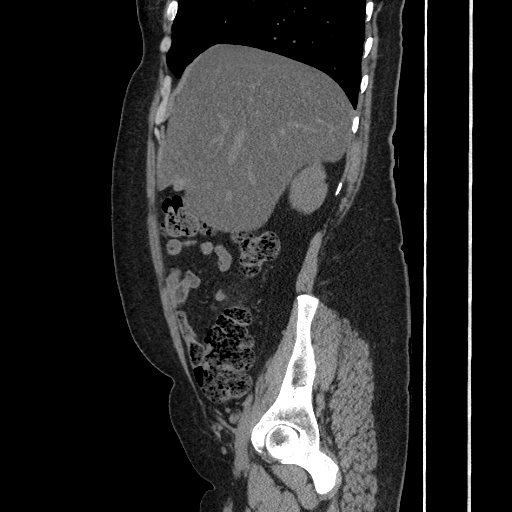
[im 92/207  soft-tissue]
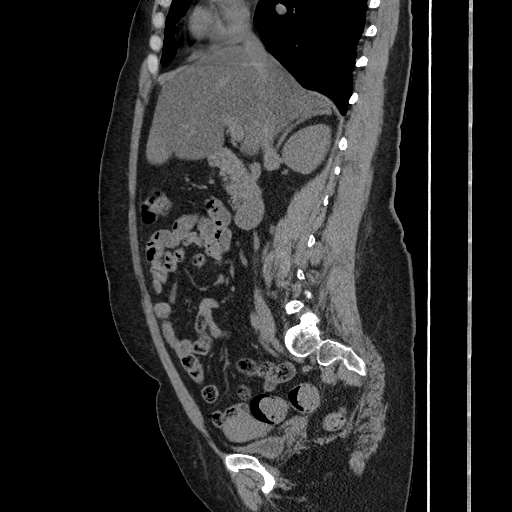
[im 115/207  soft-tissue]
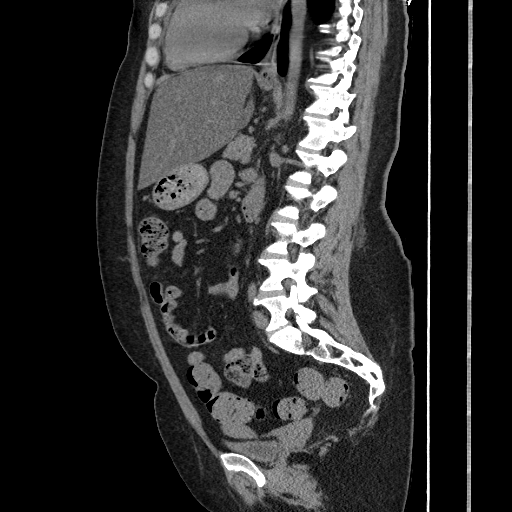

[17 of 46 positions shown; findings below may reference images not displayed]

FINDINGS: ABDOMEN: Imaged portions of the lung bases are clear. There is diffuse fatty filtration of the liver unchanged. The gallbladder, spleen, adrenals, pancreas, and kidneys are unremarkable. No obstructing renal calculi or hydronephrosis. No abdominal aneurysm.
Pelvis: Patient has had appendectomy. No abnormal bowel wall thickening of the colon or small bowel. No suspicious adenopathy or significant free fluid in the abdomen or pelvis. The bladder is decompressed and grossly unremarkable.
IMPRESSION: 1. No acute findings in the abdomen or pelvis by noncontrasted CT.
2. Diffuse fatty filtration of the liver unchanged.
All CT scans at this facility use iterative reconstruction technique, dose modulation and/or weight based dosing when appropriate to reduce radiation dose to as low as reasonably achievable.
Is the patient pregnant?
Unknown

## 2022-10-13 IMAGING — DX XR CHEST 1 VIEW
1 series · 1 of 1 positions shown · non-contrast
Comparison: 06/07/2022

FINAL REPORT:
INDICATION: cp

[AP]
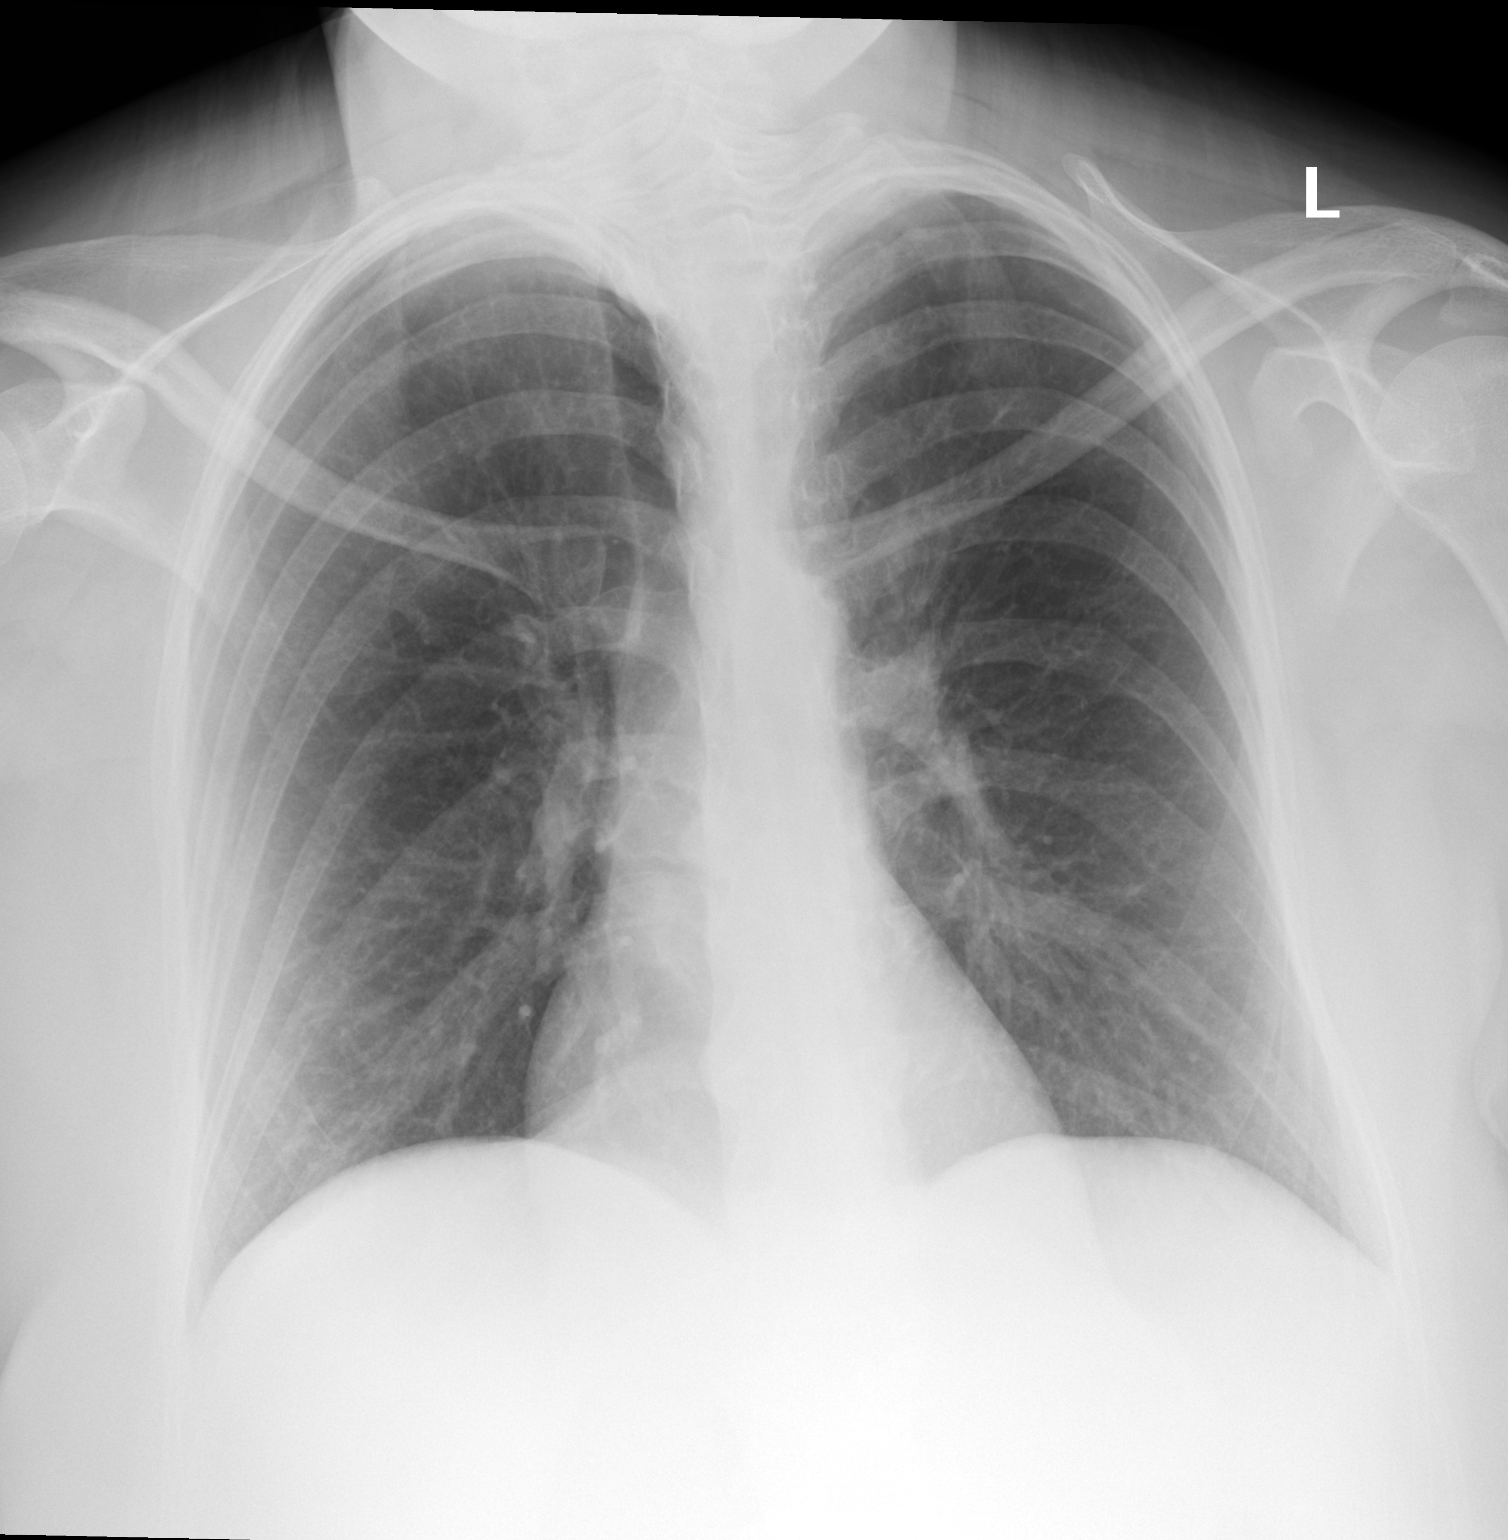

[1 of 1 positions shown; findings below may reference images not displayed]

FINDINGS: 1 view of the chest. The cardiomediastinal contours are within normal limits. No evidence of focal consolidation, pleural effusion, pulmonary edema, or pneumothorax.
IMPRESSION: 
IMPRESSION: No acute cardiopulmonary findings.
Is the patient pregnant?
No

## 2023-01-15 IMAGING — US US PELVIS TRANSVAGINAL
1 series · 14 of 25 positions shown · non-contrast
Comparison: none

This is a summary report. The complete report is available in the patient's medical record. If you cannot access the medical record, please contact the sending organization for a detailed fax or copy.
INDICATION: Pelvic pain and irregular bleeding.
LMP: 01/14/2023
Ultrasound demonstrated uterus that measures 8.8 x 4.2 x 4.5 cm.  Endometrium appears thickened and measures 0.8 cm. Hyperechoic area measuring 3.5 x 2.1 x 2 cm noted in the fundal area of uterus and appears to be disrupting the endometrial cavity.  Right ovary measures 1.8 x 1.7 x 1.4 cm.  Left ovary measures 1.9 x 1.5 x 1.3 cm.  No free fluid seen within the pelvis. Transvaginal ultrasound performed.

[Series 1: us pelvis transvaginal · 14 of 34 slices shown]
[im 1/34]
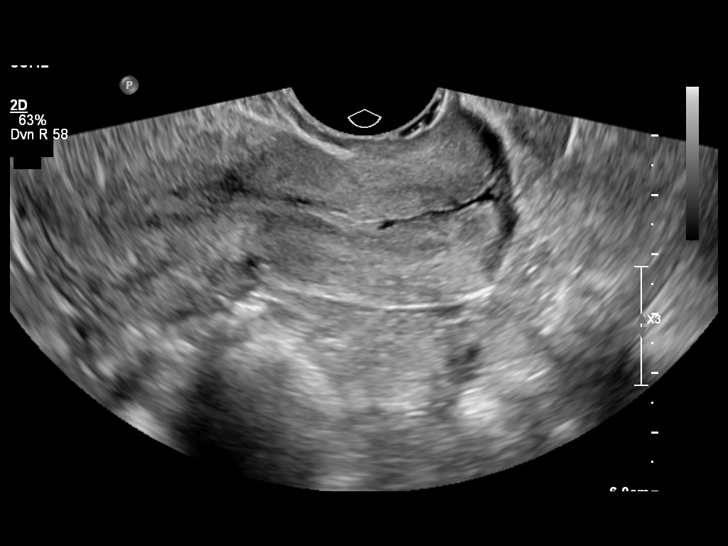
[im 3/34]
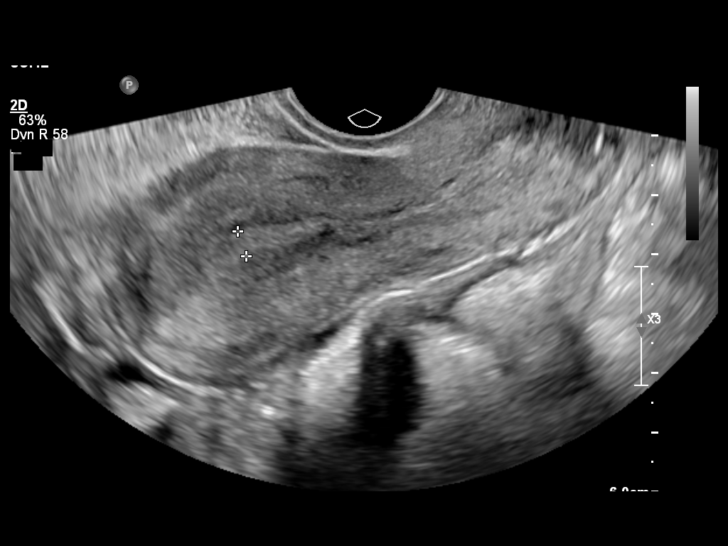
[im 6/34]
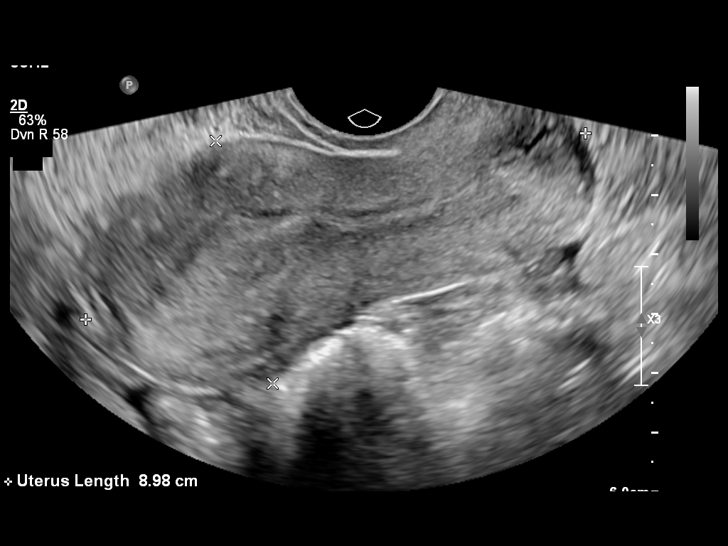
[im 9/34]
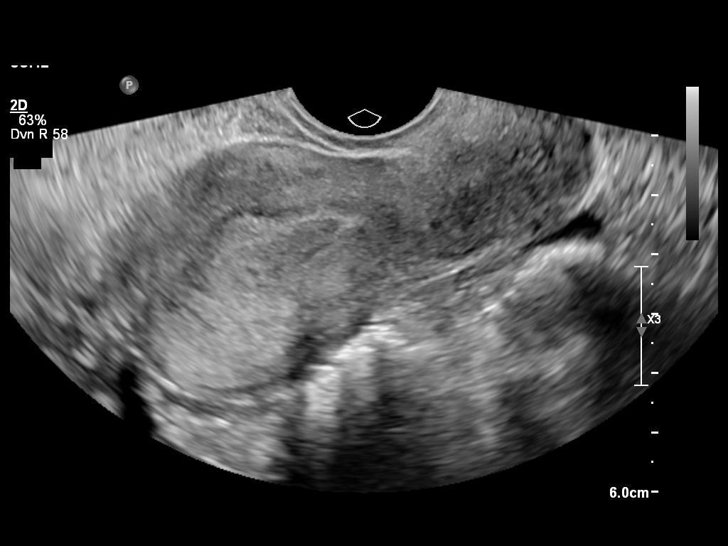
[im 12/34]
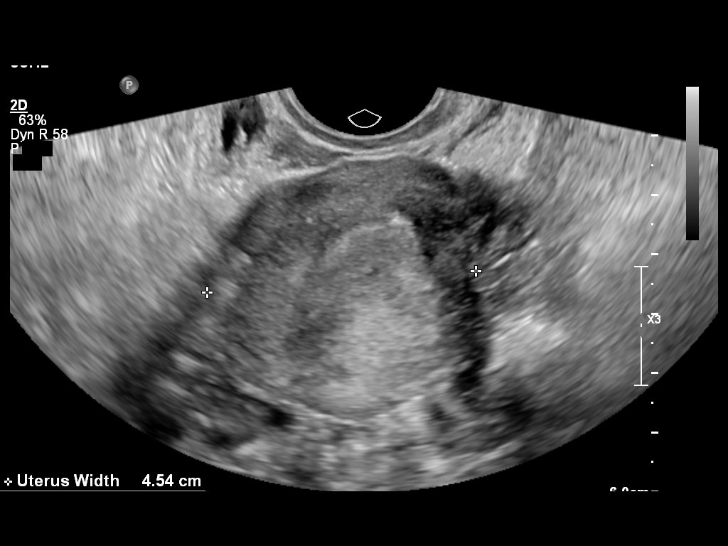
[im 13/34]
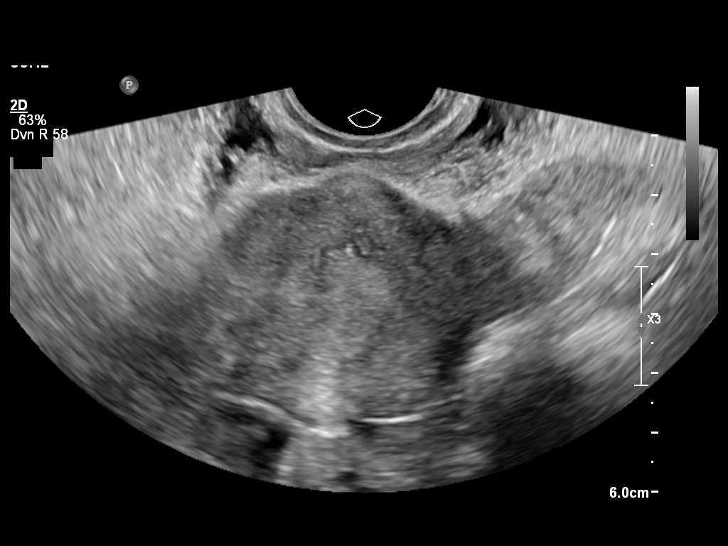
[im 16/34]
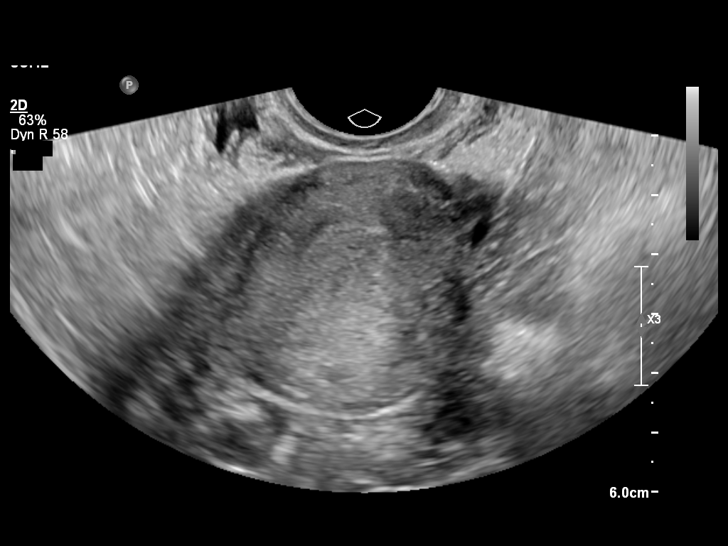
[im 18/34]
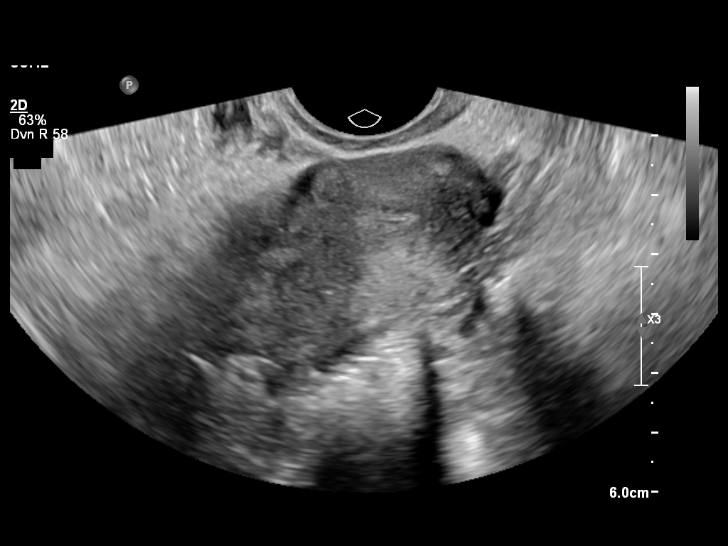
[im 21/34]
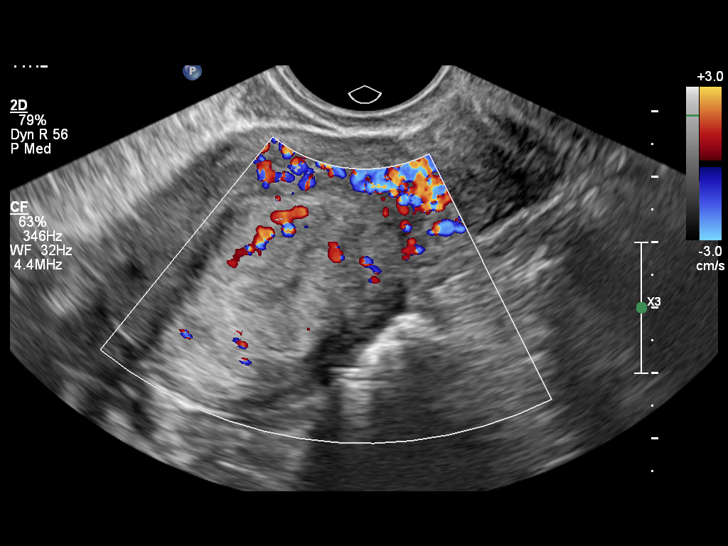
[im 23/34]
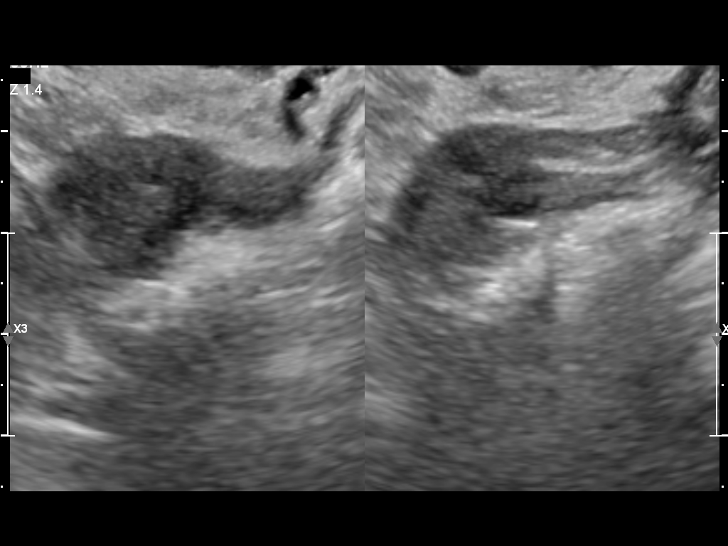
[im 25/34]
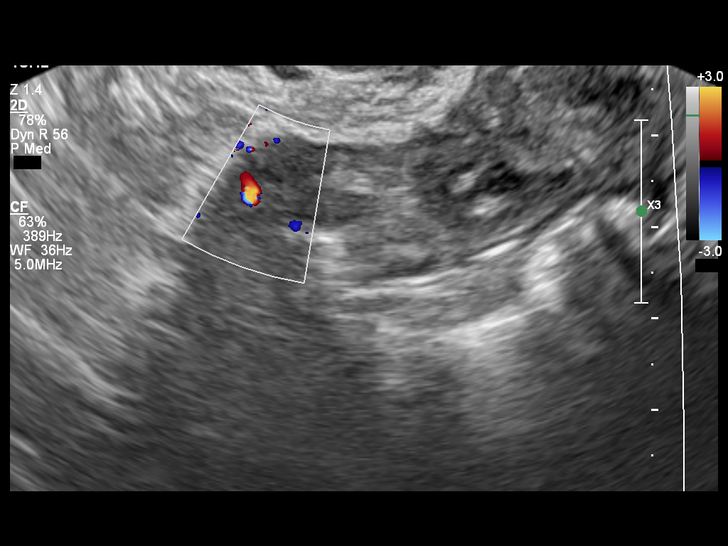
[im 28/34]
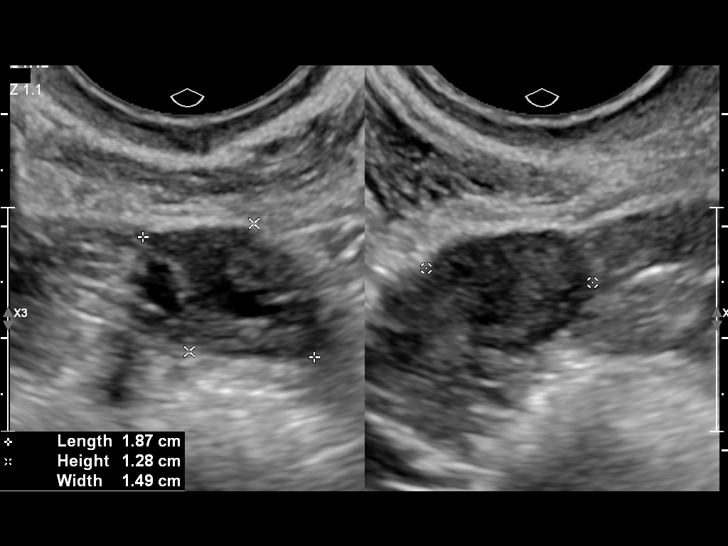
[im 31/34]
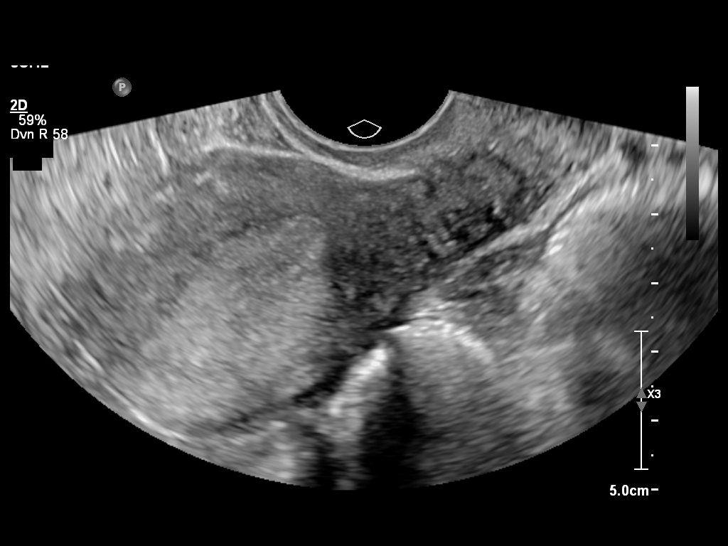
[im 34/34]
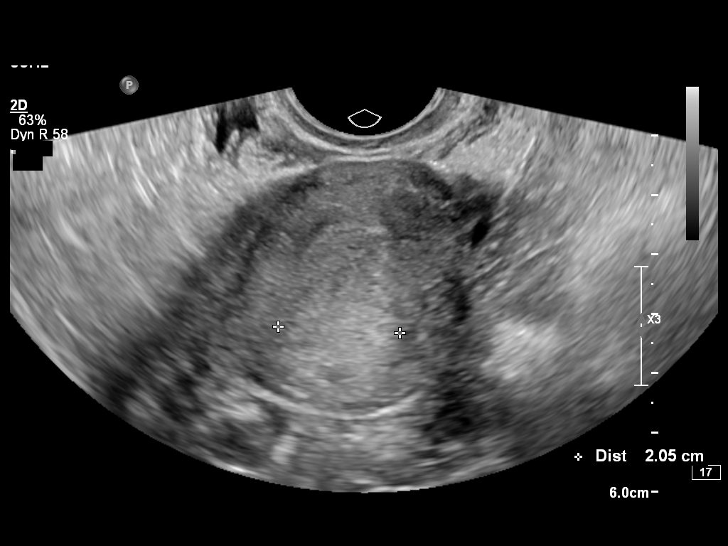

[14 of 25 positions shown; findings below may reference images not displayed]

## 2023-02-27 IMAGING — MG MAMMO BREAST DIAGNOSTIC TOMOSYNTHESIS BILATERAL
8 of 17 series · 8 of 37 positions shown · non-contrast
Comparison: Mammograms dating back to 03/01/2021

This is a summary report. The complete report is available in the patient's medical record. If you cannot access the medical record, please contact the sending organization for a detailed fax or copy.
FINAL REPORT:
PROCEDURE(S): MAMMO BREAST DIAGNOSTIC TOMOSYNTHESIS 3D BILATERAL, BREAST ULTRASOUND LEFT LIMITED
CLINICAL HISTORY: Age:  36 years . Gender:  Female.
Additional history: Painful lump/thickening close in the axilla of the left breast. This is been ongoing for approximately 2 months.
TECHNIQUE: Full field digital diagnostic mammogram with CAD. Tomosynthesis and left breast ultrasound was also performed.

[R XCCL]
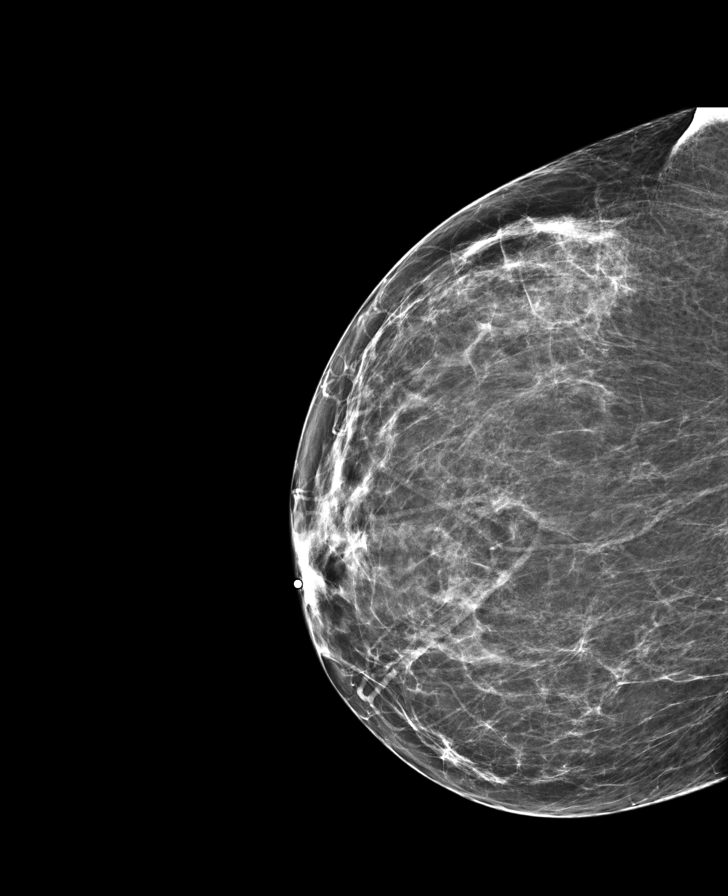

[L XCCL]
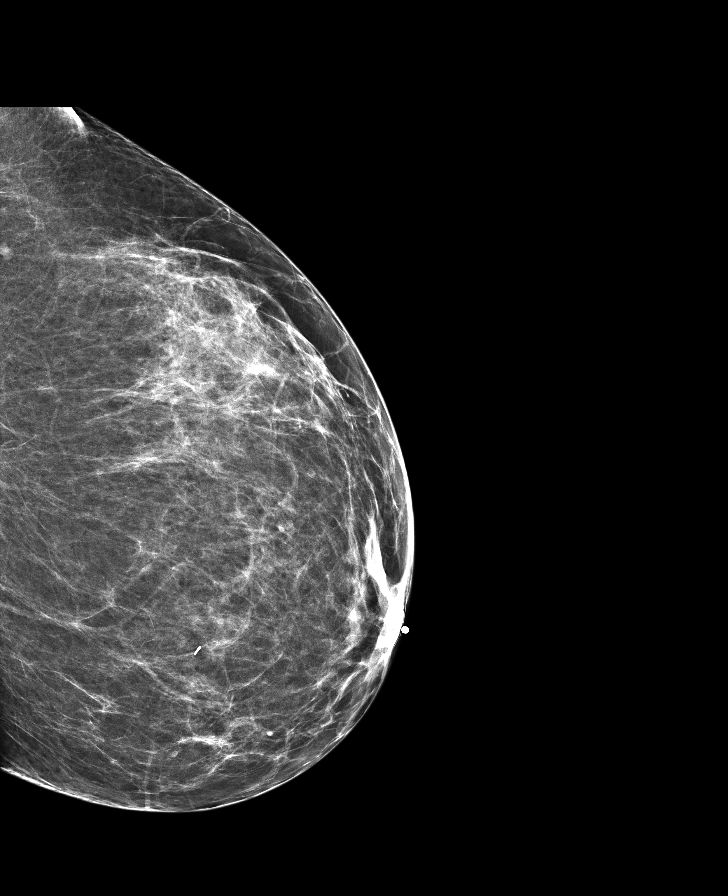

[L CC]
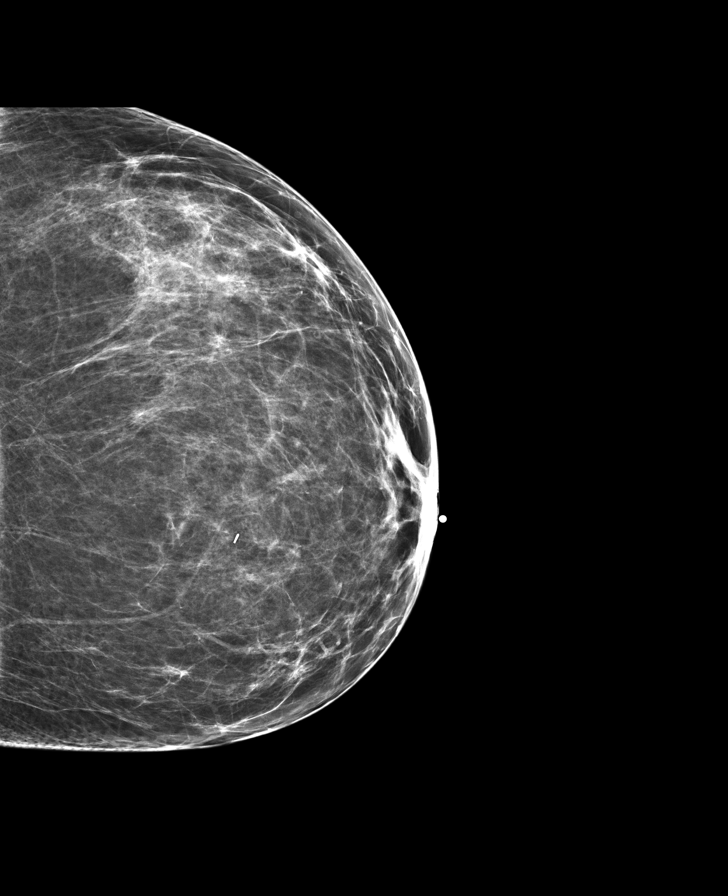

[L MLO]
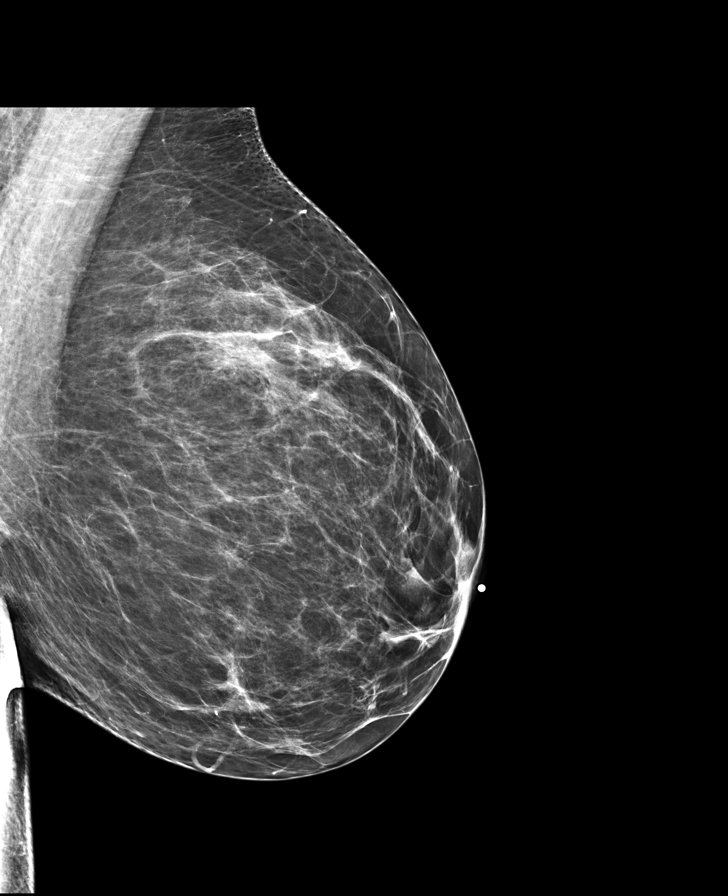

[L ML synth-2D]
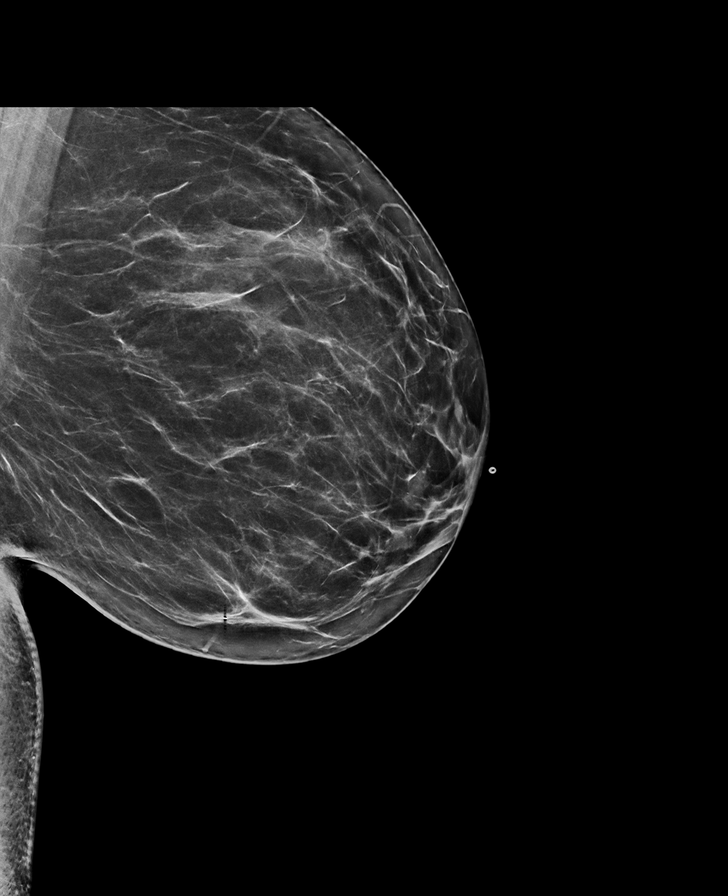

[R MLO synth-2D]
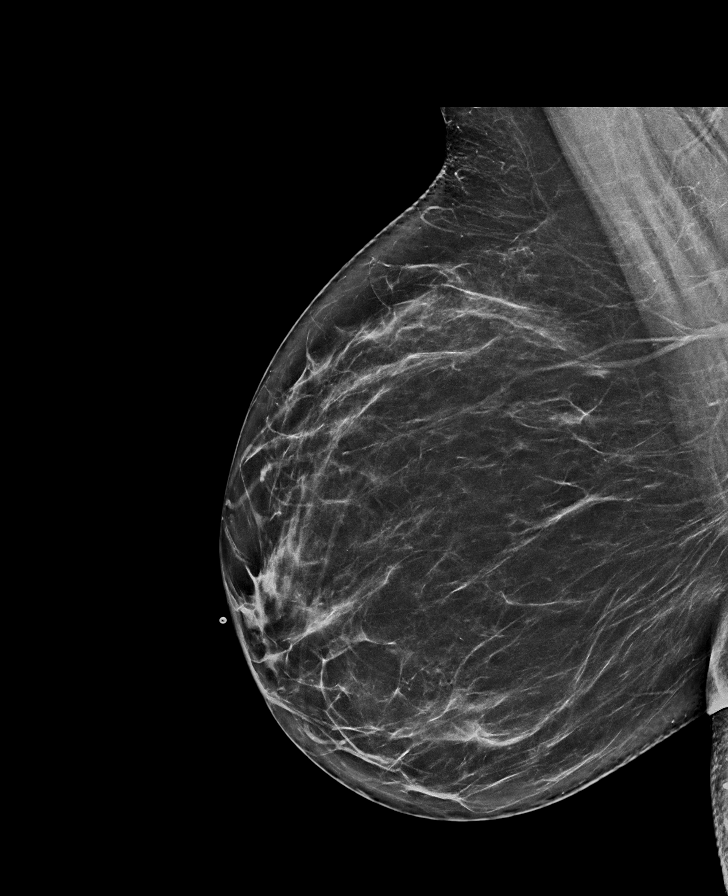

[R CC synth-2D]
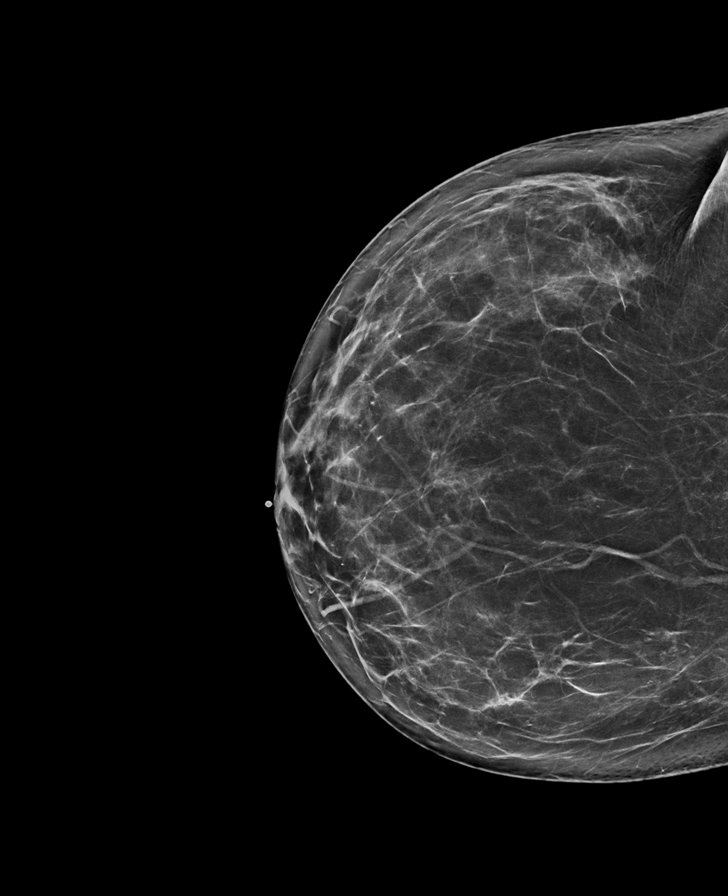

[L MLO synth-2D]
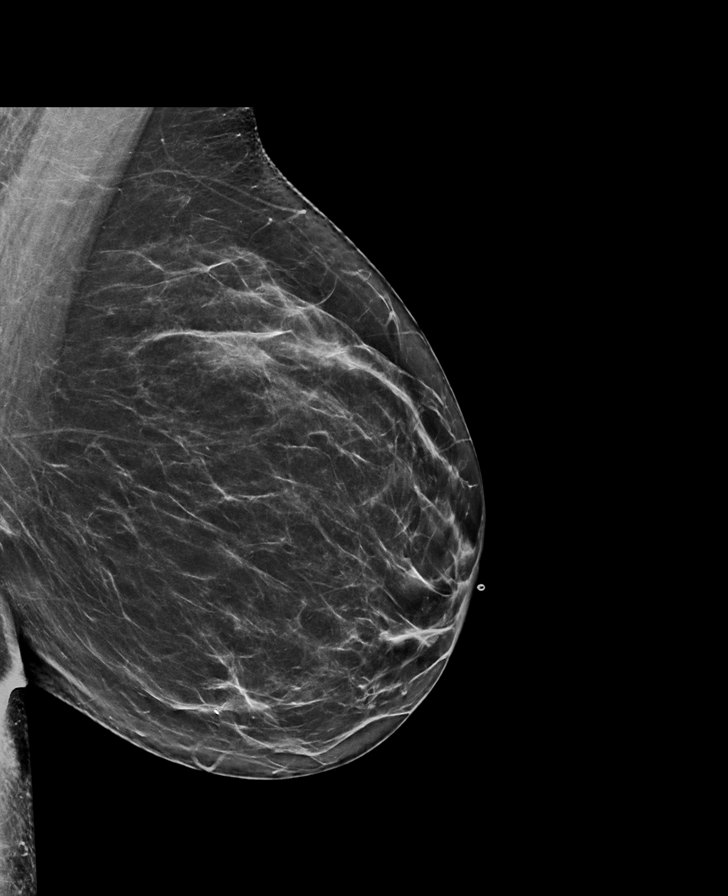

[8 of 37 positions shown; findings below may reference images not displayed]

FINDINGS: Right breast:
There are scattered areas of fibroglandular density.  This may lower the sensitivity of mammography.There are no suspicious masses, calcifications, or areas of architectural distortion in the right breast.
Left breast:
There are scattered areas of fibroglandular density.  This may lower the sensitivity of mammography.There are no suspicious masses, calcifications, or areas of architectural distortion in the left breast.
Focused sonogram of the left breast was performed. This shows:
1.  No sonographic abnormality is identified at the site of concern.
IMPRESSION: Right breast:
1.  No mammographic or sonographic evidence of malignancy
Left breast:
1.  No mammographic evidence of malignancy
Overall Breast Density: Scattered fibroglandular tissue.
BI-RADS 2, benign findings.
Recommend routine annual mammography unless otherwise clinically indicated. Patient may return for repeat imaging if symptoms persist or any concerning symptoms develop.
One must recognize that there is as high as 10% false negative rate on a single screening mammogram. Current recommendations are for a baseline screening mammogram between ages 35-40 and annual mammography after 40.
The patient will be entered into a reminder system with a target due date for the next mammogram.
Is the patient pregnant?
No

## 2023-03-12 IMAGING — CT CT ABDOMEN PELVIS WITH CONTRAST
2 of 3 series · 17 of 46 positions shown, 19 images · IV contrast (agent unspecified)
Comparison: 09/12/2022

Lower abdomen pain after her son fell onto her abdomen
Hx of hysterscopy, appendectomy
No hx of cancer
FINAL REPORT:
EXAM: CT ABDOMEN AND PELVIS WITH CONTRAST
INDICATION: abd pain after her son fell on her abd. . .
TECHNIQUE: Contiguous axial images were obtained through the abdomen and pelvis with intravenous contrast. Sagittal and coronal reformatted images were generated.
All CT scans at this facility use dose modulation and/or weight based dosing when appropriate to reduce radiation dose to as low as reasonably achievable.

[Series 2: abdomen/pel with · axial · 0.78mm/px · z∈[-460,-20]mm · 14 of 204 slices shown, 16 images]
[im 14/204  soft-tissue]
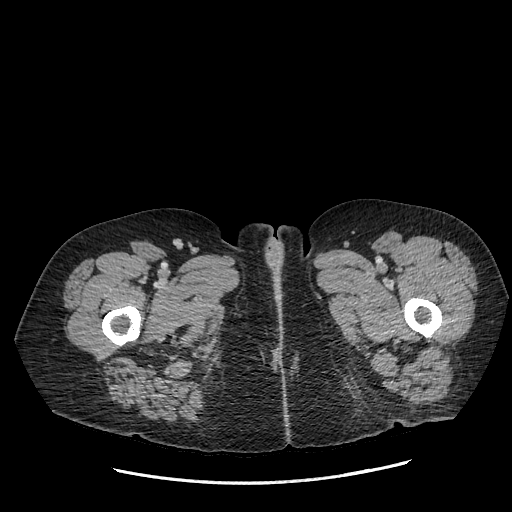
[im 14/204  bone]
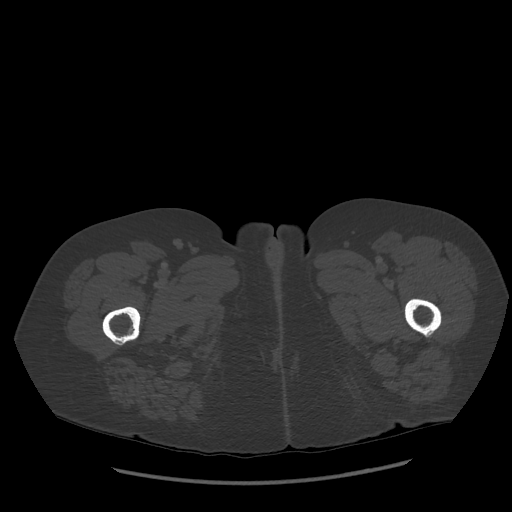
[im 27/204  soft-tissue]
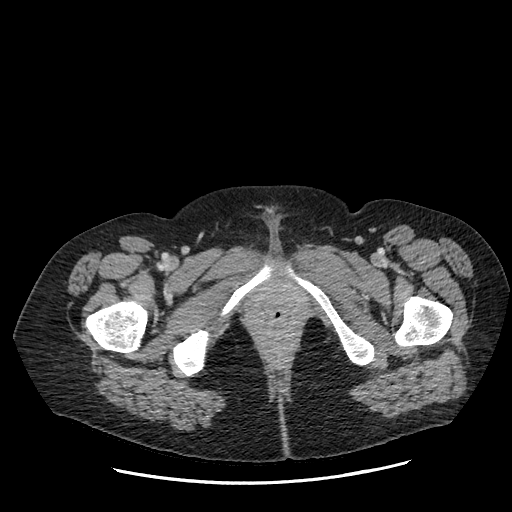
[im 40/204  soft-tissue]
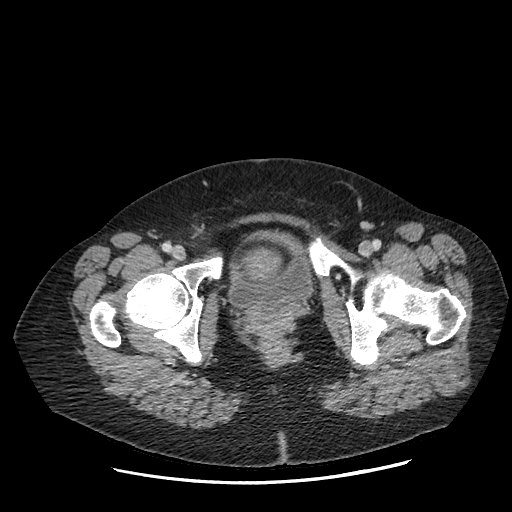
[im 53/204  soft-tissue]
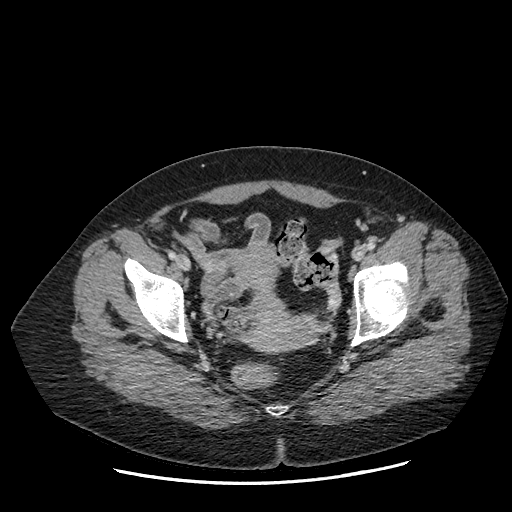
[im 66/204  soft-tissue]
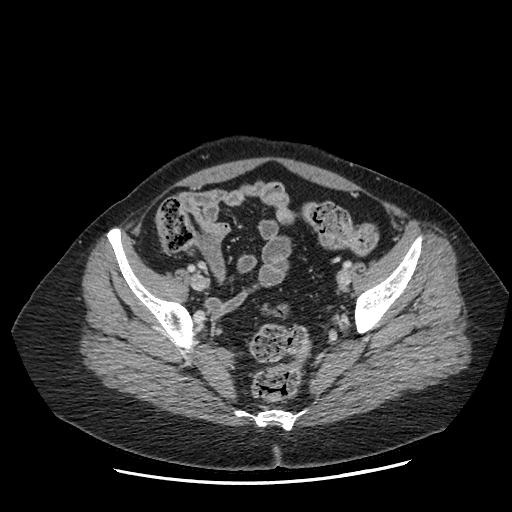
[im 79/204  soft-tissue]
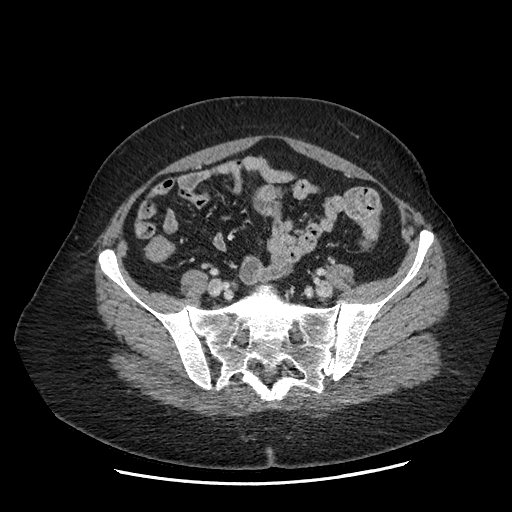
[im 92/204  soft-tissue]
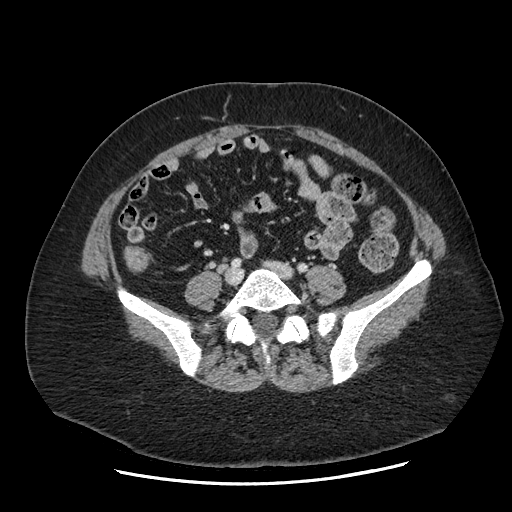
[im 112/204  soft-tissue]
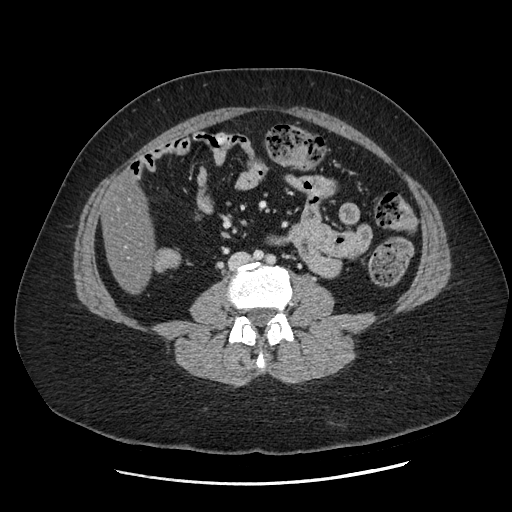
[im 125/204  soft-tissue]
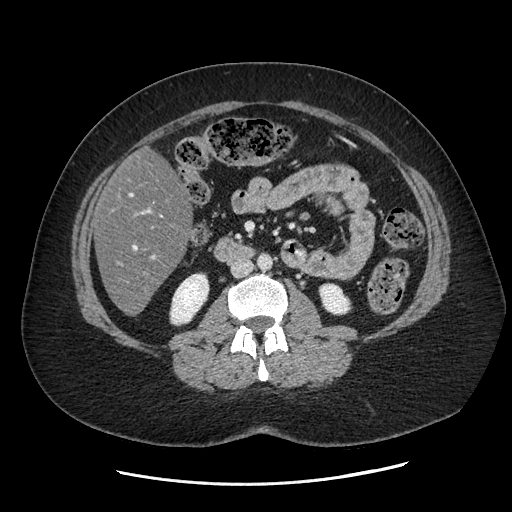
[im 125/204  bone]
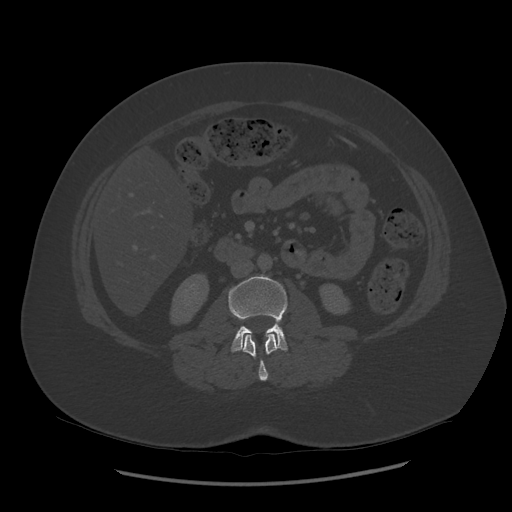
[im 138/204  soft-tissue]
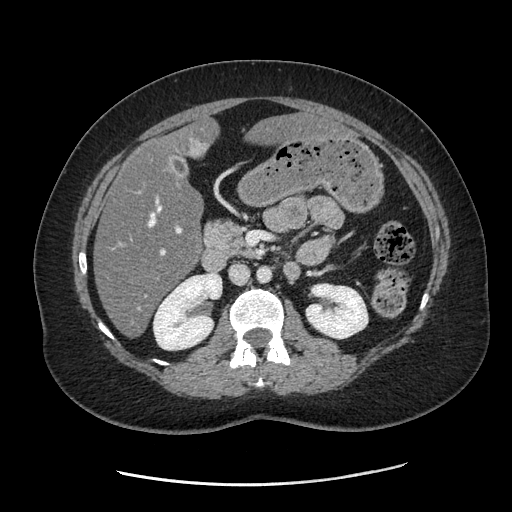
[im 151/204  soft-tissue]
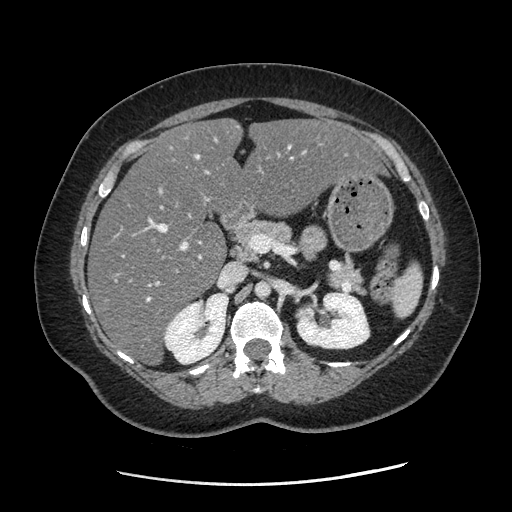
[im 164/204  soft-tissue]
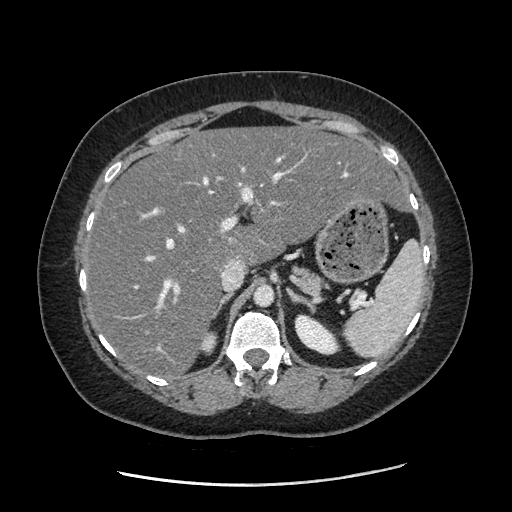
[im 177/204  soft-tissue]
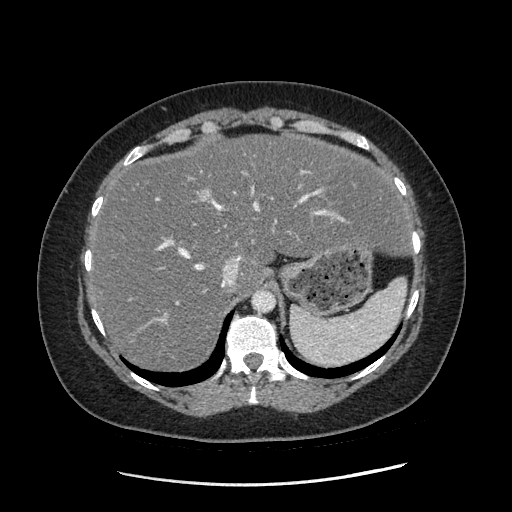
[im 190/204  soft-tissue]
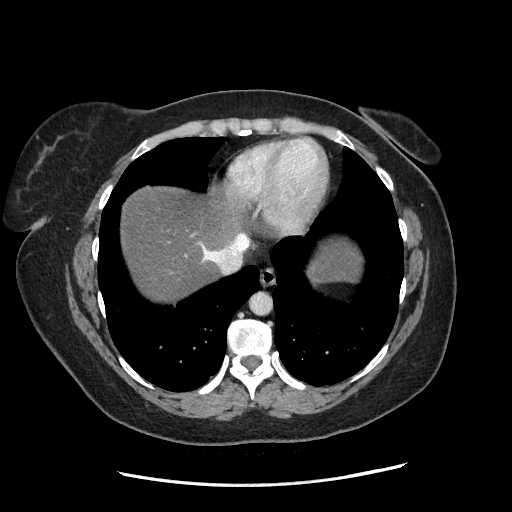

[Series 602: sag standard 2x2 · sagittal · 0.99mm/px · 3 of 198 slices shown]
[im 66/198  soft-tissue]
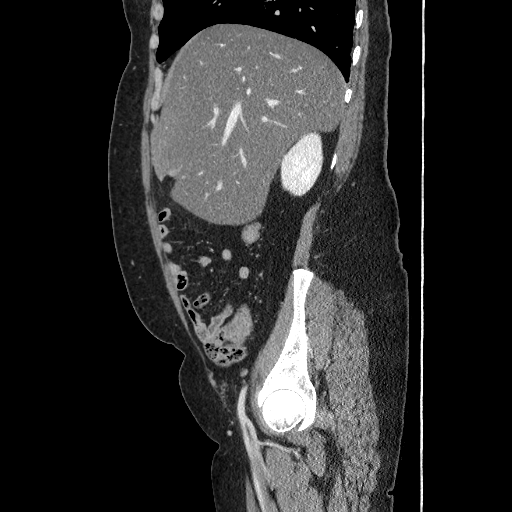
[im 88/198  soft-tissue]
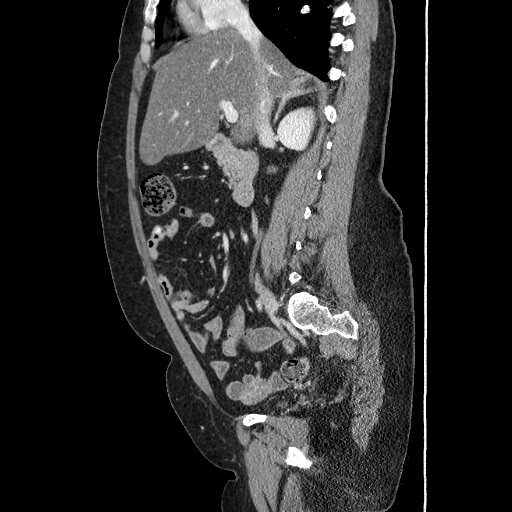
[im 110/198  soft-tissue]
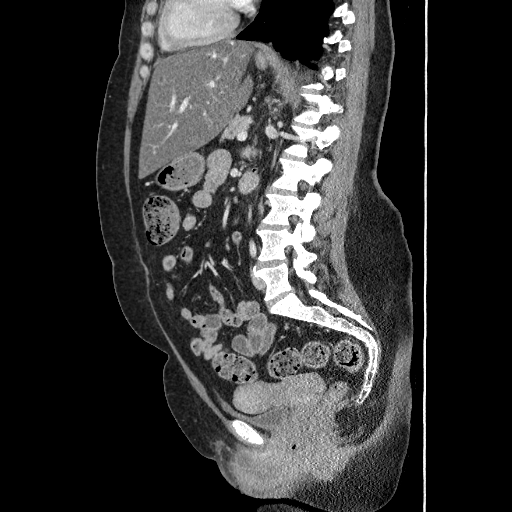

[17 of 46 positions shown; findings below may reference images not displayed]

FINDINGS: Lung bases are clear. 4 cm, and decreased attenuation, consistent with hepatic steatosis.
Gallbladder, spleen, pancreas, adrenal glands are unremarkable.
Kidneys demonstrate normal size and contour as well as symmetric enhancement. No hydronephrosis.
No bowel wall thickening or dilation.
Appendix is not reliably demonstrated, likely surgically absent given surgical clips at the cecum.
Pelvic organs are unremarkable. No free fluid.
Mild degenerative disease in the lumbar spine. No acute fractures..
IMPRESSION: 
IMPRESSION: 1. No acute infectious or inflammatory process in the abdomen or pelvis.
2. Hepatomegaly with hepatic steatosis.

## 2023-03-26 IMAGING — CR XR CHEST 1 VIEW
1 series · 1 of 1 positions shown · non-contrast
Comparison: 10/13/2022

SOB
FINAL REPORT:
Chest portable one view
INDICATION: chest pain, SOB

[PA]
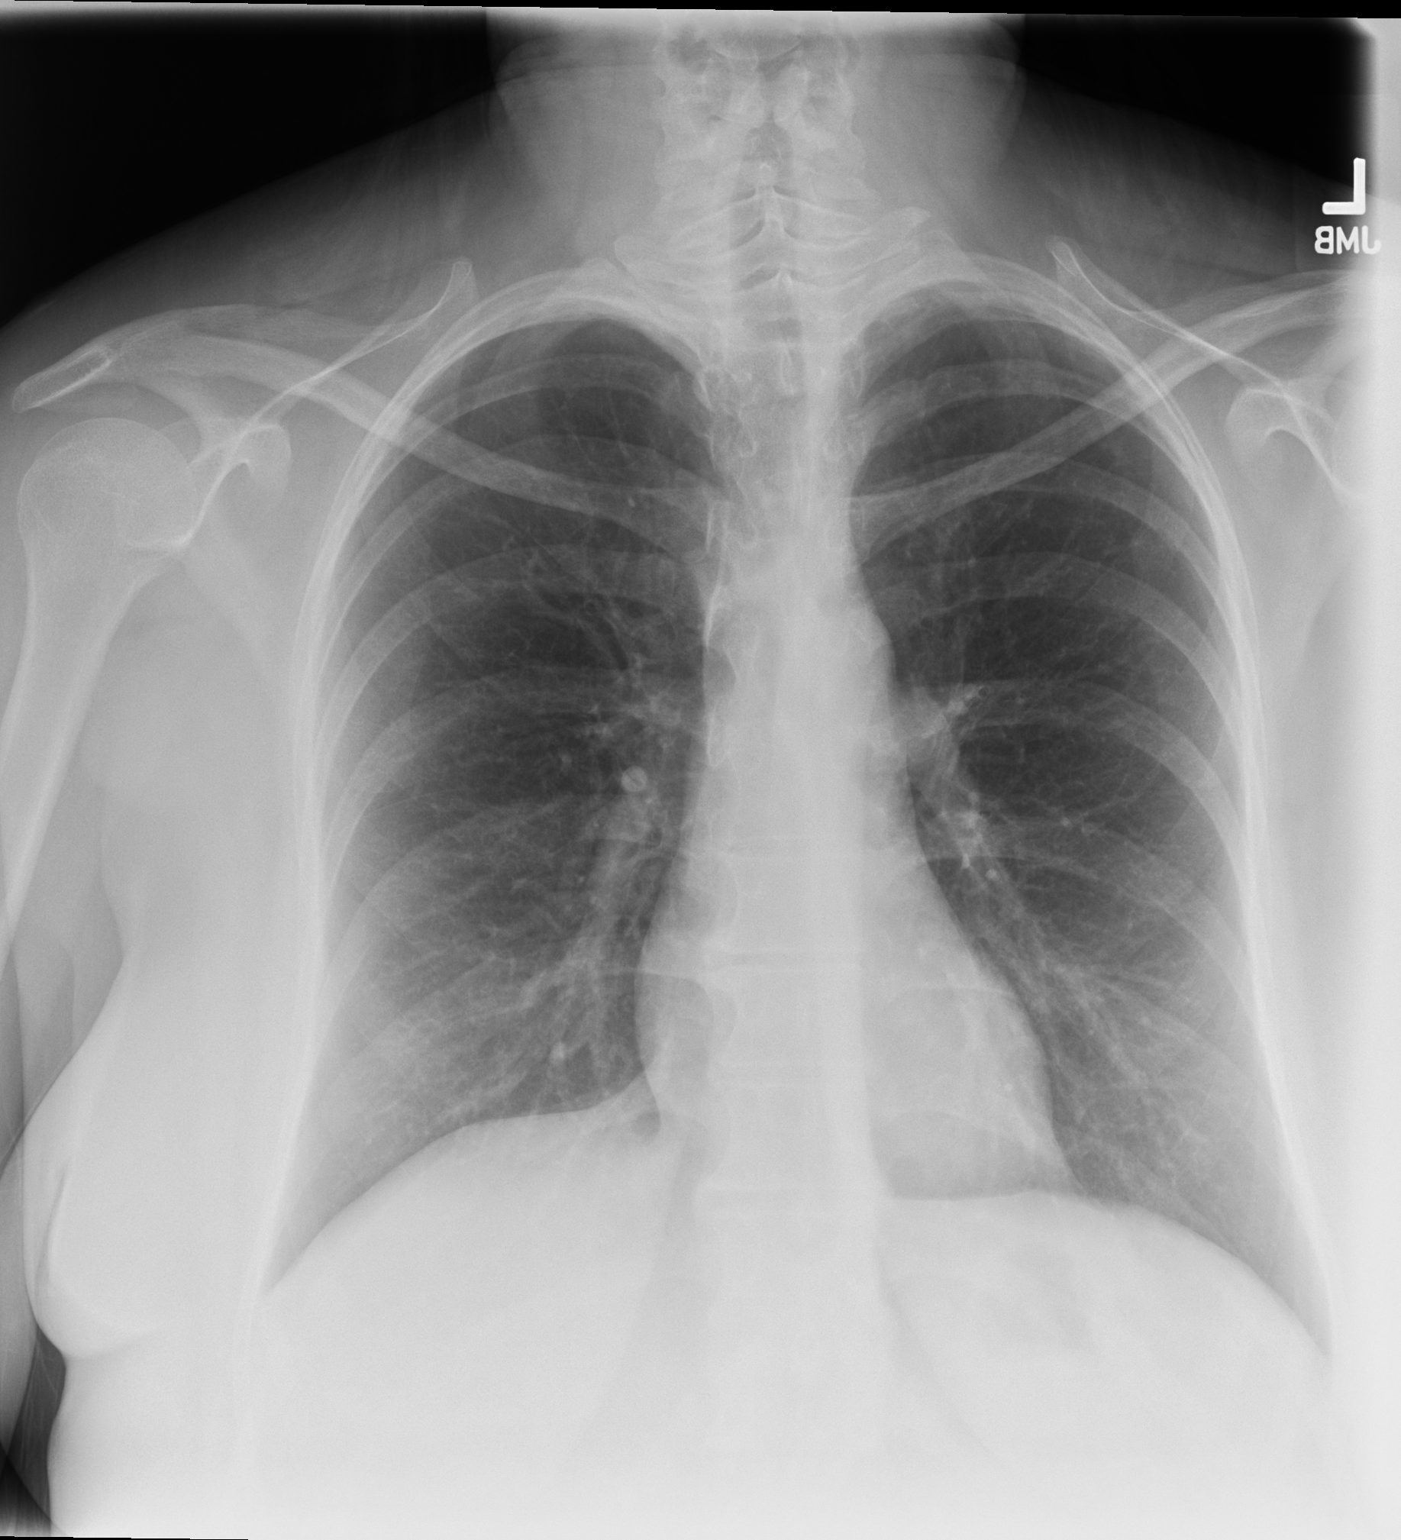

[1 of 1 positions shown; findings below may reference images not displayed]

FINDINGS: The cardiac silhouette is within normal limits   given portable technique.  The lungs are clear.   No pneumothorax. No pleural effusion.   No acute osseous abnormalities.
IMPRESSION: 
IMPRESSION: No acute cardiopulmonary process.
Is the patient pregnant?
Unknown

## 2023-06-20 IMAGING — CR XR CHEST 1 VIEW
1 series · 1 of 1 positions shown · non-contrast
Comparison: 03/26/2023

FINAL REPORT:
Chest portable one view
INDICATION: sob

[AP]
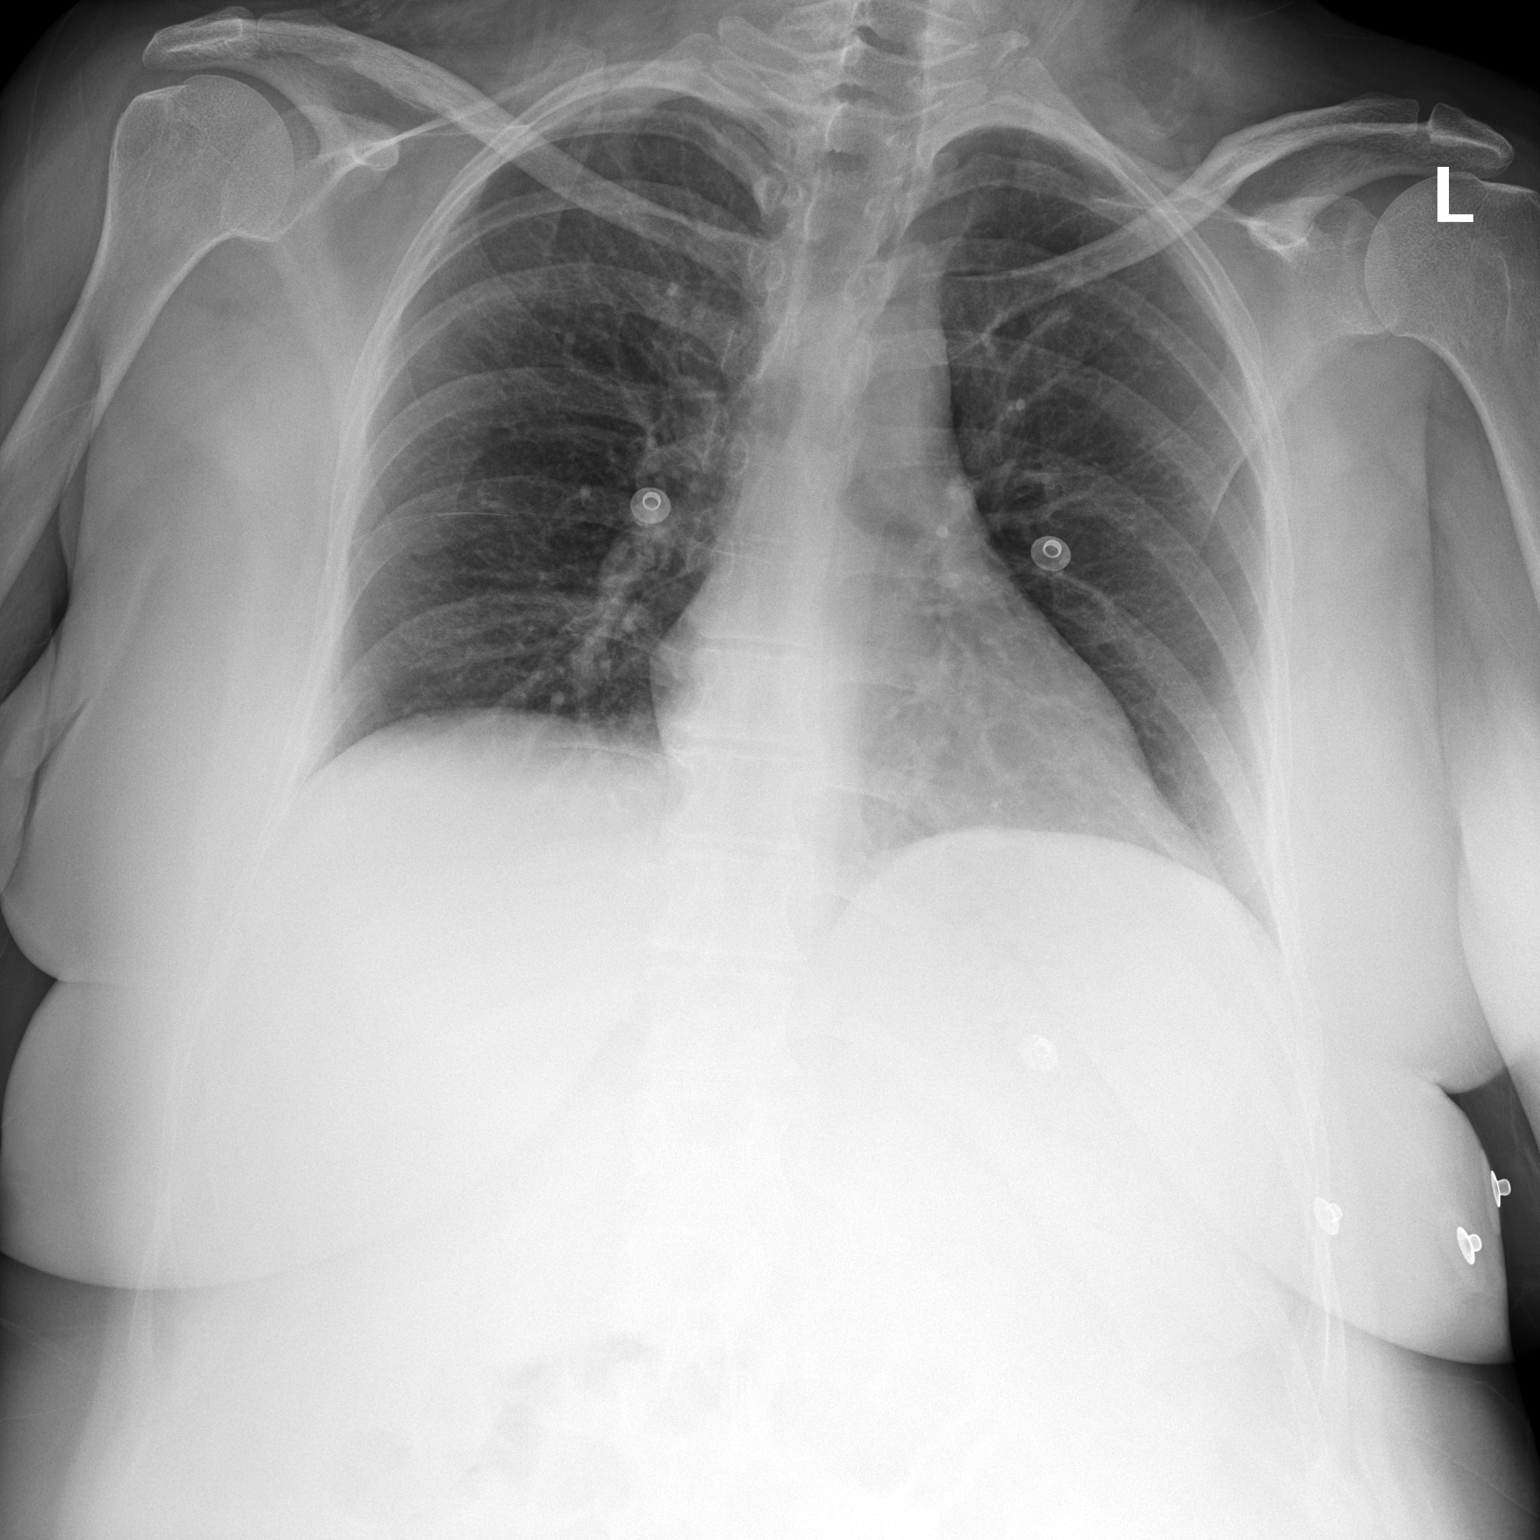

[1 of 1 positions shown; findings below may reference images not displayed]

FINDINGS: The cardiac silhouette is within normal limits.  The lungs are clear.   No pneumothorax. No pleural effusion.   No acute osseous abnormalities.
IMPRESSION: 
IMPRESSION: No acute cardiopulmonary process.
Is the patient pregnant?
No

## 2023-06-20 IMAGING — CT CT ABDOMEN PELVIS WITH CONTRAST
2 of 5 series · 17 of 46 positions shown, 19 images · non-contrast
Comparison: 03/12/2023
COMMENT:  Unless the patient's specific circumstances suggest otherwise, any liver lesion 0.5 cm or less, any cystic kidney lesion less than 1.0 cm, and/or any adrenal lesion 1.0 cm or less not otherwise characterized in this report as possessing suspicious or indeterminate imaging features is/are most likely to be benign and do not require follow-up imaging or biopsy.

Abdominal pain.
FINAL REPORT:
CT ABDOMEN PELVIS WITHOUT CONTRAST
TECHNIQUE: CT images through the abdomen and pelvis were obtained with intravenous contrast. [id number] was injected without complications.
All CT scans at this facility use dose modulation and/or weight based dosing when appropriate to reduce radiation dose.
CLINICAL INDICATION:  Abdominal abscess/infection suspected.

[Series 3: thins · axial · 0.70mm/px · z∈[-406,+58]mm · 14 of 510 slices shown, 16 images]
[im 23/510  soft-tissue]
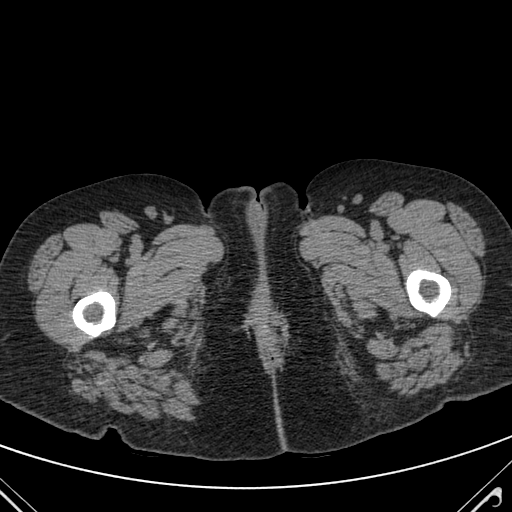
[im 23/510  bone]
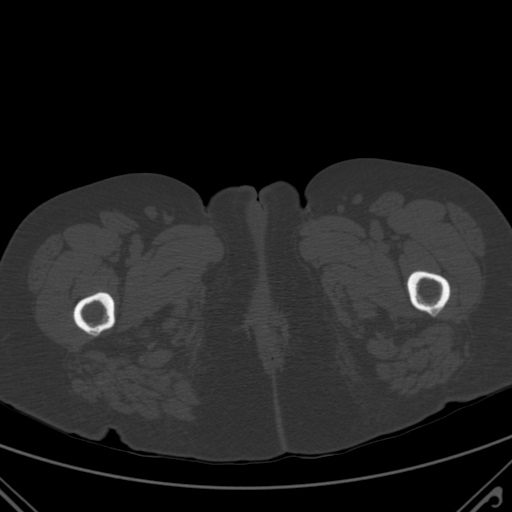
[im 67/510  soft-tissue]
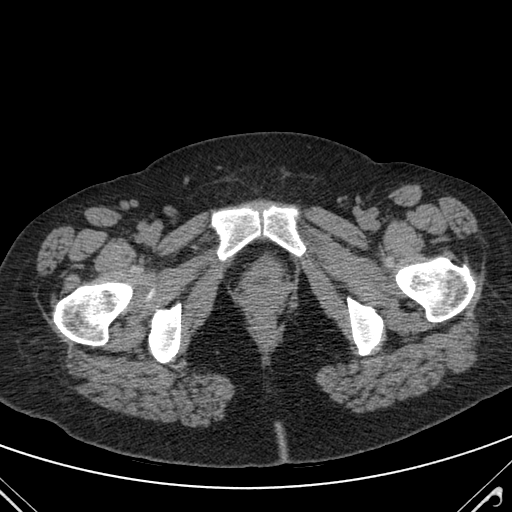
[im 89/510  soft-tissue]
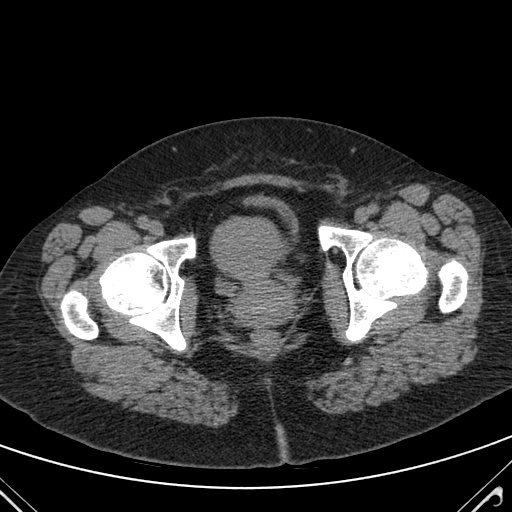
[im 133/510  soft-tissue]
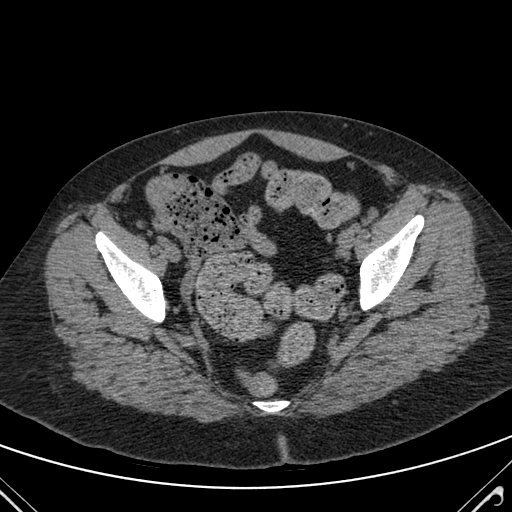
[im 178/510  soft-tissue]
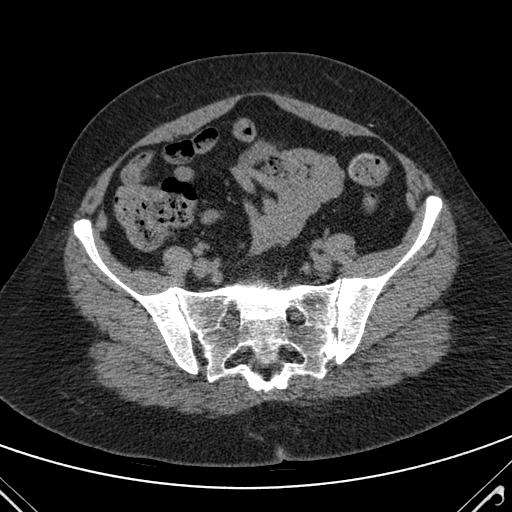
[im 200/510  soft-tissue]
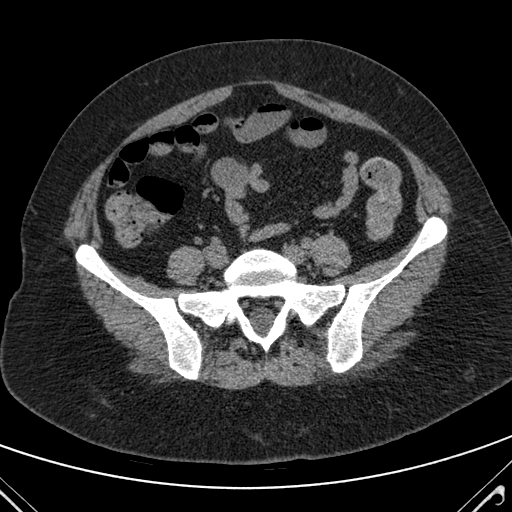
[im 244/510  soft-tissue]
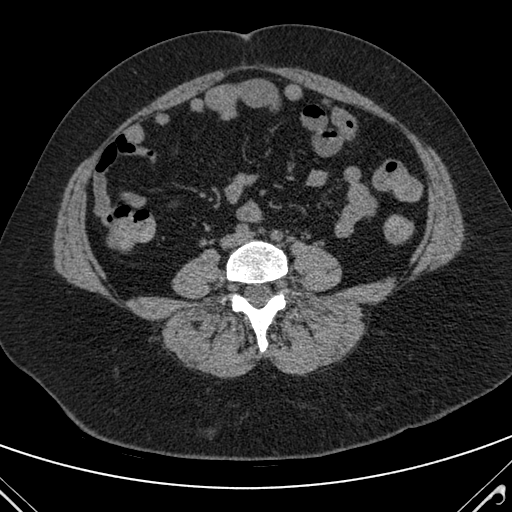
[im 266/510  soft-tissue]
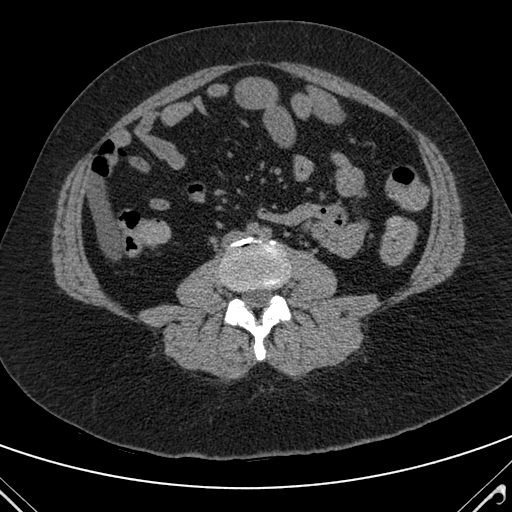
[im 310/510  soft-tissue]
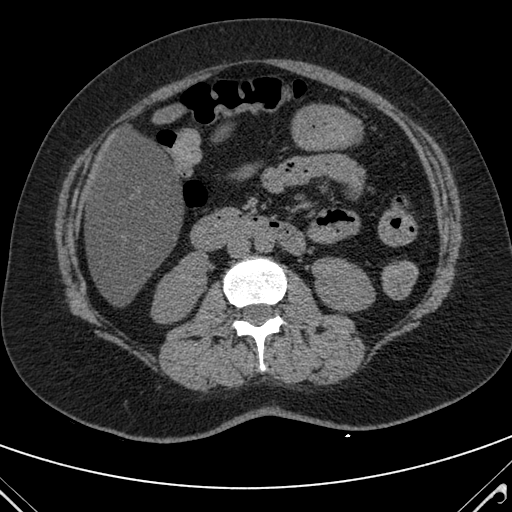
[im 310/510  bone]
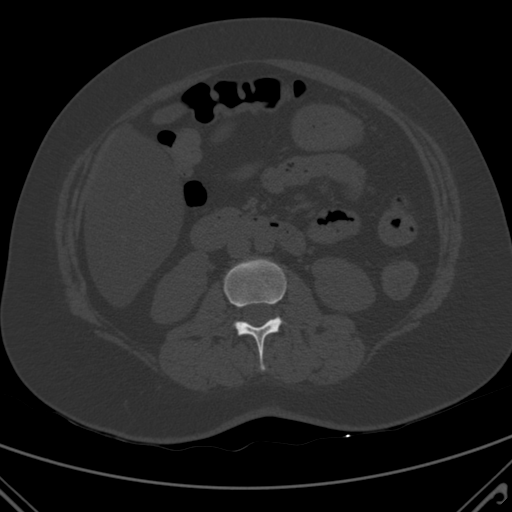
[im 332/510  soft-tissue]
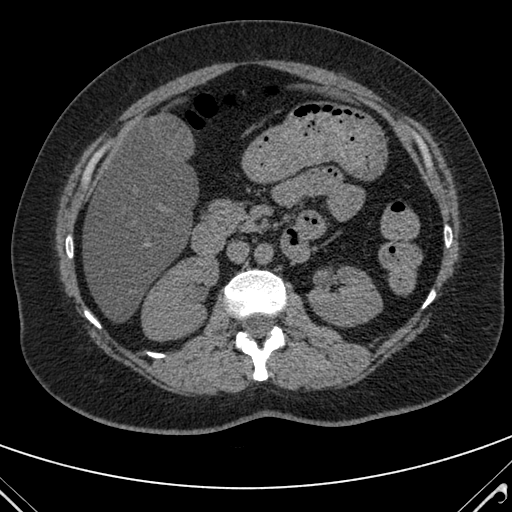
[im 377/510  soft-tissue]
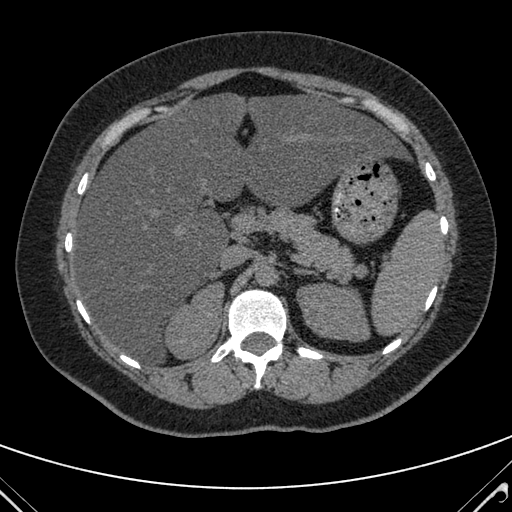
[im 421/510  soft-tissue]
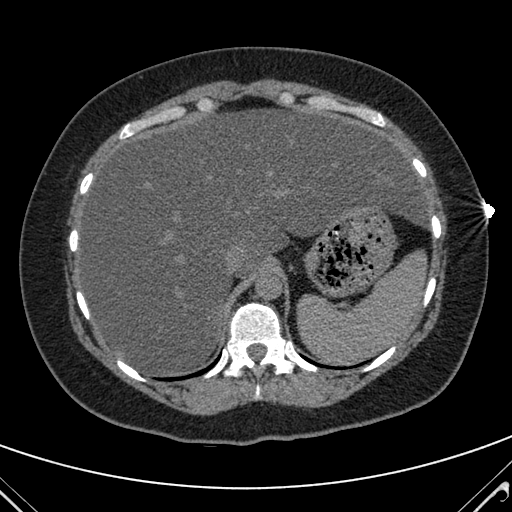
[im 443/510  soft-tissue]
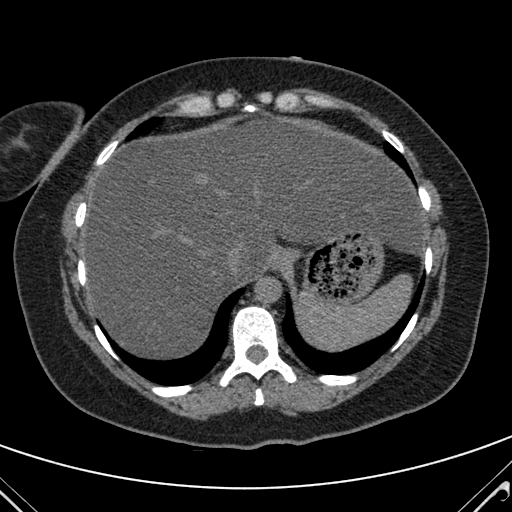
[im 487/510  soft-tissue]
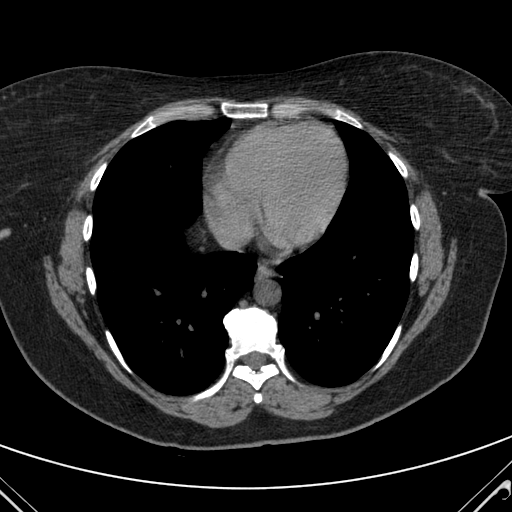

[mpr, coronal, coronal · coronal · 0.99mm/px · 3 of 155 slices shown]
[im 39/155  soft-tissue]
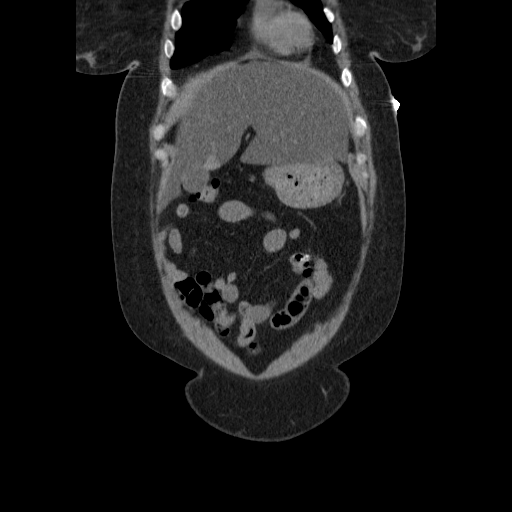
[im 78/155  soft-tissue]
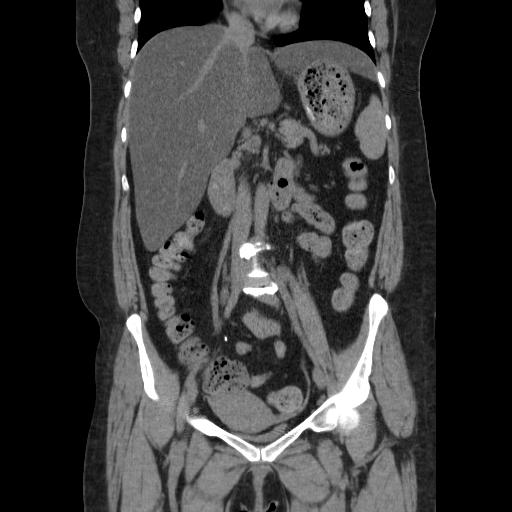
[im 116/155  soft-tissue]
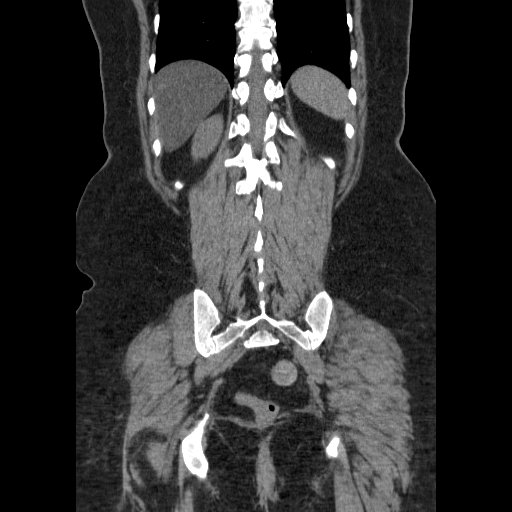

[17 of 46 positions shown; findings below may reference images not displayed]

FINDINGS: Lower Thorax: Normal.
Liver: Diffuse hepatic steatosis.
Gallbladder/Biliary Tree: Normal.
Spleen: Normal.
Pancreas: Normal.
Adrenal Glands: Normal.
Kidneys/Ureters: Normal.
Gastrointestinal: Normal.
Bladder: Normal.
Pelvic Organs:  No acute abnormality.
Lymph Nodes: Normal.
Vessels: Normal.
Peritoneum/Retroperitoneum: Normal.
Bones: No acute osseous findings.
Soft tissues: Normal.
IMPRESSION: 1. No acute abnormality in the abdomen and pelvis.
2. Diffuse hepatic steatosis.
Is the patient pregnant?
No

## 2023-06-21 IMAGING — CR XR KNEE 2 VIEWS RIGHT
1 series · 2 of 2 positions shown · non-contrast
Comparison: Pain, no reported history of trauma
________________________________________________________________________________

FINAL REPORT:
XR KNEE 2 VIEWS RIGHT
CLINICAL INDICATION: pain

[Series 5954: AP · right · 2 of 2 slices shown]
[im 1/2]
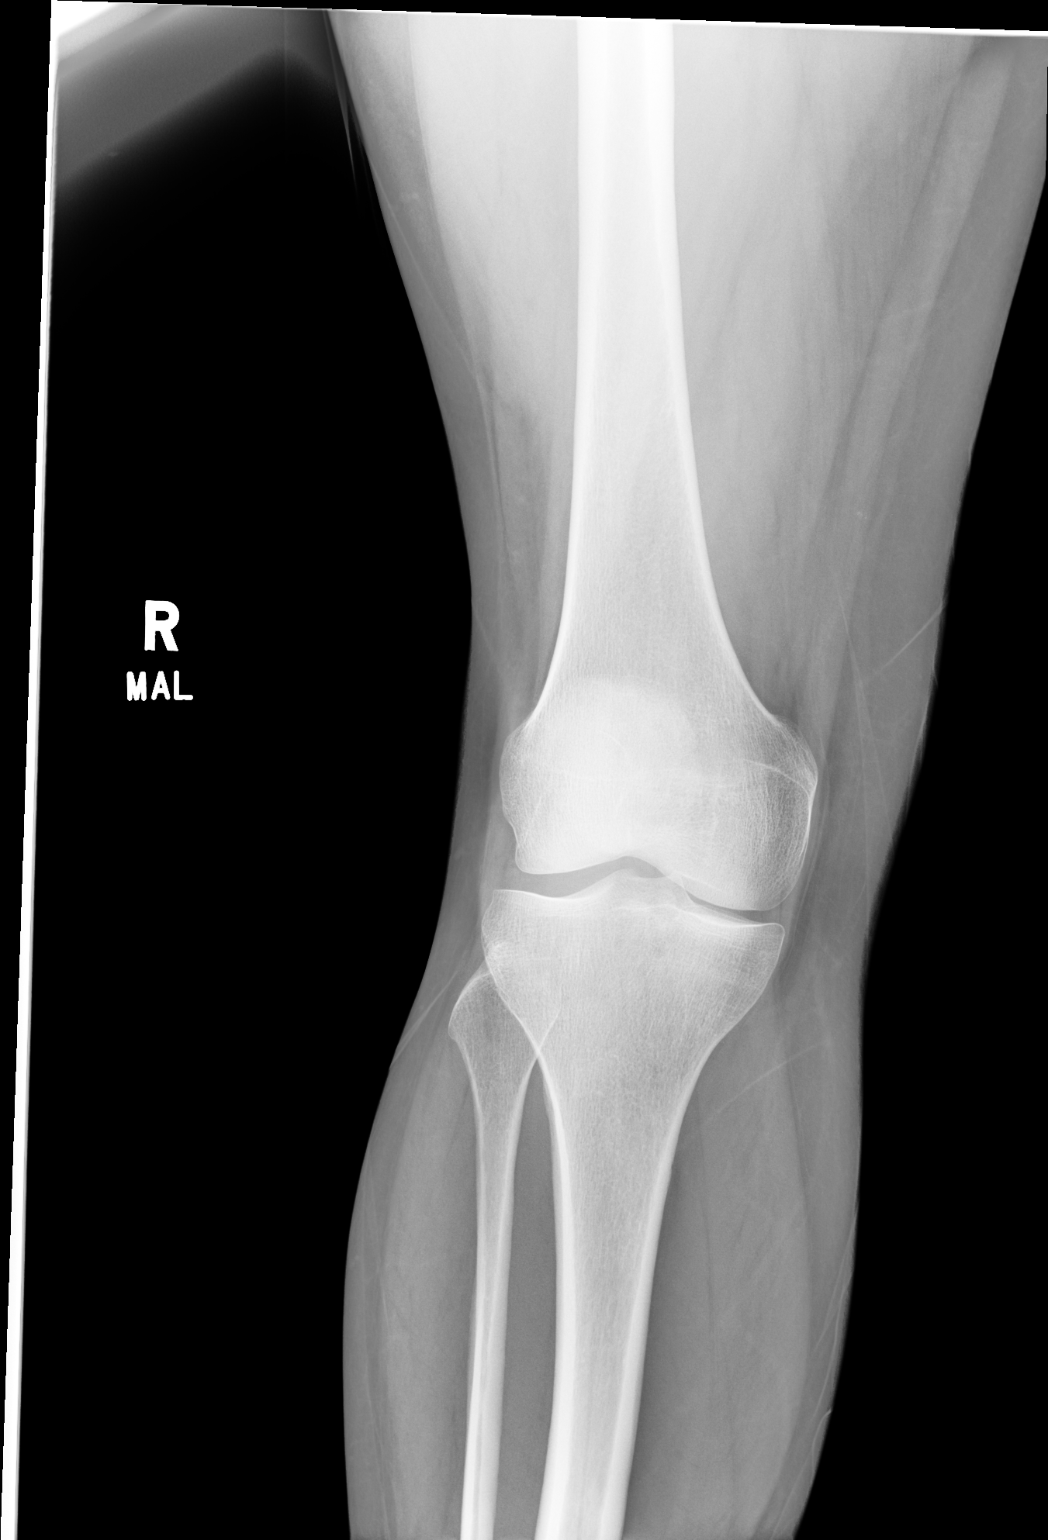
[im 2/2]
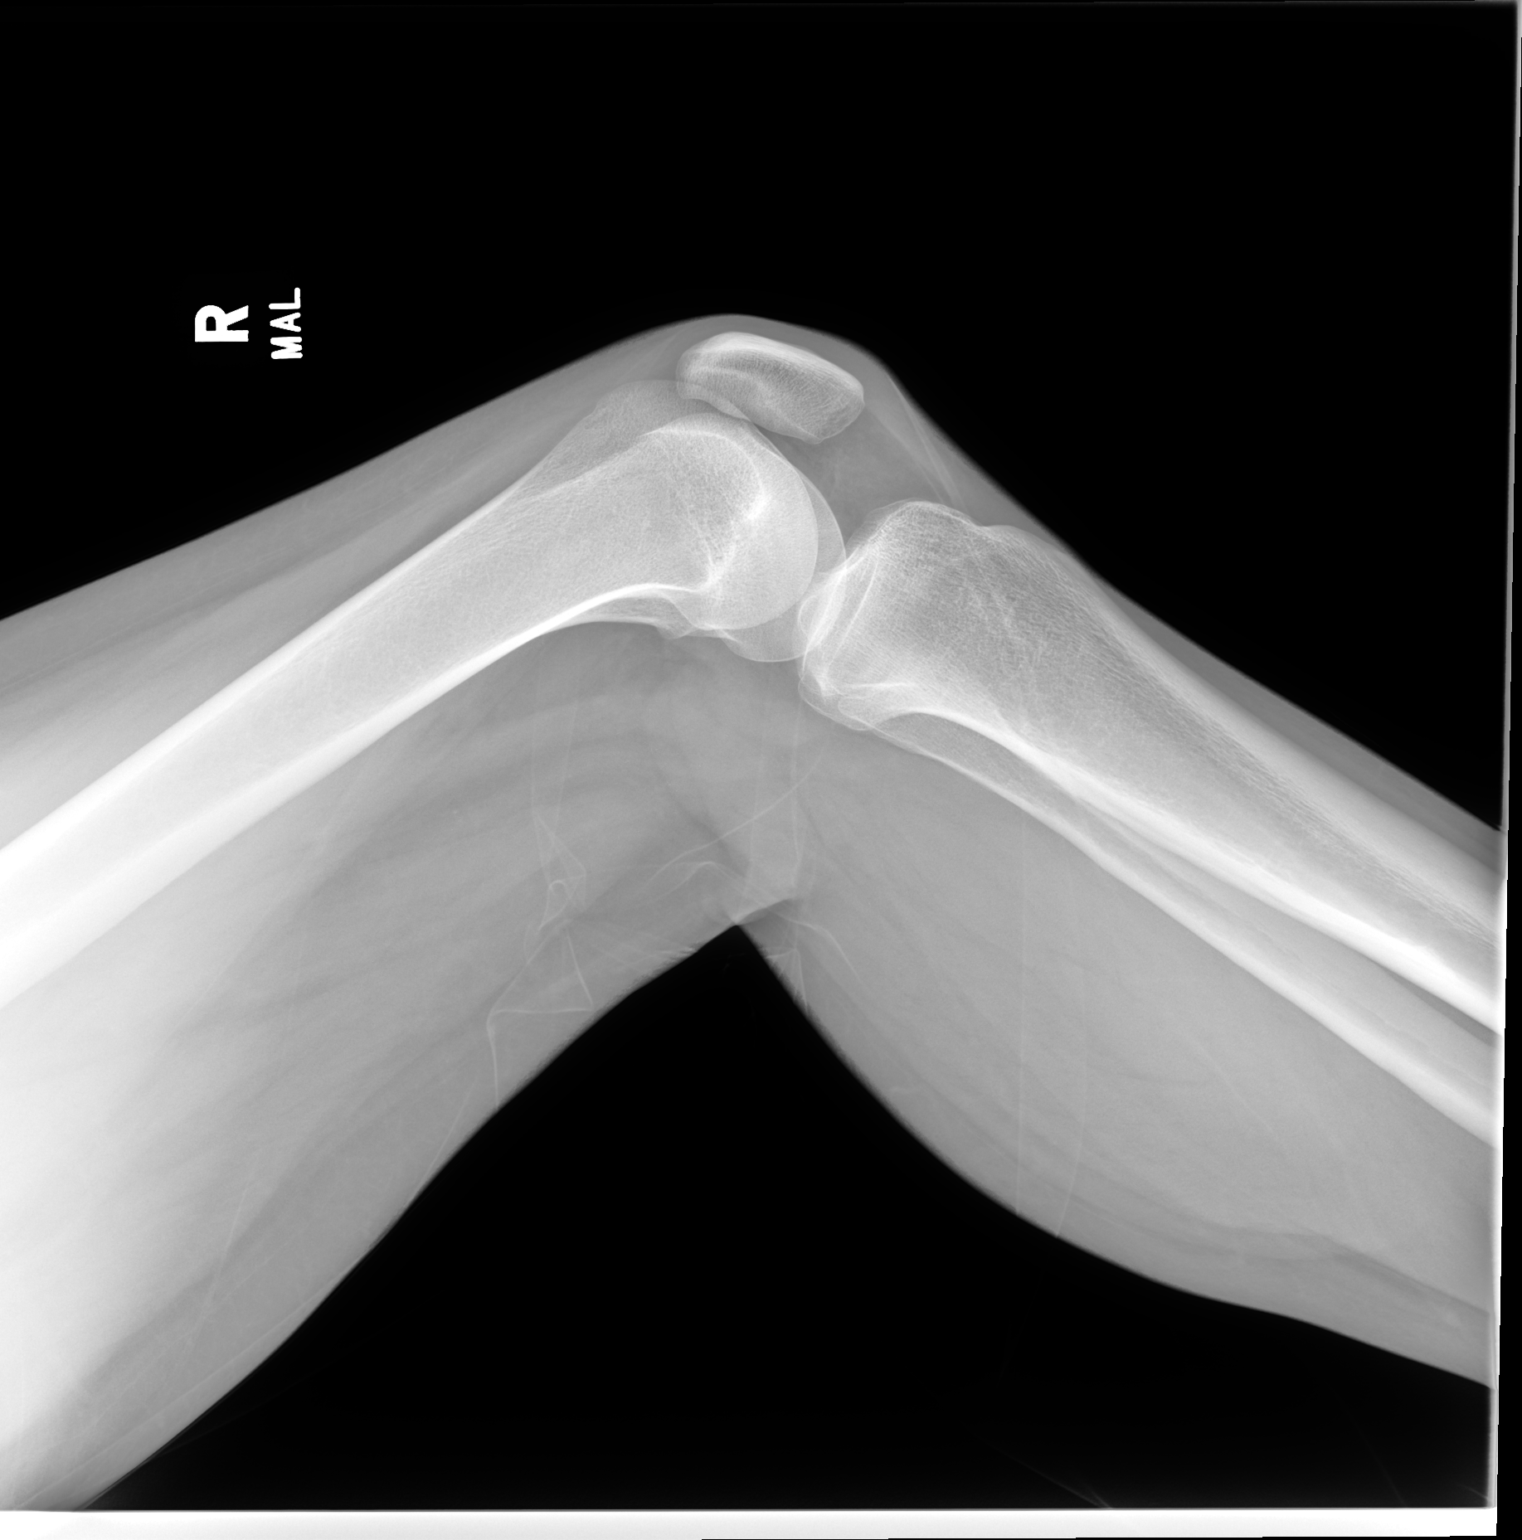

[2 of 2 positions shown; findings below may reference images not displayed]

FINDINGS: *  No acute displaced fracture or dislocation.
*  No suspicious destructive osseous lesion.
*  No radiodense foreign body.
IMPRESSION: No acute osseous abnormality.
________________________________________________________________________________
Is the patient pregnant?
No

## 2023-07-11 IMAGING — CR Chest
2 series · 2 of 2 positions shown · non-contrast
Comparison: none

FINAL REPORT:
HISTORY: Short of breath.
PA and lateral chest:

[left lateral]
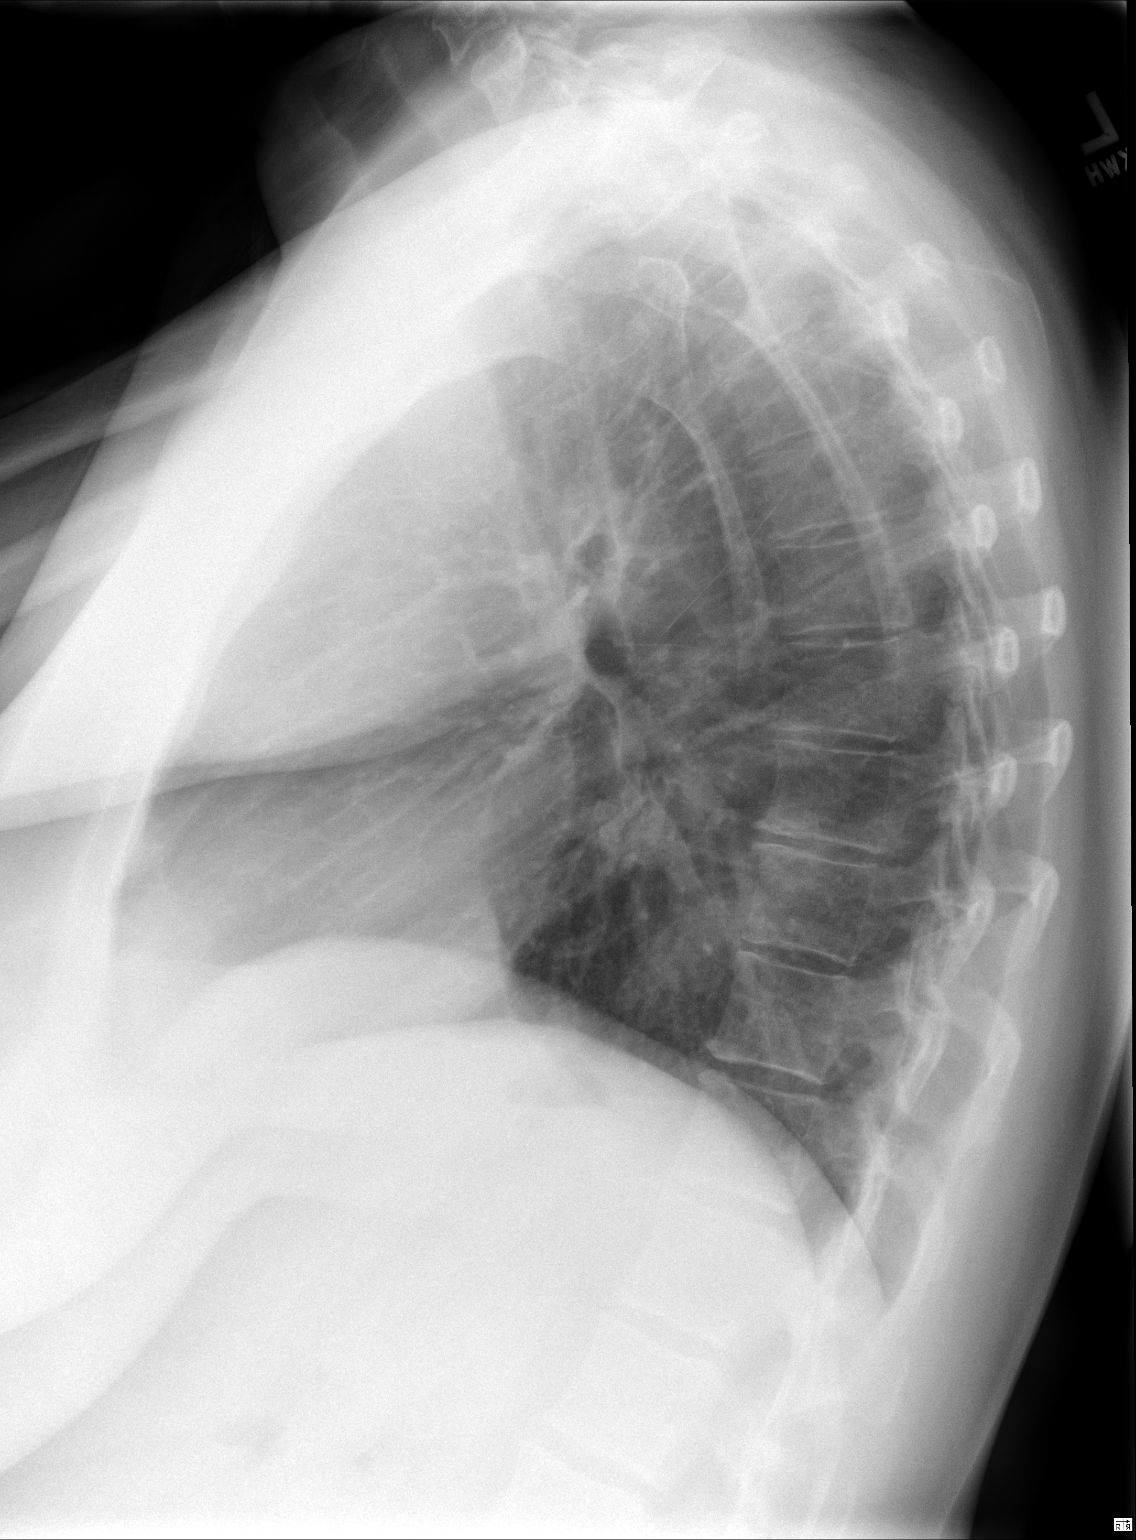

[PA]
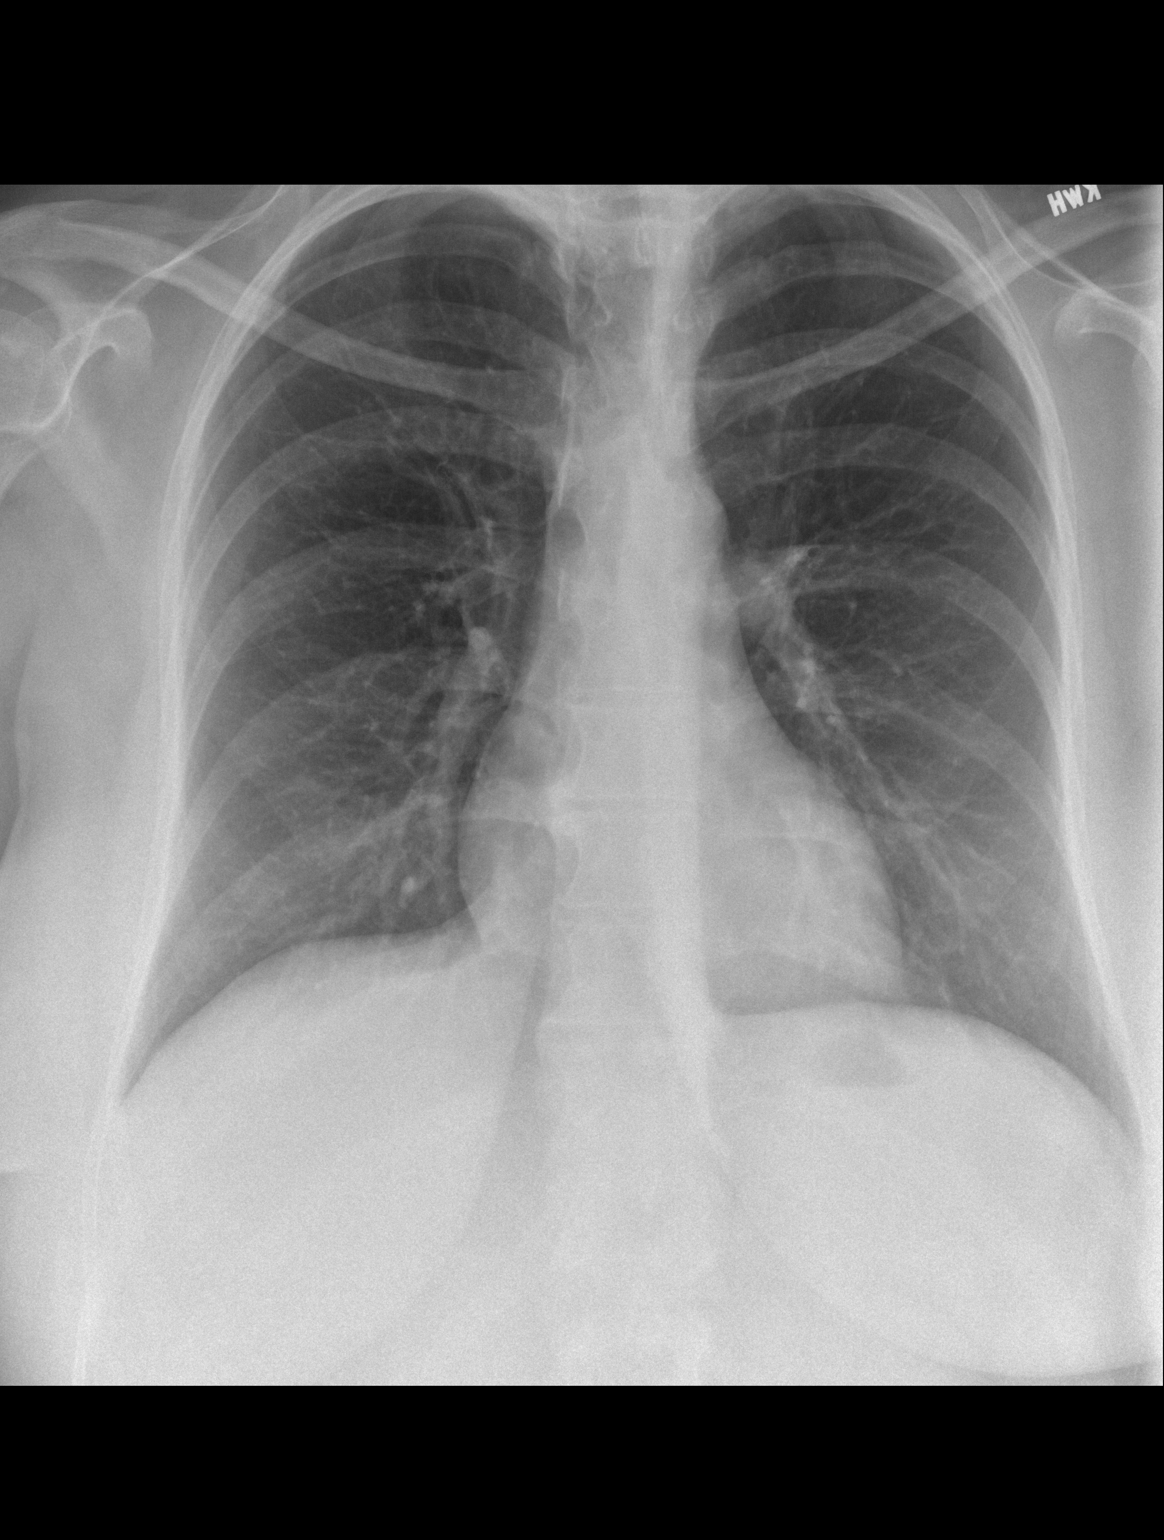

[2 of 2 positions shown; findings below may reference images not displayed]

FINDINGS: Prior: 06/20/2023.
The lungs are clear without focal interstitial process, consolidation, or effusion. Cardiomediastinal contours are within normal limits without pneumothorax. The imaged bony thorax is unremarkable.
IMPRESSION: No acute cardiopulmonary abnormality.
Is the patient pregnant?
No

## 2023-09-28 IMAGING — CT CT ABDOMEN PELVIS WITH CONTRAST
2 of 4 series · 17 of 46 positions shown, 19 images · IV contrast (agent unspecified)
Comparison: 06/20/2023
COMMENT:  Unless the patient's specific circumstances suggest otherwise, any liver lesion 0.5 cm or less, any cystic kidney lesion less than 1.0 cm, and/or any adrenal lesion 1.0 cm or less not otherwise characterized in this report as possessing suspicious or indeterminate imaging features is/are most likely to be benign and do not require follow-up imaging or biopsy.

Per patient, left and right sided abdominal pain
FINAL REPORT:
CT ABDOMEN PELVIS WITH CONTRAST
TECHNIQUE: CT images through the abdomen and pelvis were obtained with intravenous and oral contrast. Dose reduction techniques were utilized for this examination.
CLINICAL INDICATION:  Flank pain, kidney stone suspected.

[Series 3: thins · axial · 0.76mm/px · z∈[-438,+17]mm · 14 of 406 slices shown, 16 images]
[im 21/406  soft-tissue]
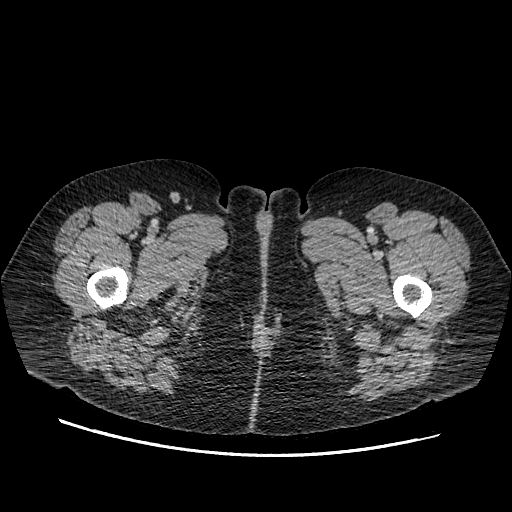
[im 21/406  bone]
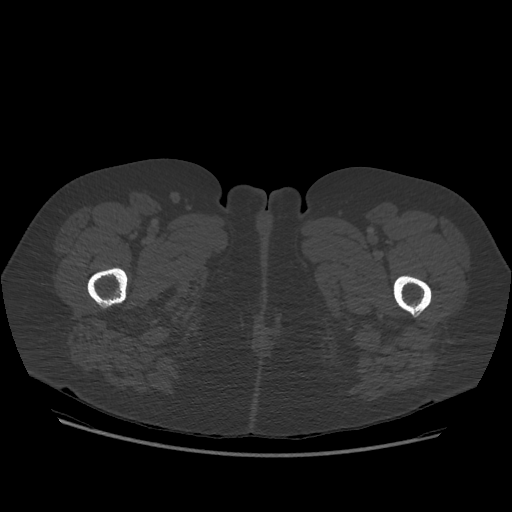
[im 61/406  soft-tissue]
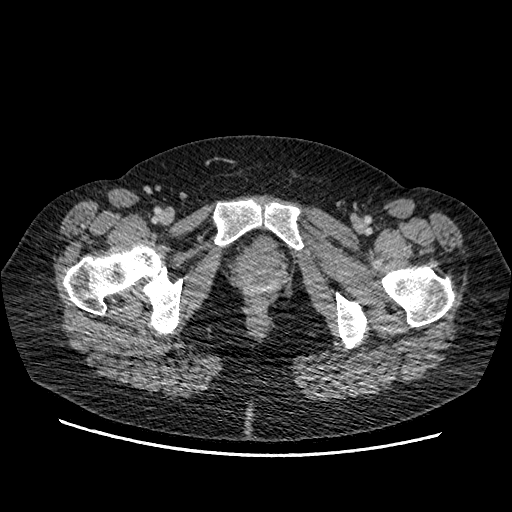
[im 82/406  soft-tissue]
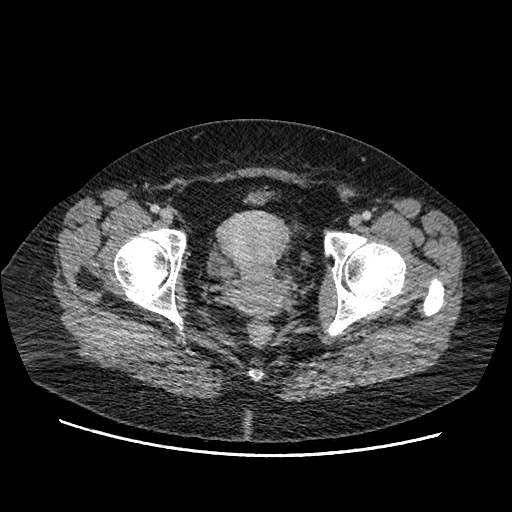
[im 102/406  soft-tissue]
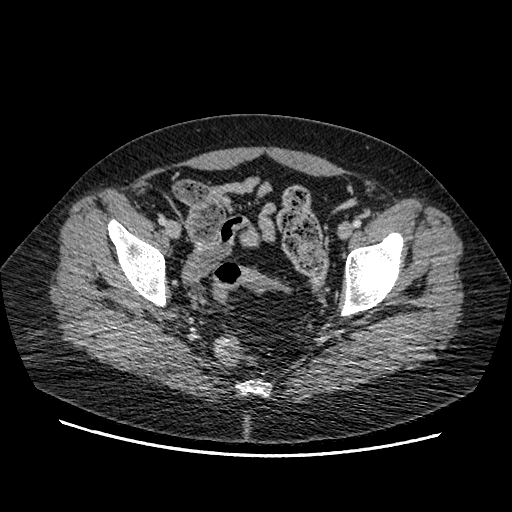
[im 142/406  soft-tissue]
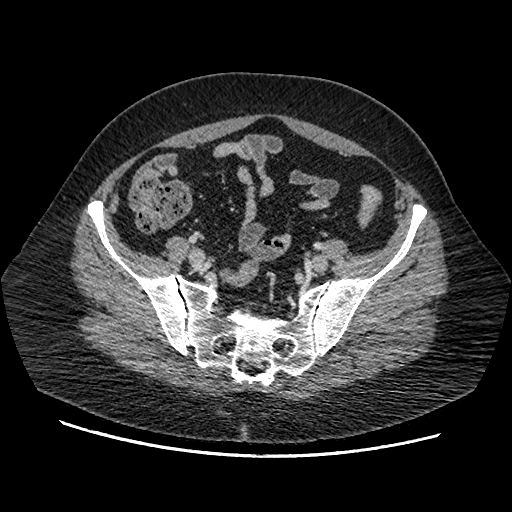
[im 163/406  soft-tissue]
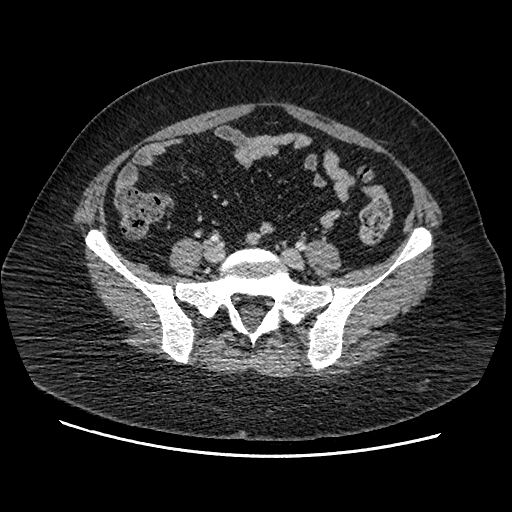
[im 183/406  soft-tissue]
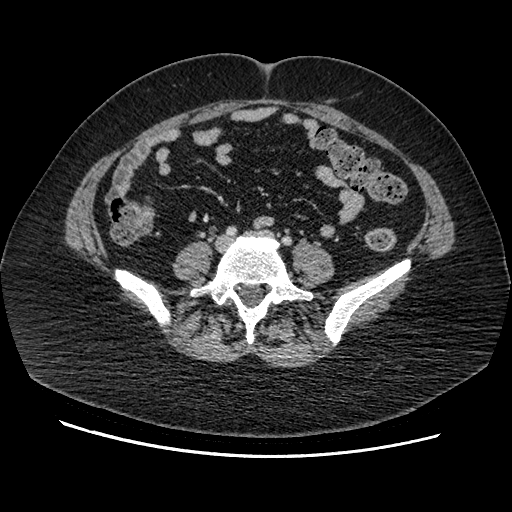
[im 223/406  soft-tissue]
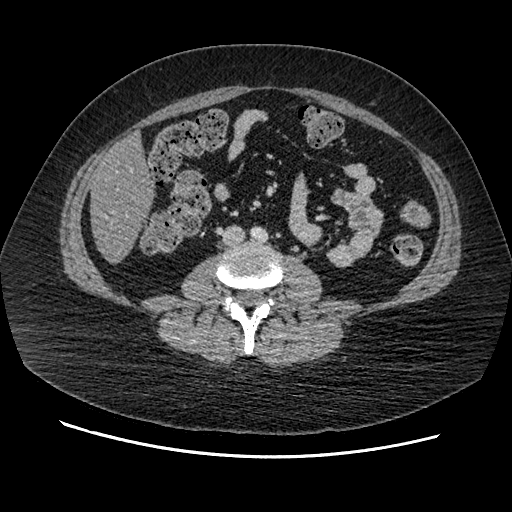
[im 244/406  soft-tissue]
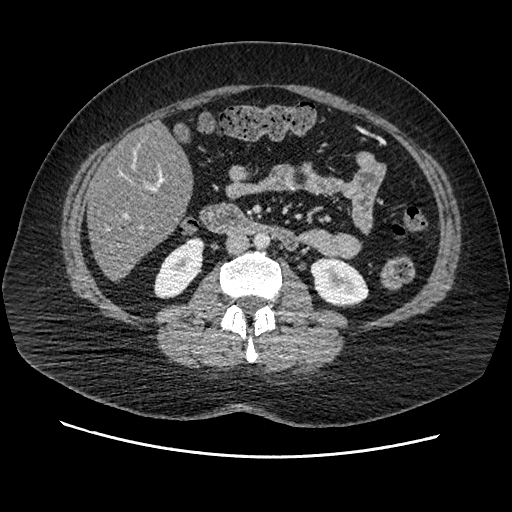
[im 244/406  bone]
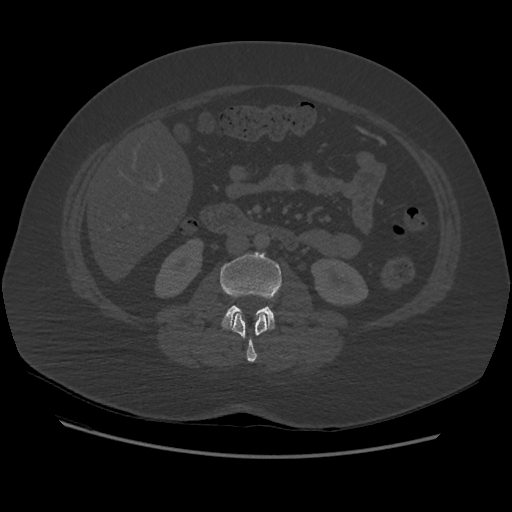
[im 264/406  soft-tissue]
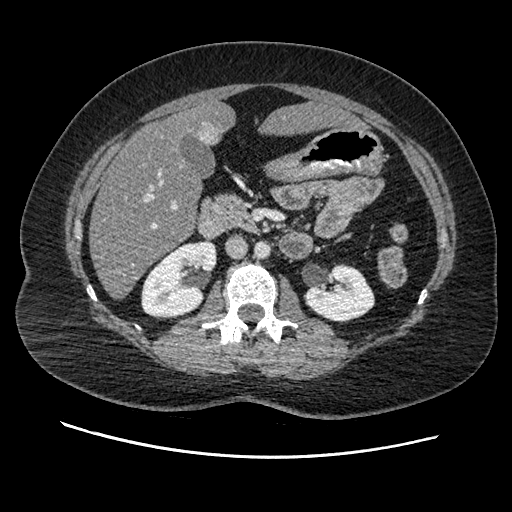
[im 304/406  soft-tissue]
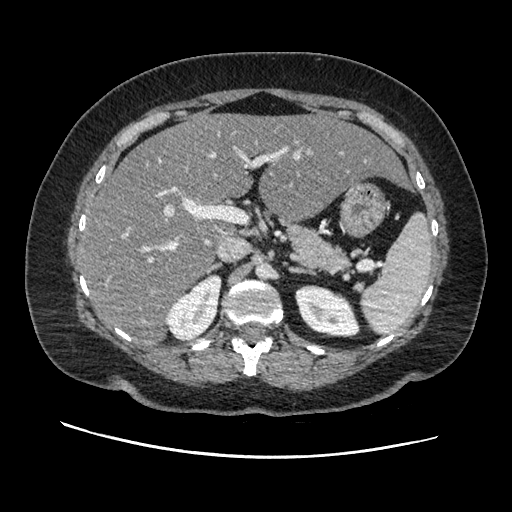
[im 325/406  soft-tissue]
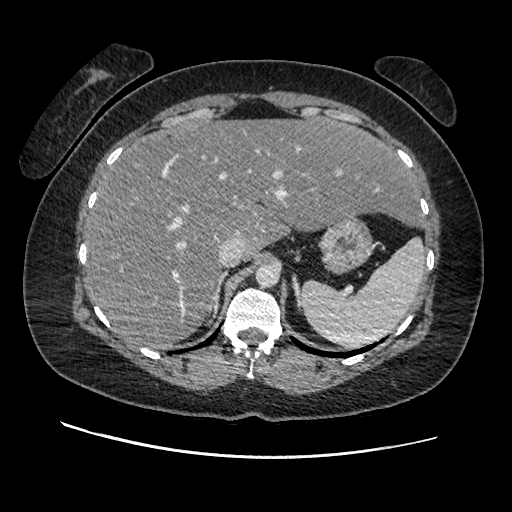
[im 345/406  soft-tissue]
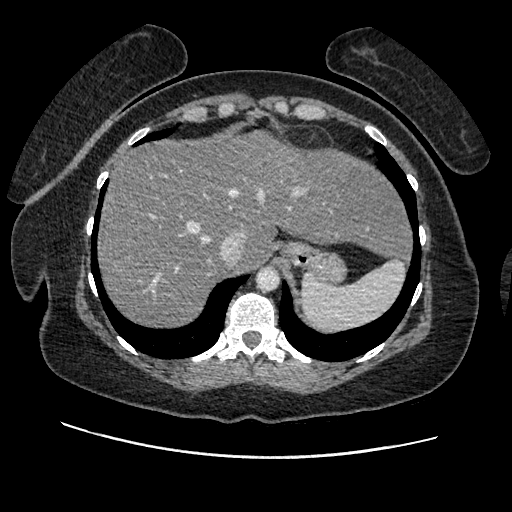
[im 385/406  soft-tissue]
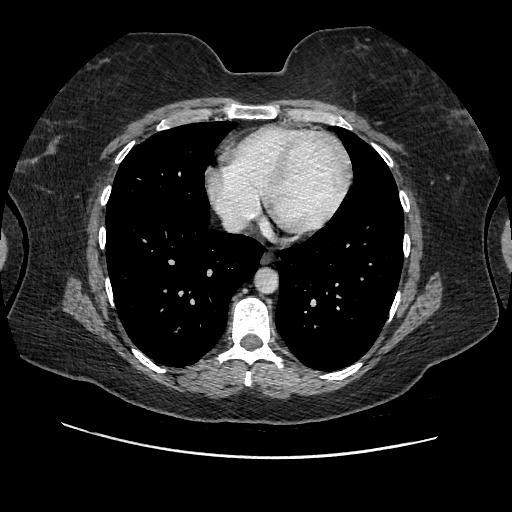

[Series 602: sag standard 2x2 · sagittal · 0.99mm/px · 3 of 195 slices shown]
[im 65/195  soft-tissue]
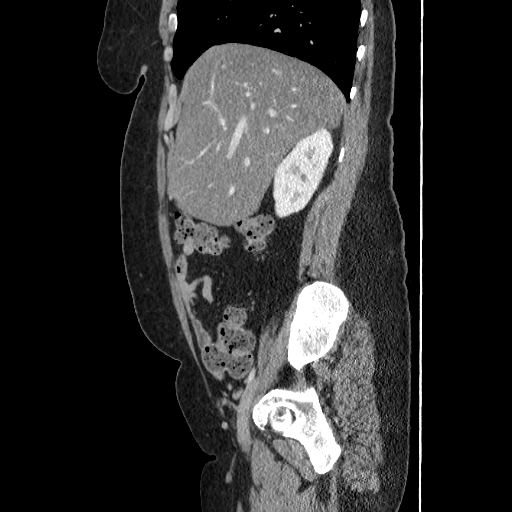
[im 87/195  soft-tissue]
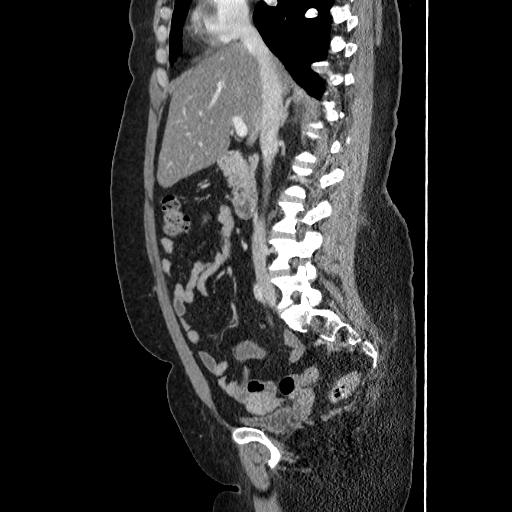
[im 108/195  soft-tissue]
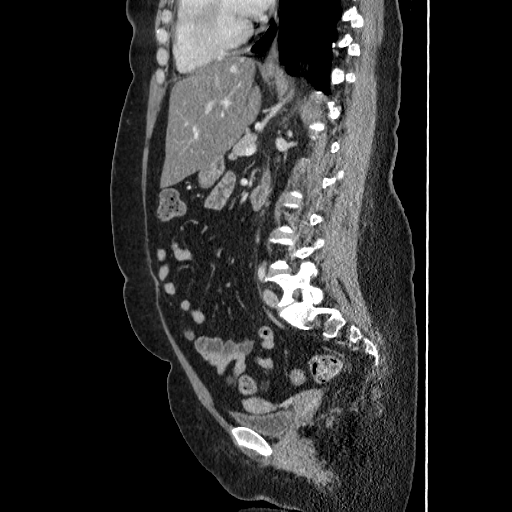

[17 of 46 positions shown; findings below may reference images not displayed]

FINDINGS: Lower Thorax: Unremarkable.
Liver: Marked hepatic steatosis.
Gallbladder/Biliary Tree: No calcified gallstones or biliary ductal dilation.
Spleen: Unremarkable.
Pancreas: Unremarkable.
Adrenal Glands: Unremarkable.
Kidneys/Ureters: Kidneys enhance symmetrically. No hydronephrosis.
Gastrointestinal: No bowel obstruction or acute bowel inflammatory changes.
Bladder: Unremarkable.
Pelvic Organs:  Unremarkable.
Lymph Nodes: Unremarkable.
Vessels: Unremarkable.
Peritoneum/Retroperitoneum: Unremarkable.
Bones: Mild degenerative changes of the spine.
Soft tissues: Unremarkable.
IMPRESSION: No acute abnormality in the abdomen or pelvis.
Marked hepatic steatosis.

## 2023-11-30 IMAGING — CT CT ABDOMEN PELVIS WITHOUT CONTRAST
2 of 4 series · 17 of 46 positions shown, 19 images · non-contrast
Comparison: 09/28/2023
COMMENT:  Unless the patient's specific circumstances suggest otherwise, any liver lesion 0.5 cm or less, any cystic kidney lesion less than 1.0 cm, and/or any adrenal lesion 1.0 cm or less not otherwise characterized in this report as possessing suspicious or indeterminate imaging features is/are most likely to be benign and do not require follow-up imaging or biopsy.

FINAL REPORT:
CT ABDOMEN PELVIS WITHOUT CONTRAST
TECHNIQUE: CT images through the abdomen and pelvis were obtained without intravenous contrast.
All CT scans at this facility use dose modulation and/or weight based dosing when appropriate to reduce radiation dose.
CLINICAL INDICATION:  Flank pain, kidney stone suspected.

[Series 3: thins · axial · 0.78mm/px · z∈[-450,+16]mm · 14 of 812 slices shown, 16 images]
[im 33/812  soft-tissue]
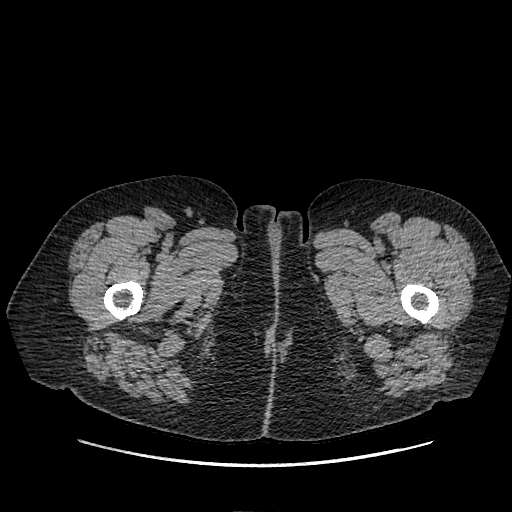
[im 33/812  bone]
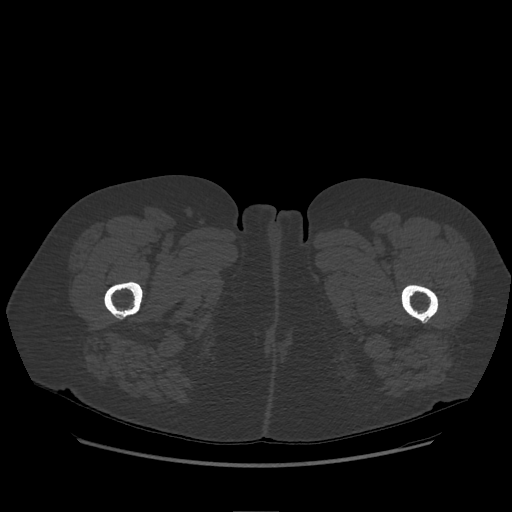
[im 98/812  soft-tissue]
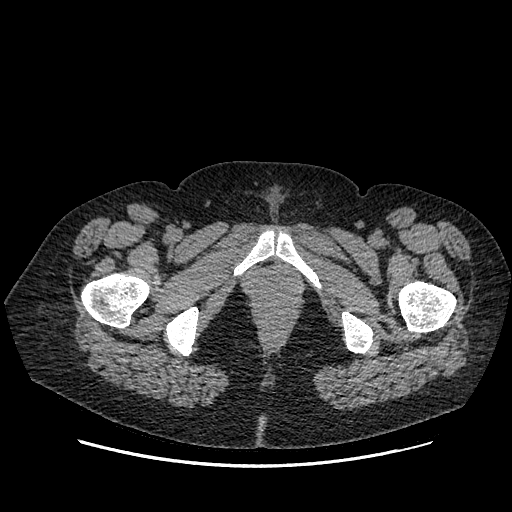
[im 163/812  soft-tissue]
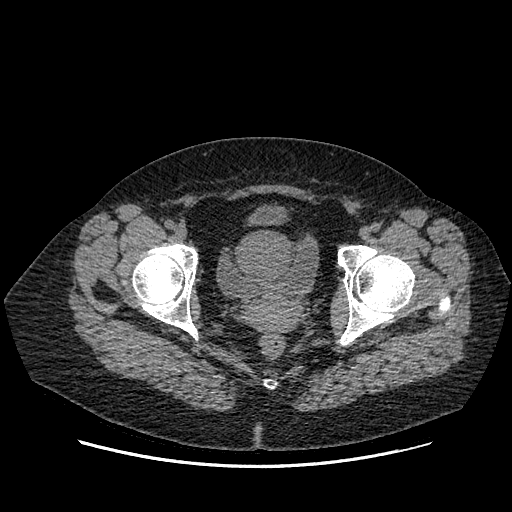
[im 228/812  soft-tissue]
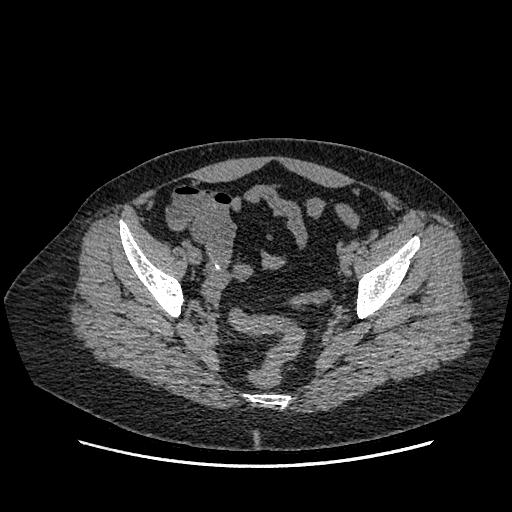
[im 260/812  soft-tissue]
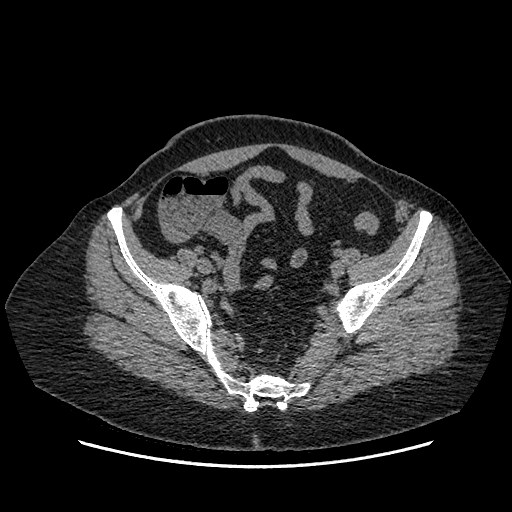
[im 325/812  soft-tissue]
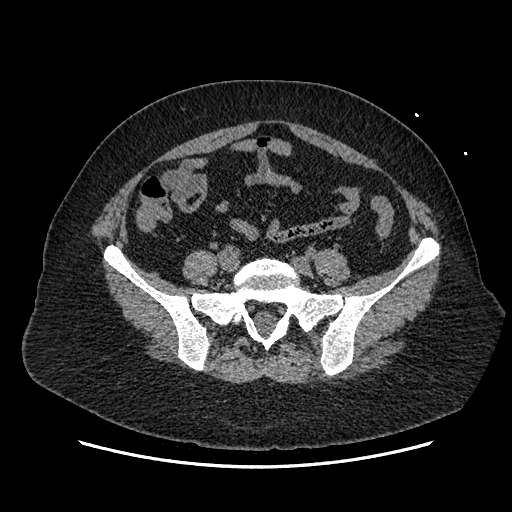
[im 390/812  soft-tissue]
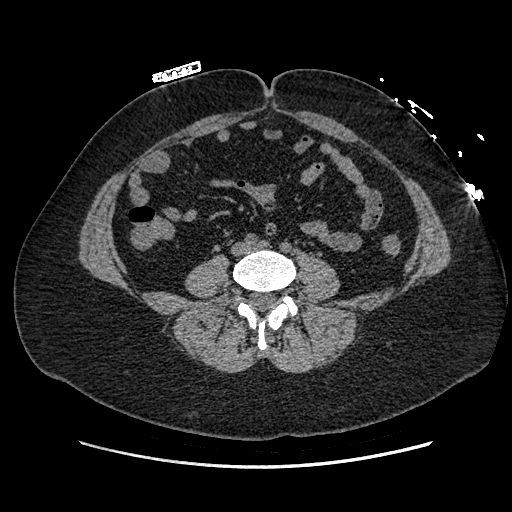
[im 422/812  soft-tissue]
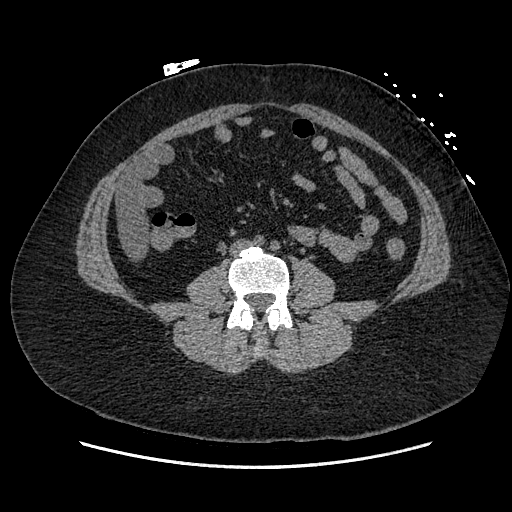
[im 487/812  soft-tissue]
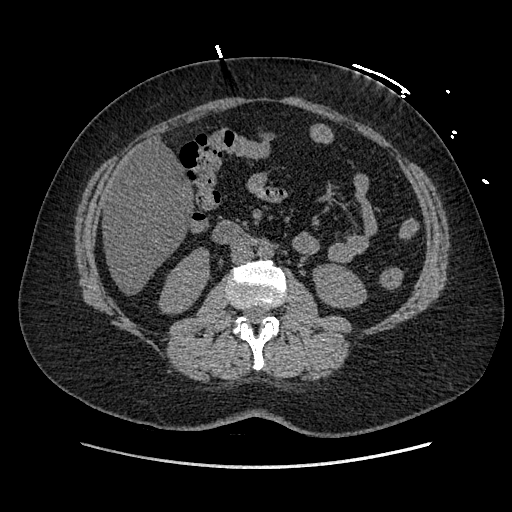
[im 487/812  bone]
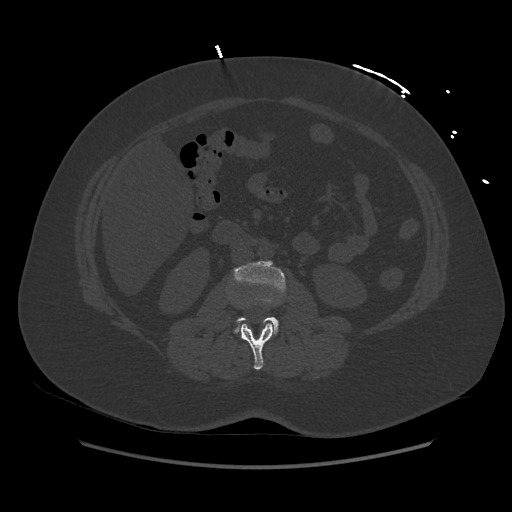
[im 552/812  soft-tissue]
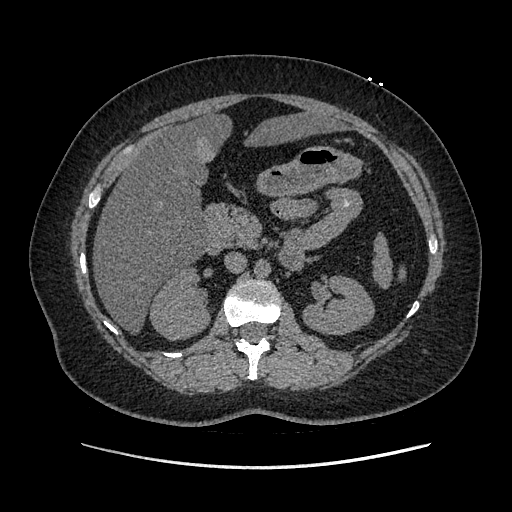
[im 617/812  soft-tissue]
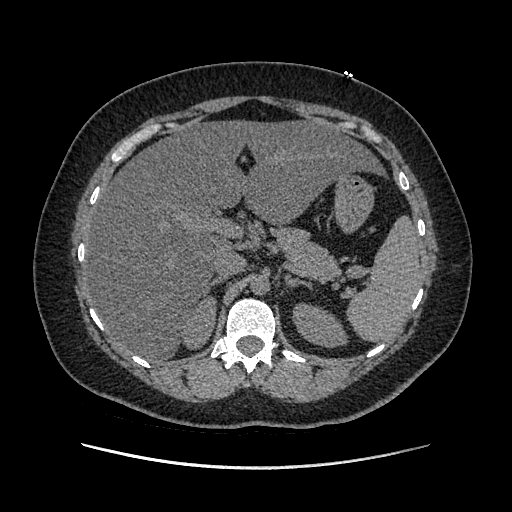
[im 649/812  soft-tissue]
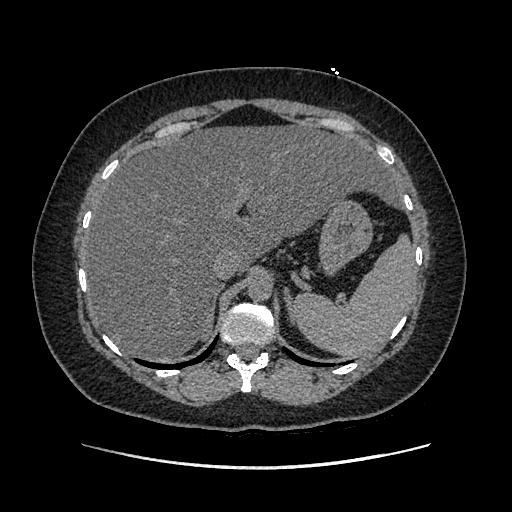
[im 714/812  soft-tissue]
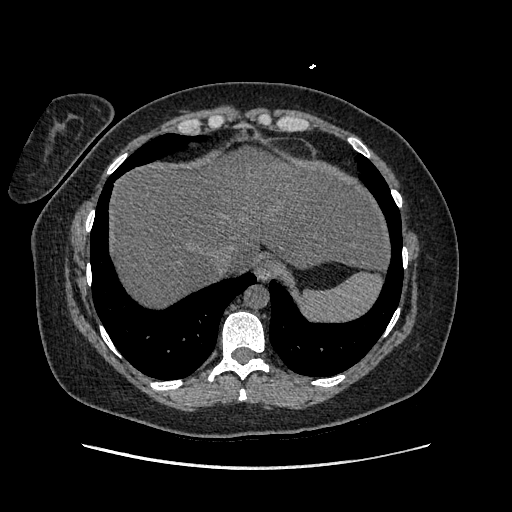
[im 779/812  soft-tissue]
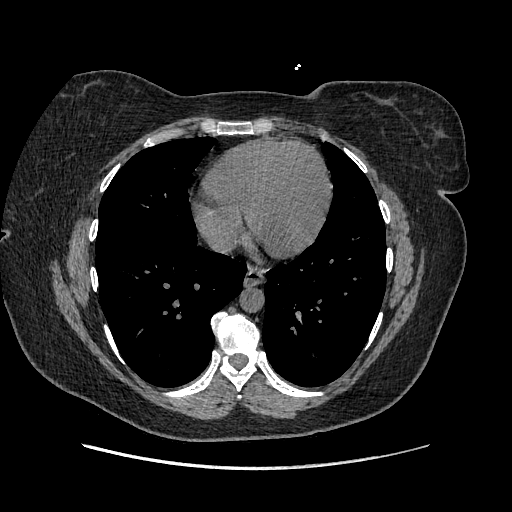

[Series 602: sag standard 2x2 · sagittal · 0.99mm/px · 3 of 200 slices shown]
[im 67/200  soft-tissue]
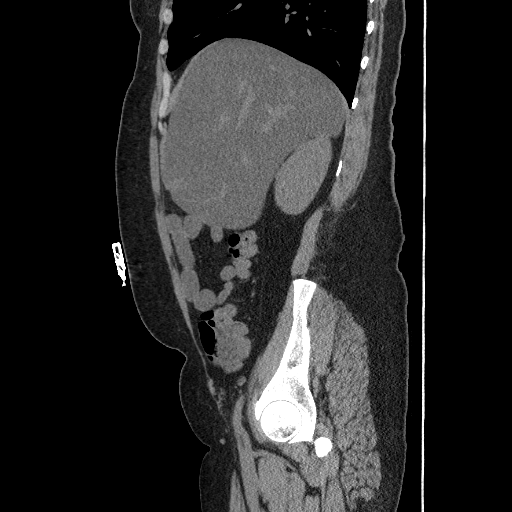
[im 89/200  soft-tissue]
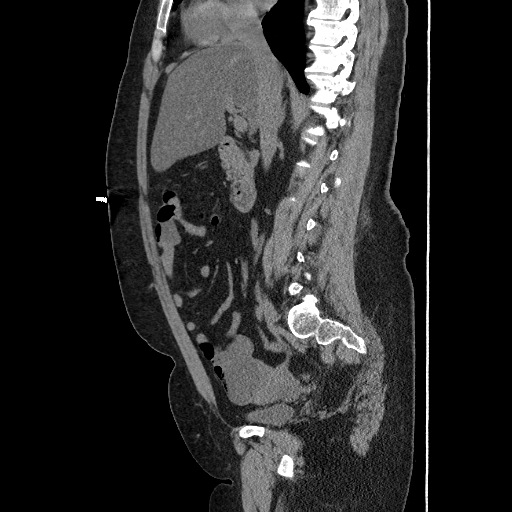
[im 111/200  soft-tissue]
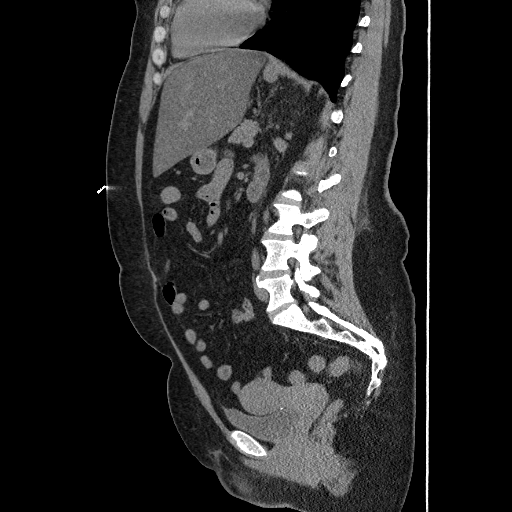

[17 of 46 positions shown; findings below may reference images not displayed]

FINDINGS: Lower Thorax: Normal.
Liver: Diffuse hepatic steatosis.
Gallbladder/Biliary Tree: Normal.
Spleen: Normal.
Pancreas: Normal.
Adrenal Glands: Normal.
Kidneys/Ureters: Normal.
Gastrointestinal: Normal.
Bladder: Normal.
Pelvic Organs: A 3.0 cm right adnexal cystic lesion is likely a simple cyst.
Lymph Nodes: Normal.
Vessels: Normal.
Peritoneum/Retroperitoneum: Normal.
Bones: No acute osseous findings.
Soft tissues: Normal.
IMPRESSION: 1. No acute abnormality in the abdomen and pelvis.
2. Diffuse hepatic steatosis.
Is the patient pregnant?
Waiting for Lab Result

## 2024-01-07 IMAGING — DX XR CHEST 1 VIEW
1 series · 1 of 1 positions shown · non-contrast
Comparison: none

Has been having chest pain since this afternoon. She does have a cardiac history
FINAL REPORT:
XR CHEST 1 VIEW
chest pain
Exam: Portable chest.

[AP]
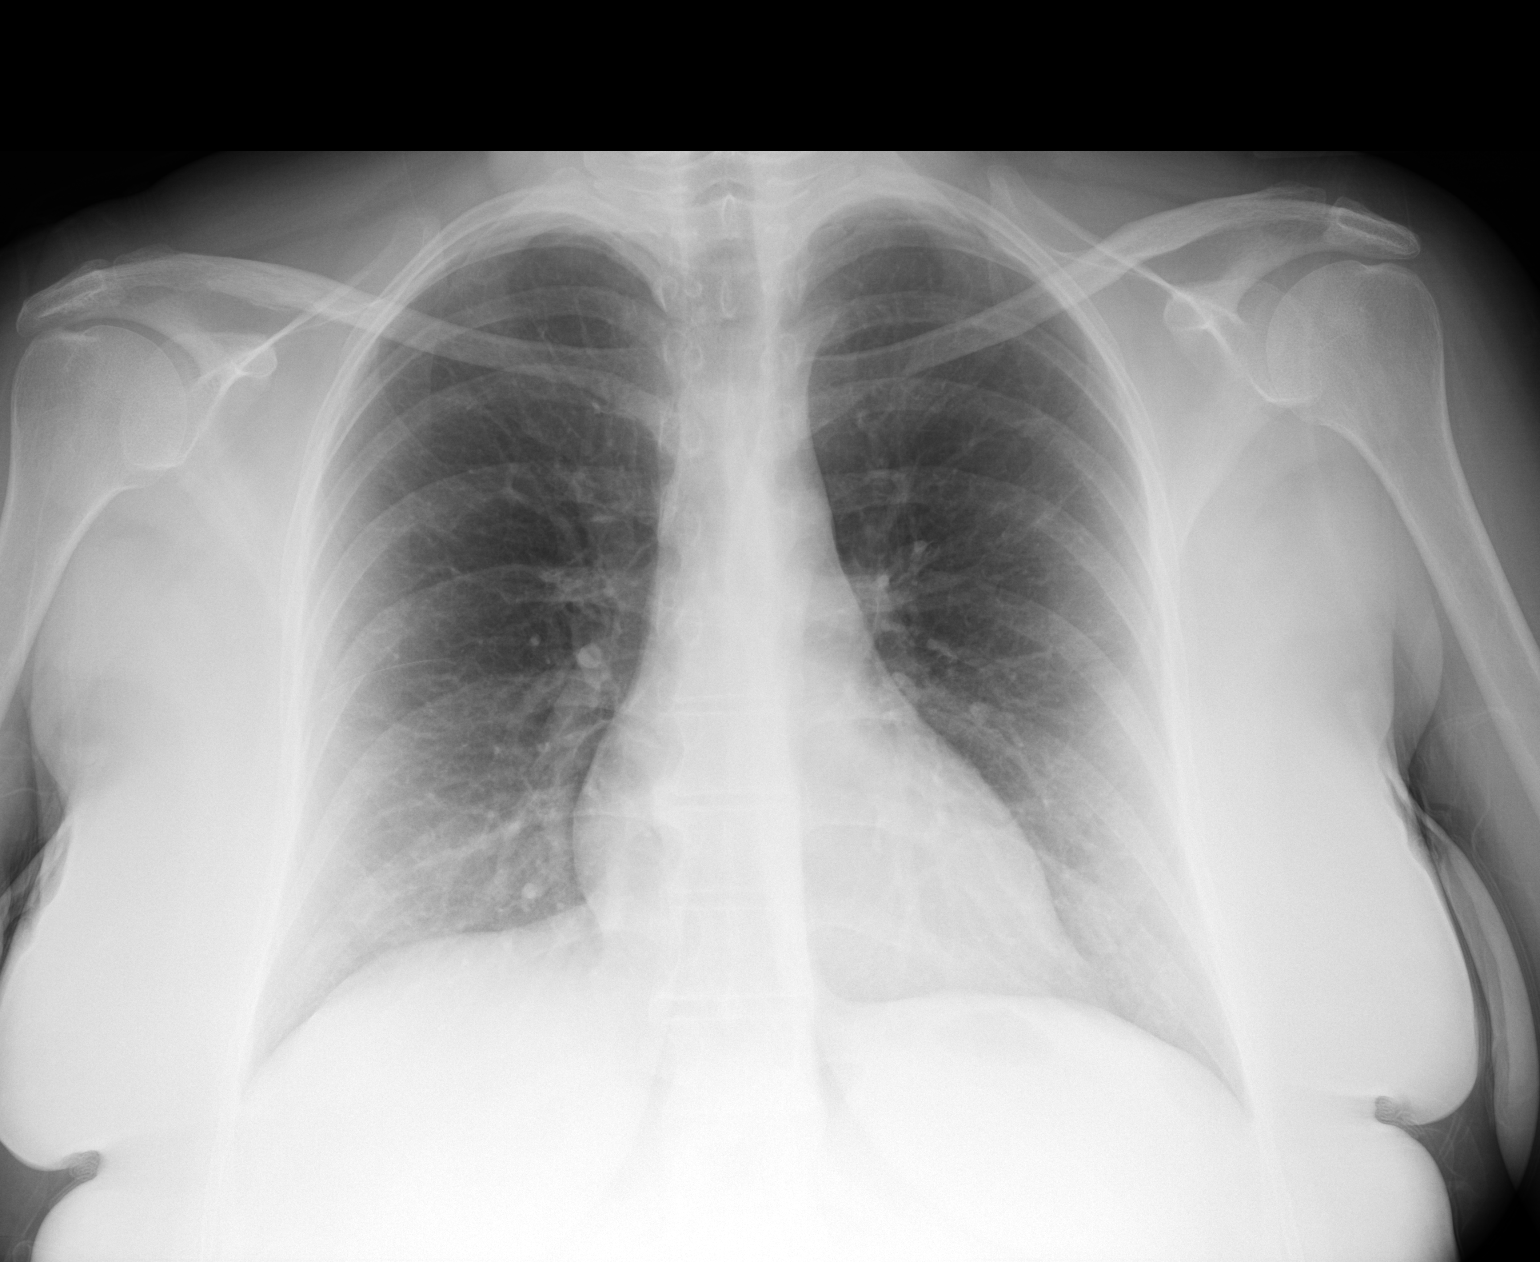

[1 of 1 positions shown; findings below may reference images not displayed]

FINDINGS: Lungs are clear. Heart size within normal limits. Bony structures are
unremarkable.
IMPRESSION: No acute process.
[DATE]
Is the patient pregnant?
No

ADDENDUM:
I personally reviewed the images and the associated interpretation. All findings and reported details remain unchanged from the original report.
FINAL REPORT:
XR CHEST 1 VIEW
chest pain
Exam: Portable chest.
FINDINGS: Lungs are clear. Heart size within normal limits. Bony structures are
unremarkable.
IMPRESSION: No acute process.
[DATE]
Is the patient pregnant?
No

## 2024-01-15 IMAGING — CR XR CHEST 1 VIEW
1 series · 1 of 1 positions shown · non-contrast
Comparison: 01/07/2024.

dizziness
FINAL REPORT:
EXAM: Portable chest x-ray
INDICATION: weakenss. . .
TECHNIQUE: Single, portable, AP digital radiograph of the chest.

[AP]
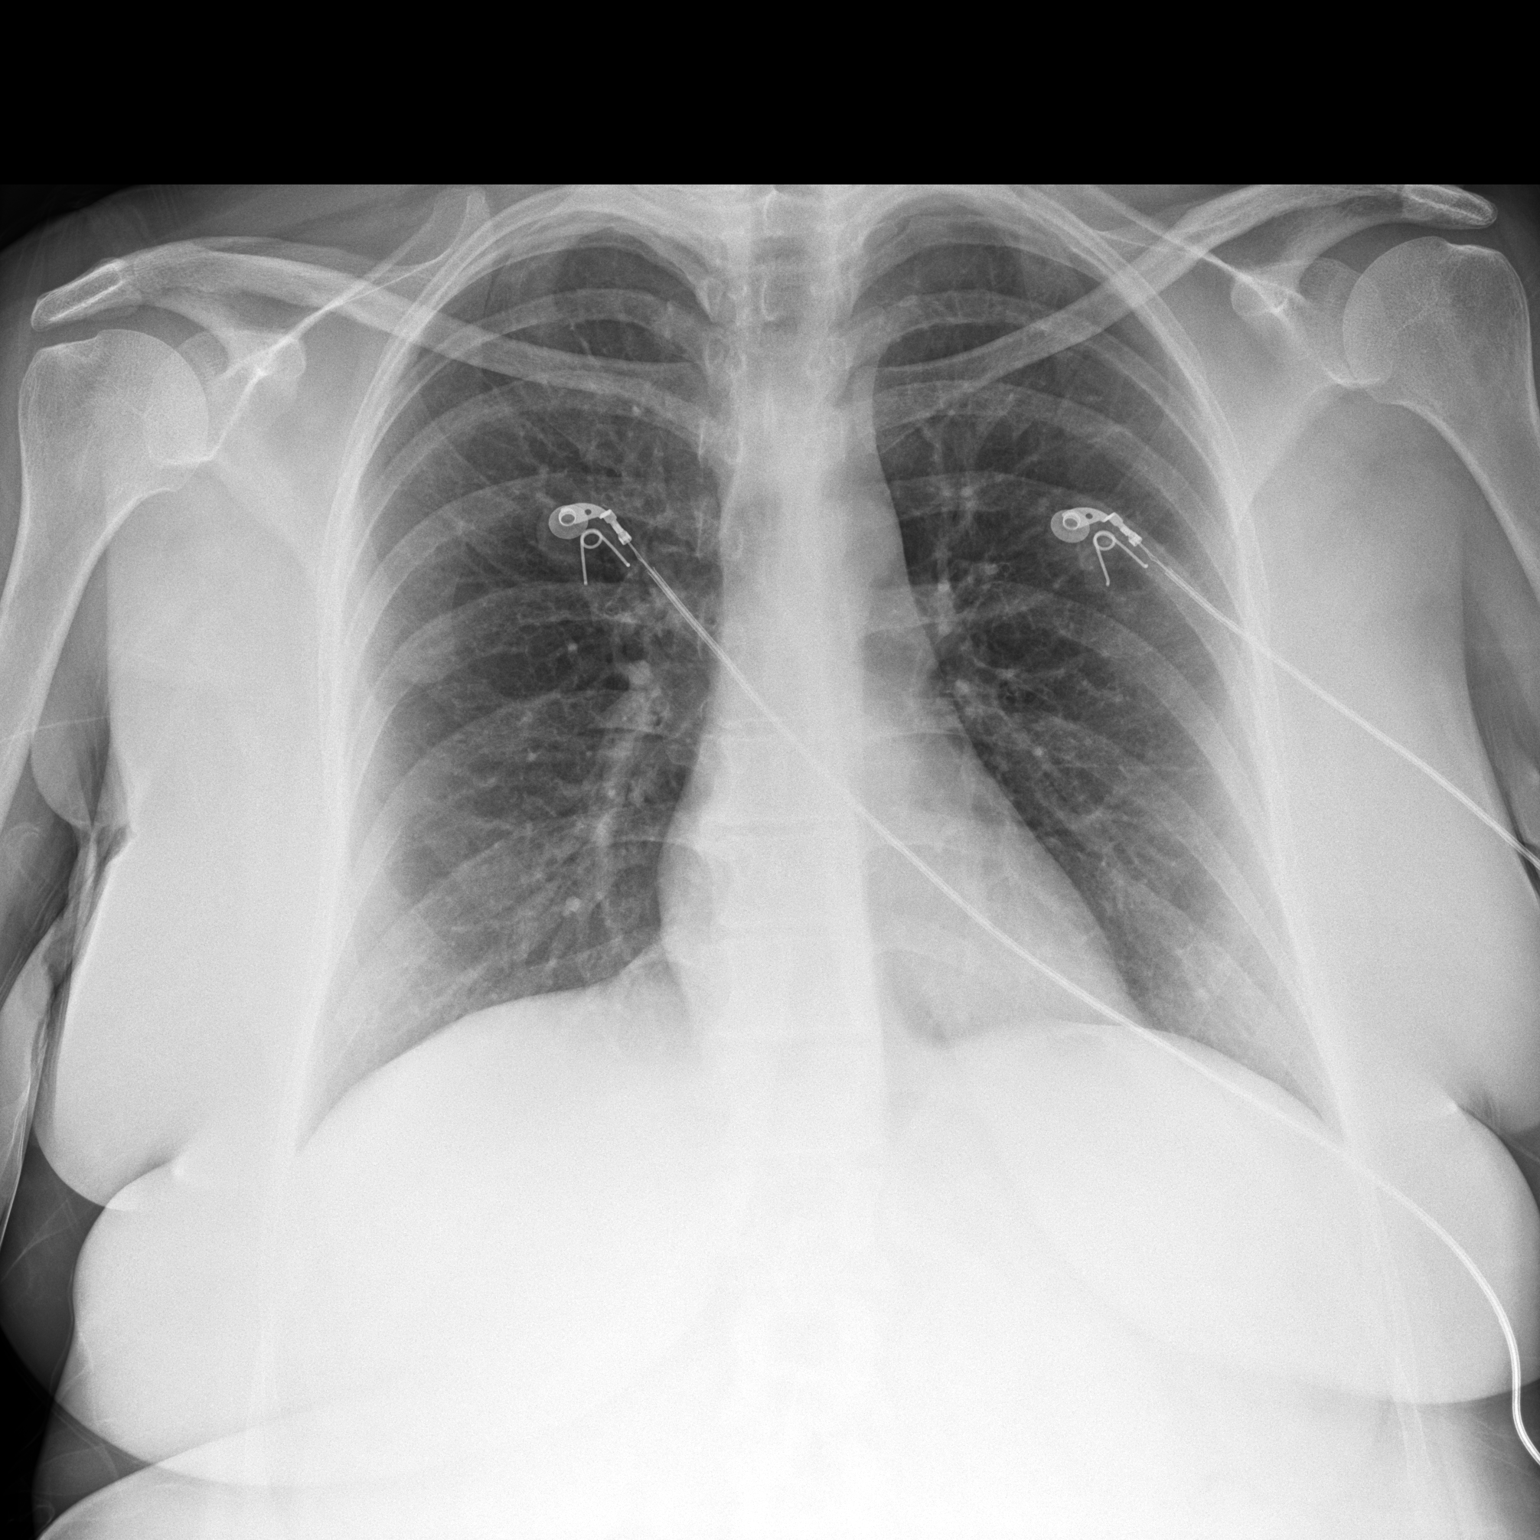

[1 of 1 positions shown; findings below may reference images not displayed]

FINDINGS: No focal consolidation, pleural effusion, or pneumothorax. Cardiomediastinal silhouette is unremarkable. No pulmonary edema pattern.
IMPRESSION: No features of an acute intrathoracic process.
Is the patient pregnant?
Unknown

## 2024-05-01 IMAGING — MR MRI BRAIN WITH AND WITHOUT CONTRAST
15 series · 48 of 48 positions shown · non-contrast
Comparison: No prior studies are available for comparison.

FINAL REPORT:
EXAM: MRI BRAIN WITH AND WITHOUT CONTRAST
HISTORY: Confusion, word finding issues..
TECHNIQUE: Multiplanar, multisequential images of the brain were obtained before and after the administration of intravenous contrast.

[Series 2: survey_mpr_sag · sagittal · 1.6mm · 1.60mm/px · 1 of 5 slices shown]
[im 1/5]
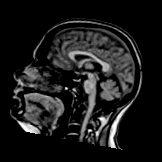

[Series 3: survey_mpr_cor · coronal · 1.6mm · 1.60mm/px · 1 of 3 slices shown]
[im 1/3]
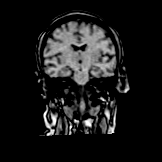

[Series 4: survey_mpr_(person_name) · axial · 1.6mm · 1.60mm/px · 1 of 3 slices shown]
[im 1/3]
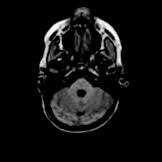

[Series 5: T1 · sagittal · 4.0mm · 0.72mm/px · 1 of 25 slices shown (1 of 4)]
[im 1/25]
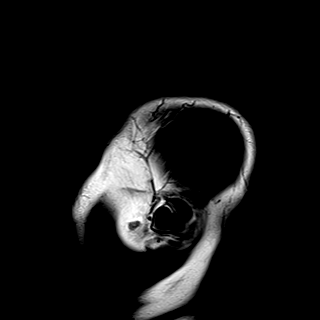

[Series 6: ax dwi_tracew · axial · 4.0mm · 0.60mm/px · 1 of 30 slices shown]
[im 1/30]
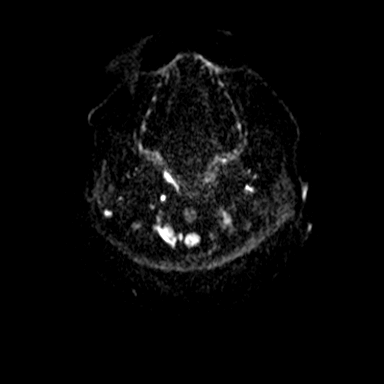

[Series 7: ax dwi_adc · axial · 4.0mm · 0.60mm/px · 1 of 30 slices shown]
[im 1/30]
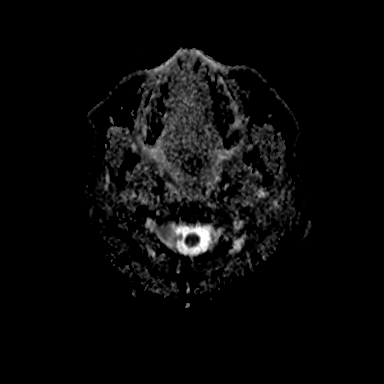

[Series 8: ax dwi_exp · axial · 4.0mm · 0.60mm/px · 1 of 30 slices shown]
[im 1/30]
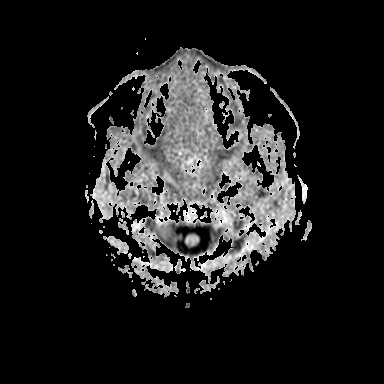

[Series 9: T1 · axial · 4.0mm · 0.72mm/px · 1 of 30 slices shown (2 of 4)]
[im 1/30]
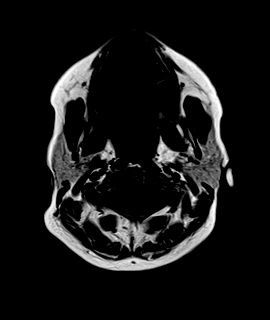

[Series 10: FLAIR · axial · 4.0mm · 0.72mm/px · 1 of 30 slices shown]
[im 1/30]
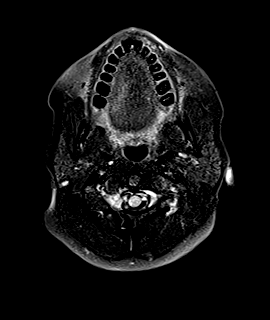

[Series 11: GRE · axial · 4.0mm · 0.45mm/px · 1 of 30 slices shown]
[im 1/30]
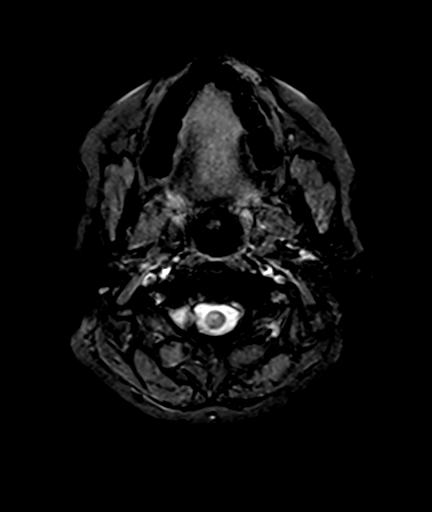

[Series 12: T2 fat-sat · axial · 4.0mm · 0.51mm/px · z∈[-68,+70]mm · 2 of 30 slices shown]
[im 1/30]
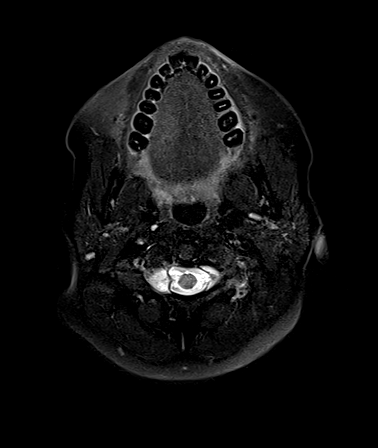
[im 30/30]
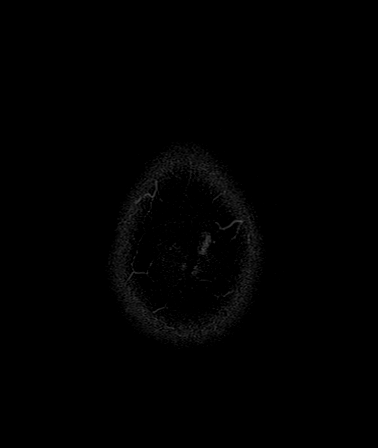

[Series 13: T1 post-contrast · axial · 4.0mm · 0.72mm/px · z∈[-68,+70]mm · 2 of 30 slices shown (1 of 2)]
[im 1/30]
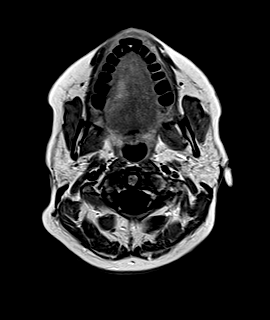
[im 30/30]
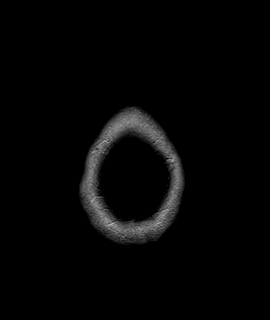

[Series 14: T1 post-contrast · axial · 1.0mm · 0.97mm/px · z∈[-80,+73]mm · 9 of 154 slices shown (2 of 2)]
[im 1/154]
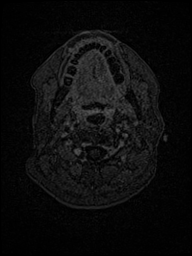
[im 20/154]
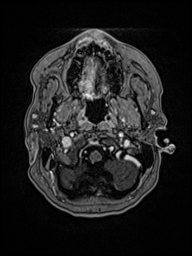
[im 39/154]
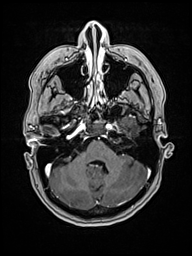
[im 58/154]
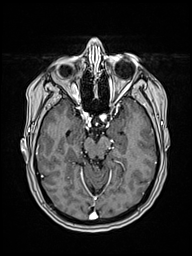
[im 77/154]
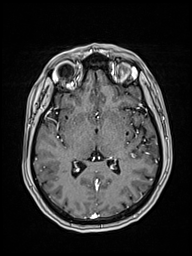
[im 96/154]
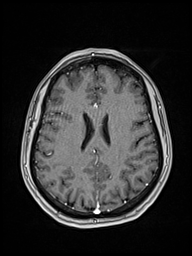
[im 115/154]
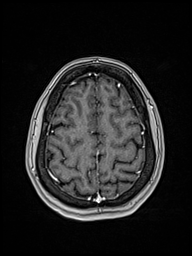
[im 134/154]
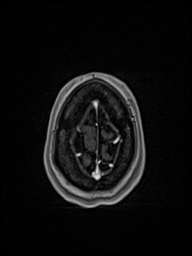
[im 154/154]
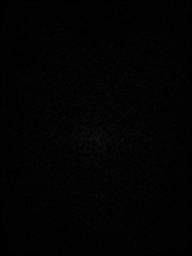

[Series 15: T1 · sagittal · 1.0mm · 0.97mm/px · 11 of 190 slices shown (3 of 4)]
[im 1/190]
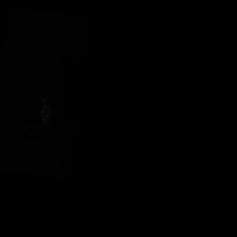
[im 19/190]
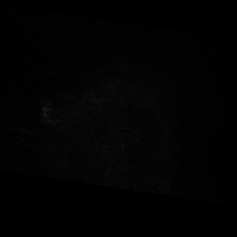
[im 38/190]
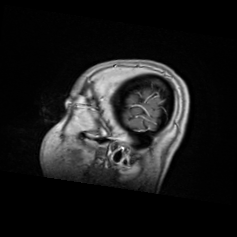
[im 57/190]
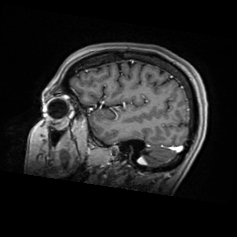
[im 76/190]
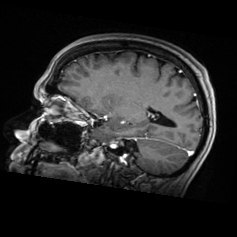
[im 95/190]
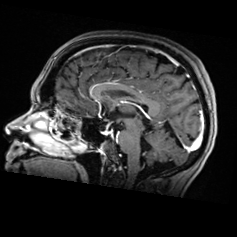
[im 114/190]
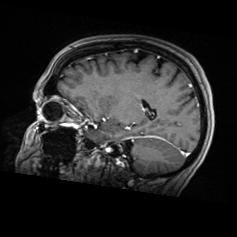
[im 133/190]
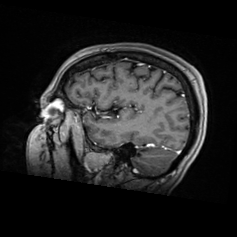
[im 152/190]
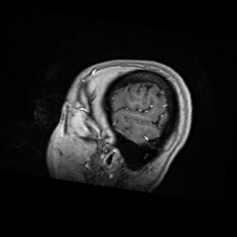
[im 171/190]
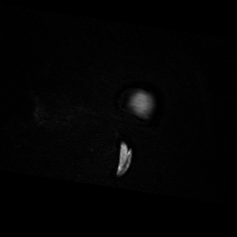
[im 190/190]
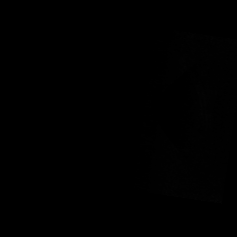

[Series 16: T1 · coronal · 1.0mm · 0.97mm/px · 14 of 247 slices shown (4 of 4)]
[im 1/247]
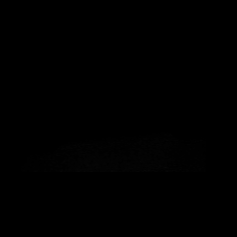
[im 19/247]
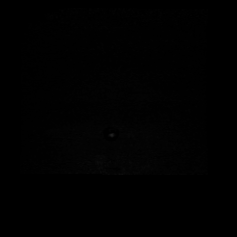
[im 38/247]
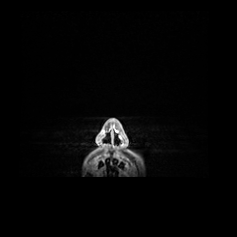
[im 57/247]
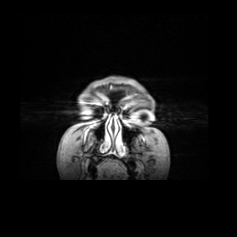
[im 76/247]
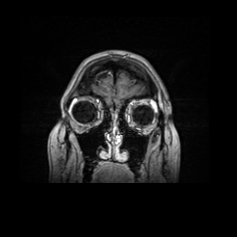
[im 95/247]
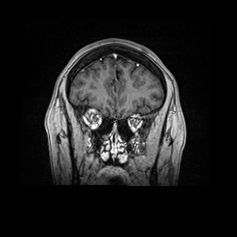
[im 114/247]
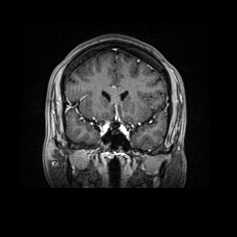
[im 133/247]
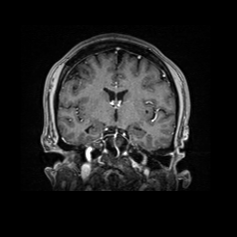
[im 152/247]
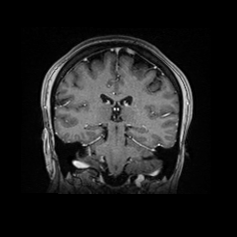
[im 171/247]
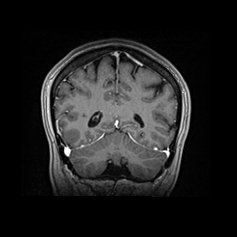
[im 190/247]
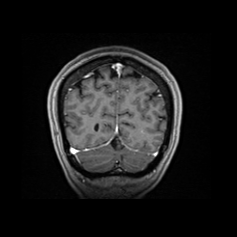
[im 209/247]
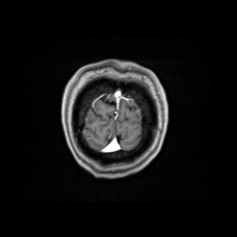
[im 228/247]
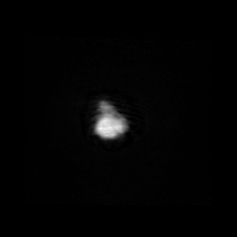
[im 247/247]
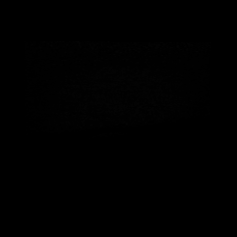

[48 of 48 positions shown; findings below may reference images not displayed]

FINDINGS: The sulci, ventricles, and basal cisterns are within normal limits for age. There is no intracranial hemorrhage, mass effect, or midline shift. No extra-axial fluid collections are identified. There are no significant areas of abnormal parenchymal signal and there is no restricted diffusion to suggest acute infarction.
There are no areas of abnormal intracranial enhancement.
The visualized portions of the paranasal sinuses and mastoid air cells are clear. Patent appearing flow-voids are identified in the visualized portions of the intracranial carotid and vertebrobasilar systems.
IMPRESSION: 1. No acute MRI findings identified within the brain.
2. Essentially normal brain MRI.
Is the patient pregnant?
No
